# Patient Record
Sex: Male | Born: 1935 | Race: White | Hispanic: No | Marital: Married | Smoking: Former smoker
Health system: Southern US, Community
[De-identification: ages and names within clinical notes are randomized; demographics above are authoritative.]

## PROBLEM LIST (undated history)

## (undated) DIAGNOSIS — D649 Anemia, unspecified: Secondary | ICD-10-CM

## (undated) DIAGNOSIS — J209 Acute bronchitis, unspecified: Secondary | ICD-10-CM

## (undated) DIAGNOSIS — J449 Chronic obstructive pulmonary disease, unspecified: Secondary | ICD-10-CM

## (undated) DIAGNOSIS — I959 Hypotension, unspecified: Secondary | ICD-10-CM

## (undated) DIAGNOSIS — I2510 Atherosclerotic heart disease of native coronary artery without angina pectoris: Secondary | ICD-10-CM

## (undated) DIAGNOSIS — J8410 Pulmonary fibrosis, unspecified: Secondary | ICD-10-CM

## (undated) DIAGNOSIS — M069 Rheumatoid arthritis, unspecified: Secondary | ICD-10-CM

## (undated) DIAGNOSIS — J438 Other emphysema: Secondary | ICD-10-CM

## (undated) DIAGNOSIS — J4489 Other specified chronic obstructive pulmonary disease: Secondary | ICD-10-CM

## (undated) HISTORY — DX: Other emphysema: J43.8

## (undated) HISTORY — DX: Chronic obstructive pulmonary disease, unspecified: J44.9

## (undated) HISTORY — DX: Atherosclerotic heart disease of native coronary artery without angina pectoris: I25.10

## (undated) HISTORY — PX: OTHER SURGICAL HISTORY: SHX169

## (undated) HISTORY — DX: Acute bronchitis, unspecified: J20.9

## (undated) HISTORY — PX: FOOT SURGERY: SHX648

## (undated) HISTORY — DX: Rheumatoid arthritis, unspecified: M06.9

## (undated) HISTORY — DX: Other specified chronic obstructive pulmonary disease: J44.89

## (undated) HISTORY — DX: Pulmonary fibrosis, unspecified: J84.10

---

## 2000-10-14 ENCOUNTER — Ambulatory Visit: Admission: RE | Admit: 2000-10-14 | Discharge: 2000-10-14 | Payer: Self-pay | Admitting: Critical Care Medicine

## 2000-12-06 ENCOUNTER — Encounter: Admission: RE | Admit: 2000-12-06 | Discharge: 2000-12-06 | Payer: Self-pay

## 2001-01-22 DIAGNOSIS — I2510 Atherosclerotic heart disease of native coronary artery without angina pectoris: Secondary | ICD-10-CM

## 2001-01-22 HISTORY — PX: CORONARY ANGIOPLASTY WITH STENT PLACEMENT: SHX49

## 2001-01-22 HISTORY — DX: Atherosclerotic heart disease of native coronary artery without angina pectoris: I25.10

## 2001-07-24 ENCOUNTER — Inpatient Hospital Stay (HOSPITAL_COMMUNITY): Admission: EM | Admit: 2001-07-24 | Discharge: 2001-07-29 | Payer: Self-pay | Admitting: *Deleted

## 2002-01-22 HISTORY — PX: CARDIAC CATHETERIZATION: SHX172

## 2002-02-25 ENCOUNTER — Ambulatory Visit (HOSPITAL_COMMUNITY): Admission: RE | Admit: 2002-02-25 | Discharge: 2002-02-25 | Payer: Self-pay | Admitting: Cardiology

## 2002-07-07 ENCOUNTER — Ambulatory Visit (HOSPITAL_COMMUNITY): Admission: RE | Admit: 2002-07-07 | Discharge: 2002-07-07 | Payer: Self-pay | Admitting: Internal Medicine

## 2002-07-07 ENCOUNTER — Encounter (HOSPITAL_BASED_OUTPATIENT_CLINIC_OR_DEPARTMENT_OTHER): Payer: Self-pay | Admitting: Internal Medicine

## 2002-09-11 ENCOUNTER — Encounter: Payer: Self-pay | Admitting: Critical Care Medicine

## 2002-09-11 ENCOUNTER — Encounter: Admission: RE | Admit: 2002-09-11 | Discharge: 2002-09-11 | Payer: Self-pay | Admitting: Critical Care Medicine

## 2003-02-13 ENCOUNTER — Emergency Department (HOSPITAL_COMMUNITY): Admission: EM | Admit: 2003-02-13 | Discharge: 2003-02-13 | Payer: Self-pay | Admitting: Emergency Medicine

## 2003-04-09 ENCOUNTER — Encounter: Admission: RE | Admit: 2003-04-09 | Discharge: 2003-04-09 | Payer: Self-pay | Admitting: General Surgery

## 2004-02-09 ENCOUNTER — Ambulatory Visit (HOSPITAL_COMMUNITY): Admission: RE | Admit: 2004-02-09 | Discharge: 2004-02-09 | Payer: Self-pay | Admitting: Orthopedic Surgery

## 2004-04-11 ENCOUNTER — Ambulatory Visit: Payer: Self-pay | Admitting: Critical Care Medicine

## 2004-05-15 ENCOUNTER — Ambulatory Visit: Payer: Self-pay | Admitting: Critical Care Medicine

## 2004-08-17 ENCOUNTER — Ambulatory Visit: Payer: Self-pay | Admitting: Critical Care Medicine

## 2004-10-05 ENCOUNTER — Ambulatory Visit: Payer: Self-pay | Admitting: Critical Care Medicine

## 2004-11-30 ENCOUNTER — Ambulatory Visit: Payer: Self-pay | Admitting: Critical Care Medicine

## 2005-01-26 ENCOUNTER — Ambulatory Visit: Payer: Self-pay | Admitting: Critical Care Medicine

## 2005-03-27 ENCOUNTER — Ambulatory Visit: Payer: Self-pay | Admitting: Internal Medicine

## 2005-04-02 ENCOUNTER — Ambulatory Visit: Payer: Self-pay | Admitting: Internal Medicine

## 2005-04-11 ENCOUNTER — Ambulatory Visit: Payer: Self-pay | Admitting: Critical Care Medicine

## 2005-05-01 ENCOUNTER — Ambulatory Visit: Payer: Self-pay | Admitting: Critical Care Medicine

## 2005-05-15 ENCOUNTER — Ambulatory Visit: Payer: Self-pay | Admitting: Pulmonary Disease

## 2005-07-10 ENCOUNTER — Ambulatory Visit: Payer: Self-pay | Admitting: Critical Care Medicine

## 2005-07-30 ENCOUNTER — Ambulatory Visit: Payer: Self-pay | Admitting: Critical Care Medicine

## 2005-08-16 ENCOUNTER — Ambulatory Visit: Payer: Self-pay | Admitting: Critical Care Medicine

## 2005-09-18 ENCOUNTER — Ambulatory Visit: Payer: Self-pay | Admitting: Critical Care Medicine

## 2005-11-20 ENCOUNTER — Ambulatory Visit: Payer: Self-pay | Admitting: Critical Care Medicine

## 2006-02-22 ENCOUNTER — Ambulatory Visit: Payer: Self-pay | Admitting: Critical Care Medicine

## 2006-11-08 DIAGNOSIS — J449 Chronic obstructive pulmonary disease, unspecified: Secondary | ICD-10-CM

## 2006-11-08 DIAGNOSIS — M069 Rheumatoid arthritis, unspecified: Secondary | ICD-10-CM | POA: Insufficient documentation

## 2006-11-08 DIAGNOSIS — J849 Interstitial pulmonary disease, unspecified: Secondary | ICD-10-CM | POA: Insufficient documentation

## 2006-12-26 ENCOUNTER — Ambulatory Visit: Payer: Self-pay | Admitting: Critical Care Medicine

## 2007-04-24 ENCOUNTER — Encounter: Payer: Self-pay | Admitting: Critical Care Medicine

## 2007-05-07 ENCOUNTER — Encounter: Admission: RE | Admit: 2007-05-07 | Discharge: 2007-05-07 | Payer: Self-pay | Admitting: Cardiology

## 2007-05-08 ENCOUNTER — Ambulatory Visit (HOSPITAL_COMMUNITY): Admission: RE | Admit: 2007-05-08 | Discharge: 2007-05-08 | Payer: Self-pay | Admitting: Cardiology

## 2007-07-11 ENCOUNTER — Ambulatory Visit: Payer: Self-pay | Admitting: Critical Care Medicine

## 2007-07-11 DIAGNOSIS — I2510 Atherosclerotic heart disease of native coronary artery without angina pectoris: Secondary | ICD-10-CM | POA: Insufficient documentation

## 2007-07-14 ENCOUNTER — Ambulatory Visit: Payer: Self-pay | Admitting: Internal Medicine

## 2007-07-14 ENCOUNTER — Encounter: Payer: Self-pay | Admitting: Critical Care Medicine

## 2007-07-18 ENCOUNTER — Ambulatory Visit: Admission: RE | Admit: 2007-07-18 | Discharge: 2007-07-18 | Payer: Self-pay | Admitting: Critical Care Medicine

## 2007-07-18 ENCOUNTER — Encounter: Payer: Self-pay | Admitting: Internal Medicine

## 2007-07-21 ENCOUNTER — Encounter: Payer: Self-pay | Admitting: Critical Care Medicine

## 2007-07-23 ENCOUNTER — Encounter: Payer: Self-pay | Admitting: Critical Care Medicine

## 2007-08-22 ENCOUNTER — Ambulatory Visit: Payer: Self-pay | Admitting: Critical Care Medicine

## 2007-09-03 ENCOUNTER — Telehealth: Payer: Self-pay | Admitting: Internal Medicine

## 2007-09-05 DIAGNOSIS — E785 Hyperlipidemia, unspecified: Secondary | ICD-10-CM

## 2007-09-05 DIAGNOSIS — I252 Old myocardial infarction: Secondary | ICD-10-CM

## 2007-09-05 DIAGNOSIS — I10 Essential (primary) hypertension: Secondary | ICD-10-CM | POA: Insufficient documentation

## 2007-09-05 DIAGNOSIS — K648 Other hemorrhoids: Secondary | ICD-10-CM | POA: Insufficient documentation

## 2007-09-05 DIAGNOSIS — K5730 Diverticulosis of large intestine without perforation or abscess without bleeding: Secondary | ICD-10-CM | POA: Insufficient documentation

## 2007-09-10 ENCOUNTER — Ambulatory Visit: Payer: Self-pay | Admitting: Internal Medicine

## 2007-09-24 ENCOUNTER — Ambulatory Visit: Payer: Self-pay | Admitting: Critical Care Medicine

## 2007-10-08 ENCOUNTER — Encounter: Payer: Self-pay | Admitting: Critical Care Medicine

## 2007-10-13 ENCOUNTER — Ambulatory Visit: Payer: Self-pay | Admitting: Internal Medicine

## 2007-10-13 DIAGNOSIS — K5900 Constipation, unspecified: Secondary | ICD-10-CM | POA: Insufficient documentation

## 2007-11-18 ENCOUNTER — Ambulatory Visit: Payer: Self-pay | Admitting: Internal Medicine

## 2007-12-02 ENCOUNTER — Ambulatory Visit: Payer: Self-pay | Admitting: Critical Care Medicine

## 2007-12-17 ENCOUNTER — Inpatient Hospital Stay (HOSPITAL_COMMUNITY): Admission: EM | Admit: 2007-12-17 | Discharge: 2007-12-19 | Payer: Self-pay | Admitting: Emergency Medicine

## 2007-12-31 ENCOUNTER — Encounter: Payer: Self-pay | Admitting: Critical Care Medicine

## 2008-01-11 ENCOUNTER — Encounter: Payer: Self-pay | Admitting: Critical Care Medicine

## 2008-01-23 DIAGNOSIS — D649 Anemia, unspecified: Secondary | ICD-10-CM

## 2008-01-23 HISTORY — DX: Anemia, unspecified: D64.9

## 2008-01-28 ENCOUNTER — Ambulatory Visit: Payer: Self-pay | Admitting: Pulmonary Disease

## 2008-02-04 ENCOUNTER — Encounter: Admission: RE | Admit: 2008-02-04 | Discharge: 2008-02-04 | Payer: Self-pay | Admitting: Neurology

## 2008-02-16 ENCOUNTER — Encounter: Payer: Self-pay | Admitting: Critical Care Medicine

## 2008-02-18 ENCOUNTER — Telehealth: Payer: Self-pay | Admitting: Pulmonary Disease

## 2008-03-01 ENCOUNTER — Telehealth: Payer: Self-pay | Admitting: Pulmonary Disease

## 2008-03-01 ENCOUNTER — Encounter: Payer: Self-pay | Admitting: Critical Care Medicine

## 2008-03-03 ENCOUNTER — Encounter: Payer: Self-pay | Admitting: Pulmonary Disease

## 2008-03-05 ENCOUNTER — Ambulatory Visit: Payer: Self-pay | Admitting: Critical Care Medicine

## 2008-03-09 ENCOUNTER — Encounter: Payer: Self-pay | Admitting: Critical Care Medicine

## 2008-03-22 ENCOUNTER — Encounter: Payer: Self-pay | Admitting: Critical Care Medicine

## 2008-03-22 ENCOUNTER — Telehealth (INDEPENDENT_AMBULATORY_CARE_PROVIDER_SITE_OTHER): Payer: Self-pay | Admitting: *Deleted

## 2008-03-26 ENCOUNTER — Telehealth (INDEPENDENT_AMBULATORY_CARE_PROVIDER_SITE_OTHER): Payer: Self-pay | Admitting: *Deleted

## 2008-03-26 ENCOUNTER — Ambulatory Visit: Payer: Self-pay | Admitting: Critical Care Medicine

## 2008-03-26 DIAGNOSIS — R892 Abnormal level of other drugs, medicaments and biological substances in specimens from other organs, systems and tissues: Secondary | ICD-10-CM | POA: Insufficient documentation

## 2008-03-26 LAB — CONVERTED CEMR LAB
Albumin: 3.6 g/dL (ref 3.5–5.2)
Alkaline Phosphatase: 77 units/L (ref 39–117)
Basophils Absolute: 0 10*3/uL (ref 0.0–0.1)
Basophils Relative: 0.1 % (ref 0.0–3.0)
Eosinophils Absolute: 0.1 10*3/uL (ref 0.0–0.7)
MCHC: 34.1 g/dL (ref 30.0–36.0)
MCV: 95.3 fL (ref 78.0–100.0)
Neutrophils Relative %: 85.3 % — ABNORMAL HIGH (ref 43.0–77.0)
RBC: 4.6 M/uL (ref 4.22–5.81)
Rhuematoid fact SerPl-aCnc: 793.7 intl units/mL — ABNORMAL HIGH (ref 0.0–20.0)
Sed Rate: 16 mm/hr (ref 0–16)

## 2008-03-29 ENCOUNTER — Telehealth: Payer: Self-pay | Admitting: Critical Care Medicine

## 2008-03-29 ENCOUNTER — Telehealth (INDEPENDENT_AMBULATORY_CARE_PROVIDER_SITE_OTHER): Payer: Self-pay | Admitting: *Deleted

## 2008-03-30 ENCOUNTER — Encounter: Payer: Self-pay | Admitting: Critical Care Medicine

## 2008-04-01 ENCOUNTER — Telehealth: Payer: Self-pay | Admitting: Critical Care Medicine

## 2008-04-01 ENCOUNTER — Ambulatory Visit: Payer: Self-pay | Admitting: Critical Care Medicine

## 2008-04-01 ENCOUNTER — Ambulatory Visit: Payer: Self-pay | Admitting: Cardiovascular Disease

## 2008-04-05 ENCOUNTER — Encounter: Payer: Self-pay | Admitting: Critical Care Medicine

## 2008-04-05 LAB — CONVERTED CEMR LAB: Anti Nuclear Antibody(ANA): NEGATIVE

## 2008-04-09 ENCOUNTER — Ambulatory Visit: Payer: Self-pay | Admitting: Pulmonary Disease

## 2008-05-11 ENCOUNTER — Ambulatory Visit: Payer: Self-pay | Admitting: Critical Care Medicine

## 2008-05-13 LAB — CONVERTED CEMR LAB
ALT: 19 units/L (ref 0–53)
AST: 17 units/L (ref 0–37)
Bilirubin, Direct: 0.1 mg/dL (ref 0.0–0.3)
Eosinophils Relative: 0.5 % (ref 0.0–5.0)
Lymphocytes Relative: 5.2 % — ABNORMAL LOW (ref 12.0–46.0)
Monocytes Relative: 4 % (ref 3.0–12.0)
Neutrophils Relative %: 90 % — ABNORMAL HIGH (ref 43.0–77.0)
Platelets: 153 10*3/uL (ref 150.0–400.0)
Total Bilirubin: 0.7 mg/dL (ref 0.3–1.2)
WBC: 8.6 10*3/uL (ref 4.5–10.5)

## 2008-05-27 ENCOUNTER — Encounter: Payer: Self-pay | Admitting: Pulmonary Disease

## 2008-06-15 ENCOUNTER — Encounter: Payer: Self-pay | Admitting: Critical Care Medicine

## 2008-06-22 ENCOUNTER — Encounter (HOSPITAL_COMMUNITY): Admission: RE | Admit: 2008-06-22 | Discharge: 2008-08-22 | Payer: Self-pay | Admitting: Critical Care Medicine

## 2008-06-22 ENCOUNTER — Ambulatory Visit: Payer: Self-pay | Admitting: Critical Care Medicine

## 2008-06-25 LAB — CONVERTED CEMR LAB
Basophils Relative: 0.5 % (ref 0.0–3.0)
Bilirubin, Direct: 0.2 mg/dL (ref 0.0–0.3)
Eosinophils Absolute: 0.1 10*3/uL (ref 0.0–0.7)
HCT: 37.6 % — ABNORMAL LOW (ref 39.0–52.0)
Lymphs Abs: 0.8 10*3/uL (ref 0.7–4.0)
MCHC: 36 g/dL (ref 30.0–36.0)
MCV: 97.1 fL (ref 78.0–100.0)
Monocytes Absolute: 0.6 10*3/uL (ref 0.1–1.0)
Neutrophils Relative %: 78.9 % — ABNORMAL HIGH (ref 43.0–77.0)
Platelets: 181 10*3/uL (ref 150.0–400.0)
Total Bilirubin: 1 mg/dL (ref 0.3–1.2)
Total Protein: 6.5 g/dL (ref 6.0–8.3)

## 2008-08-23 ENCOUNTER — Encounter (HOSPITAL_COMMUNITY): Admission: RE | Admit: 2008-08-23 | Discharge: 2008-11-21 | Payer: Self-pay | Admitting: Critical Care Medicine

## 2008-09-07 ENCOUNTER — Ambulatory Visit: Payer: Self-pay | Admitting: Critical Care Medicine

## 2008-09-08 LAB — CONVERTED CEMR LAB
ALT: 15 units/L (ref 0–53)
AST: 21 units/L (ref 0–37)
Albumin: 3.6 g/dL (ref 3.5–5.2)
Alkaline Phosphatase: 64 units/L (ref 39–117)
Basophils Relative: 0.8 % (ref 0.0–3.0)
Bilirubin, Direct: 0.2 mg/dL (ref 0.0–0.3)
Eosinophils Absolute: 0.1 10*3/uL (ref 0.0–0.7)
Eosinophils Relative: 1 % (ref 0.0–5.0)
Hemoglobin: 13.3 g/dL (ref 13.0–17.0)
Lymphocytes Relative: 11.9 % — ABNORMAL LOW (ref 12.0–46.0)
MCHC: 35 g/dL (ref 30.0–36.0)
Neutro Abs: 4.8 10*3/uL (ref 1.4–7.7)
Neutrophils Relative %: 80.4 % — ABNORMAL HIGH (ref 43.0–77.0)
RBC: 3.72 M/uL — ABNORMAL LOW (ref 4.22–5.81)
Total Protein: 6.4 g/dL (ref 6.0–8.3)
WBC: 5.9 10*3/uL (ref 4.5–10.5)

## 2008-10-13 ENCOUNTER — Encounter: Payer: Self-pay | Admitting: Critical Care Medicine

## 2008-10-22 ENCOUNTER — Encounter (HOSPITAL_COMMUNITY): Admission: RE | Admit: 2008-10-22 | Discharge: 2009-01-18 | Payer: Self-pay | Admitting: Critical Care Medicine

## 2008-10-25 ENCOUNTER — Ambulatory Visit: Payer: Self-pay | Admitting: Oncology

## 2008-10-25 LAB — IRON AND TIBC
%SAT: 26 % (ref 20–55)
Iron: 63 ug/dL (ref 42–165)
TIBC: 241 ug/dL (ref 215–435)

## 2008-10-25 LAB — CBC WITH DIFFERENTIAL (CANCER CENTER ONLY)
BASO#: 0 10*3/uL (ref 0.0–0.2)
EOS%: 2 % (ref 0.0–7.0)
HCT: 36.1 % — ABNORMAL LOW (ref 38.7–49.9)
HGB: 12.3 g/dL — ABNORMAL LOW (ref 13.0–17.1)
LYMPH#: 0.6 10*3/uL — ABNORMAL LOW (ref 0.9–3.3)
MCHC: 34 g/dL (ref 32.0–35.9)
MONO#: 0.2 10*3/uL (ref 0.1–0.9)
NEUT%: 80 % (ref 40.0–80.0)

## 2008-10-25 LAB — CMP (CANCER CENTER ONLY)
CO2: 31 mEq/L (ref 18–33)
Creat: 1.3 mg/dl — ABNORMAL HIGH (ref 0.6–1.2)
Glucose, Bld: 139 mg/dL — ABNORMAL HIGH (ref 73–118)
Total Bilirubin: 0.5 mg/dl (ref 0.20–1.60)

## 2008-10-25 LAB — FERRITIN: Ferritin: 451 ng/mL — ABNORMAL HIGH (ref 22–322)

## 2008-10-27 LAB — PROTEIN ELECTROPHORESIS, SERUM
Albumin ELP: 60.2 % (ref 55.8–66.1)
Alpha-1-Globulin: 5.5 % — ABNORMAL HIGH (ref 2.9–4.9)
Beta 2: 5.2 % (ref 3.2–6.5)
Beta Globulin: 6.3 % (ref 4.7–7.2)

## 2008-10-27 LAB — ERYTHROPOIETIN: Erythropoietin: 21.8 m[IU]/mL (ref 2.6–34.0)

## 2008-11-04 ENCOUNTER — Ambulatory Visit: Payer: Self-pay | Admitting: Internal Medicine

## 2008-11-04 DIAGNOSIS — K219 Gastro-esophageal reflux disease without esophagitis: Secondary | ICD-10-CM

## 2008-11-10 ENCOUNTER — Ambulatory Visit: Payer: Self-pay | Admitting: Critical Care Medicine

## 2008-11-22 LAB — CBC WITH DIFFERENTIAL (CANCER CENTER ONLY)
BASO#: 0 10*3/uL (ref 0.0–0.2)
EOS%: 4.4 % (ref 0.0–7.0)
Eosinophils Absolute: 0.2 10*3/uL (ref 0.0–0.5)
HCT: 34.4 % — ABNORMAL LOW (ref 38.7–49.9)
HGB: 11.8 g/dL — ABNORMAL LOW (ref 13.0–17.1)
LYMPH#: 0.7 10*3/uL — ABNORMAL LOW (ref 0.9–3.3)
MCHC: 34.3 g/dL (ref 32.0–35.9)
NEUT%: 72.8 % (ref 40.0–80.0)
RBC: 3.27 10*6/uL — ABNORMAL LOW (ref 4.20–5.70)

## 2008-12-14 ENCOUNTER — Encounter: Payer: Self-pay | Admitting: Critical Care Medicine

## 2008-12-20 ENCOUNTER — Telehealth (INDEPENDENT_AMBULATORY_CARE_PROVIDER_SITE_OTHER): Payer: Self-pay | Admitting: *Deleted

## 2009-01-19 ENCOUNTER — Ambulatory Visit: Payer: Self-pay | Admitting: Critical Care Medicine

## 2009-02-14 ENCOUNTER — Encounter: Payer: Self-pay | Admitting: Critical Care Medicine

## 2009-04-12 ENCOUNTER — Encounter: Payer: Self-pay | Admitting: Critical Care Medicine

## 2009-04-13 ENCOUNTER — Ambulatory Visit: Payer: Self-pay | Admitting: Internal Medicine

## 2009-04-14 LAB — CONVERTED CEMR LAB
ALT: 13 units/L (ref 0–53)
Alkaline Phosphatase: 95 units/L (ref 39–117)
BUN: 16 mg/dL (ref 6–23)
Basophils Relative: 0.4 % (ref 0.0–3.0)
Bilirubin, Direct: 0.1 mg/dL (ref 0.0–0.3)
Calcium: 9.2 mg/dL (ref 8.4–10.5)
Creatinine, Ser: 1.3 mg/dL (ref 0.4–1.5)
Eosinophils Relative: 3.4 % (ref 0.0–5.0)
GFR calc non Af Amer: 57.12 mL/min (ref >60)
Lymphocytes Relative: 8.5 % — ABNORMAL LOW (ref 12.0–46.0)
MCV: 108.2 fL — ABNORMAL HIGH (ref 78.0–100.0)
Monocytes Absolute: 0.5 10*3/uL (ref 0.1–1.0)
Neutrophils Relative %: 76.1 % (ref 43.0–77.0)
Platelets: 199 10*3/uL (ref 150.0–400.0)
RBC: 3.23 M/uL — ABNORMAL LOW (ref 4.22–5.81)
Sed Rate: 85 mm/hr — ABNORMAL HIGH (ref 0–22)
Total Bilirubin: 0.4 mg/dL (ref 0.3–1.2)
Total Protein: 7.3 g/dL (ref 6.0–8.3)
WBC: 4.7 10*3/uL (ref 4.5–10.5)

## 2009-04-27 ENCOUNTER — Ambulatory Visit: Payer: Self-pay | Admitting: Critical Care Medicine

## 2009-04-28 ENCOUNTER — Ambulatory Visit: Payer: Self-pay | Admitting: Cardiology

## 2009-04-28 ENCOUNTER — Inpatient Hospital Stay (HOSPITAL_COMMUNITY): Admission: EM | Admit: 2009-04-28 | Discharge: 2009-04-29 | Payer: Self-pay | Admitting: Emergency Medicine

## 2009-05-02 ENCOUNTER — Telehealth (INDEPENDENT_AMBULATORY_CARE_PROVIDER_SITE_OTHER): Payer: Self-pay | Admitting: *Deleted

## 2009-05-20 ENCOUNTER — Telehealth (INDEPENDENT_AMBULATORY_CARE_PROVIDER_SITE_OTHER): Payer: Self-pay | Admitting: *Deleted

## 2009-05-23 ENCOUNTER — Telehealth (INDEPENDENT_AMBULATORY_CARE_PROVIDER_SITE_OTHER): Payer: Self-pay | Admitting: *Deleted

## 2009-06-06 ENCOUNTER — Encounter: Payer: Self-pay | Admitting: Critical Care Medicine

## 2009-06-14 ENCOUNTER — Encounter: Payer: Self-pay | Admitting: Critical Care Medicine

## 2009-06-21 ENCOUNTER — Ambulatory Visit: Payer: Self-pay | Admitting: Critical Care Medicine

## 2009-06-21 ENCOUNTER — Telehealth: Payer: Self-pay | Admitting: Critical Care Medicine

## 2009-06-21 LAB — CONVERTED CEMR LAB
ALT: 13 units/L (ref 0–53)
AST: 20 units/L (ref 0–37)
Albumin: 3.7 g/dL (ref 3.5–5.2)
Eosinophils Relative: 4.7 % (ref 0.0–5.0)
HCT: 29.3 % — ABNORMAL LOW (ref 39.0–52.0)
Hemoglobin: 10.2 g/dL — ABNORMAL LOW (ref 13.0–17.0)
Lymphs Abs: 0.4 10*3/uL — ABNORMAL LOW (ref 0.7–4.0)
MCV: 106.5 fL — ABNORMAL HIGH (ref 78.0–100.0)
Monocytes Absolute: 0.5 10*3/uL (ref 0.1–1.0)
Monocytes Relative: 10.9 % (ref 3.0–12.0)
Neutro Abs: 3.4 10*3/uL (ref 1.4–7.7)
Platelets: 243 10*3/uL (ref 150.0–400.0)
RDW: 17.3 % — ABNORMAL HIGH (ref 11.5–14.6)
WBC: 4.5 10*3/uL (ref 4.5–10.5)

## 2009-06-23 ENCOUNTER — Telehealth: Payer: Self-pay | Admitting: Critical Care Medicine

## 2009-06-30 ENCOUNTER — Encounter: Payer: Self-pay | Admitting: Critical Care Medicine

## 2009-07-04 ENCOUNTER — Telehealth (INDEPENDENT_AMBULATORY_CARE_PROVIDER_SITE_OTHER): Payer: Self-pay | Admitting: *Deleted

## 2009-07-05 ENCOUNTER — Encounter: Payer: Self-pay | Admitting: Critical Care Medicine

## 2009-07-06 ENCOUNTER — Telehealth: Payer: Self-pay | Admitting: Critical Care Medicine

## 2009-07-07 ENCOUNTER — Ambulatory Visit: Payer: Self-pay | Admitting: Critical Care Medicine

## 2009-08-17 ENCOUNTER — Ambulatory Visit: Payer: Self-pay | Admitting: Critical Care Medicine

## 2009-10-11 ENCOUNTER — Encounter: Payer: Self-pay | Admitting: Critical Care Medicine

## 2009-10-24 ENCOUNTER — Ambulatory Visit: Payer: Self-pay | Admitting: Critical Care Medicine

## 2009-10-24 DIAGNOSIS — G4700 Insomnia, unspecified: Secondary | ICD-10-CM

## 2009-11-15 ENCOUNTER — Telehealth: Payer: Self-pay | Admitting: Critical Care Medicine

## 2009-11-15 ENCOUNTER — Ambulatory Visit: Payer: Self-pay | Admitting: Critical Care Medicine

## 2009-11-15 LAB — CONVERTED CEMR LAB
Basophils Relative: 0.3 % (ref 0.0–3.0)
Bilirubin, Direct: 0.1 mg/dL (ref 0.0–0.3)
Eosinophils Relative: 3.6 % (ref 0.0–5.0)
HCT: 29.9 % — ABNORMAL LOW (ref 39.0–52.0)
Hemoglobin: 10.4 g/dL — ABNORMAL LOW (ref 13.0–17.0)
Lymphs Abs: 0.4 10*3/uL — ABNORMAL LOW (ref 0.7–4.0)
MCHC: 34.8 g/dL (ref 30.0–36.0)
MCV: 110.5 fL — ABNORMAL HIGH (ref 78.0–100.0)
Monocytes Absolute: 0.6 10*3/uL (ref 0.1–1.0)
Neutro Abs: 3.1 10*3/uL (ref 1.4–7.7)
RBC: 2.69 M/uL — ABNORMAL LOW (ref 4.22–5.81)
Total Bilirubin: 0.5 mg/dL (ref 0.3–1.2)
WBC: 4.2 10*3/uL — ABNORMAL LOW (ref 4.5–10.5)

## 2009-12-07 ENCOUNTER — Encounter: Payer: Self-pay | Admitting: Critical Care Medicine

## 2010-02-07 ENCOUNTER — Telehealth: Payer: Self-pay | Admitting: Critical Care Medicine

## 2010-02-09 ENCOUNTER — Encounter: Payer: Self-pay | Admitting: Critical Care Medicine

## 2010-02-12 ENCOUNTER — Encounter: Payer: Self-pay | Admitting: Cardiology

## 2010-02-21 ENCOUNTER — Ambulatory Visit
Admission: RE | Admit: 2010-02-21 | Discharge: 2010-02-21 | Payer: Self-pay | Source: Home / Self Care | Attending: Critical Care Medicine | Admitting: Critical Care Medicine

## 2010-02-21 ENCOUNTER — Other Ambulatory Visit: Payer: Self-pay | Admitting: Critical Care Medicine

## 2010-02-21 LAB — CBC WITH DIFFERENTIAL/PLATELET
Basophils Absolute: 0 10*3/uL (ref 0.0–0.1)
Basophils Relative: 0.1 % (ref 0.0–3.0)
Eosinophils Absolute: 0.2 10*3/uL (ref 0.0–0.7)
Eosinophils Relative: 4.1 % (ref 0.0–5.0)
HCT: 30.4 % — ABNORMAL LOW (ref 39.0–52.0)
Hemoglobin: 10.7 g/dL — ABNORMAL LOW (ref 13.0–17.0)
Lymphocytes Relative: 13.1 % (ref 12.0–46.0)
Lymphs Abs: 0.6 10*3/uL — ABNORMAL LOW (ref 0.7–4.0)
MCHC: 35.2 g/dL (ref 30.0–36.0)
MCV: 109.3 fl — ABNORMAL HIGH (ref 78.0–100.0)
Monocytes Absolute: 0.5 10*3/uL (ref 0.1–1.0)
Monocytes Relative: 12 % (ref 3.0–12.0)
Neutro Abs: 3.1 10*3/uL (ref 1.4–7.7)
Neutrophils Relative %: 70.7 % (ref 43.0–77.0)
Platelets: 200 10*3/uL (ref 150.0–400.0)
RBC: 2.78 Mil/uL — ABNORMAL LOW (ref 4.22–5.81)
RDW: 15.7 % — ABNORMAL HIGH (ref 11.5–14.6)
WBC: 4.3 10*3/uL — ABNORMAL LOW (ref 4.5–10.5)

## 2010-02-21 NOTE — Assessment & Plan Note (Signed)
Summary: Acute NP office visit - changes in breathing   Copy to:  Rheum Zenovia Jordan Primary Provider/Referring Provider:  Dr. Renford Dills  CC:   constant heartburn and constipation and difficulty sleeping x32months.  History of Present Illness: 63 yowm quit smoker around 1980  history of chronic obstructive airways disease exacerbated by chronic rheumatoid arthritis, associated pulmonary fibrosis,  on 02 since 2008   April 27, 2009 3:18 PM Pt noting c/o electric shock in shoulder.  The pt  is not sleeping  only 4hrs/night,   The R chest wall feels like sand paper.  Actonel was stopped due to concern of GERD at 3/23 OV.  ESR last OV was elevated  There is no change in cough,  not as active, more fatigued.  The  heartburn is worse and is using prevacid.  Pt still with heaviness in chest.   Fatigue is the largest complaint.      Note that later in the evening after this OV the pt went to [facility] ED and admitted 4/6 -4/8 for r/o MI.  All enzymes were negative.  Cxr stable KUB showed severe obstipation and ileus.  Pt d/c on miralax for bowels.  Pt did not relate to me the no bms for 7-10days with the earlier OV   Jun 21, 2009  Pt now noting wheezing and dyspnea worsening.   Notes more dyspnea with any activity.  More mucus and is yellow and pasty.  Mucus is thick and hard to raise.  No real chest pain.  Sl edema in L foot.  Bowels ok now.  Pt was in hosp 4/6 -4/8 for obstipation and AMI was ruled out.   During 4/11 hosp stay : WBC 5.0 and LFTs normal 4/11.  CXR was stable with pulm fibrosis 4/11  . Pt remains on Spiriva and Qvar.  Pt is not on any prednisone.  Pt is on 200mg  /d azathioprine.  07/07/09--Presents for an acute office visit. Complains of swelling in left foot/ankle and calf, occ shooting pain in arch of foot x2weeks. Pt complains that his left foot gets puffy in afternoons , bottom of foot hurts from time to time. Gets mild swelling in right but minimall, not as bad on left. No new  meds, injury , recent travel of change in breathing.     August 17, 2009 9:39 AM The pt now notes some yellow mucus  sl more than before.  No chest pain.  Dyspnea is the same, not able to exert self.   No new issues. Edema is better with support hose.  No chest pain.      October 24, 2009 --Pt presents for work in viisit. He has several issues today. Says his breathing is at baseline. Complains of constant heartburn with belching and reflux. Uses tums with some help but keeps coming back. Has daily constipation with hard stools. Uses miralax and senokot as needed. He does have chronic insominia and uses lunesta and melatonin at bedtime , Has also been taking his wife;s ambien -takes 20mg  in addition to lunesta and melatonin. I advised him in the dangers of combining sleep aides. and we discussed several options. Have advised him to disucss this with his PCP for further recommendations. Denies chest pain, dyspnea, orthopnea, hemoptysis, fever, n/v/d, edema, headache,bloody stools, weight loss, syncope, daytime hypersolomennece.  He has tapered off elavil 2-3 weeks ago, does not want to restart , advised to discuss with Dr. Nehemiah Settle.   Medications Prior to Update: 1)  Toprol Xl 50 Mg  Tb24 (Metoprolol Succinate) .... Take 1 Tablet By Mouth Once A Day 2)  Adult Aspirin Low Strength 81 Mg  Tbdp (Aspirin) .... Take 1 Tablet By Mouth Once A Day 3)  Amitriptyline Hcl 50 Mg Tabs (Amitriptyline Hcl) .... Take 1 Tablet By Mouth Once A Day 4)  Avapro 150 Mg Tabs (Irbesartan) .[name] Kitchen.. 1 By Mouth Daily 5)  Lunesta 3 Mg Tabs (Eszopiclone) .... Take 1 Tab By Mouth At Bedtime 6)  Zocor 40 Mg Tabs (Simvastatin) .... Once Daily 7)  Anti-Oxidant   Tabs (Multiple Vitamin) .... Once Daily 8)  Calcium 600 1500 Mg  Tabs (Calcium Carbonate) .... Take 1 Am and At Bedtime 9)  Zetia 10 Mg Tabs (Ezetimibe) .[name] Kitchen.. 1 By Mouth Daily 10)  Spiriva Handihaler 18 Mcg Caps (Tiotropium Bromide Monohydrate) .[name] Kitchen.. 1 Capsule Once Daily 11)  Qvar  80 Mcg/act Aers (Beclomethasone Dipropionate) .... 2  Puffs Two Times A Day 12)  Oxygen .[name] Kitchen.. 4l Rest/exertion 13)  Azathioprine 50 Mg Tabs (Azathioprine) .... Take 4 By Mouth Daily 14)  Melatonin 3 Mg Tabs (Melatonin) .... 3 At Bedtime As Needed 15)  Actonel 35 Mg Tabs (Risedronate Sodium) .... Once Weekly  Allergies (verified): No Known Drug Allergies  Past History:  Past Medical History: Last updated: 04/13/2009 CORONARY HEART DISEASE (ICD-414.00) AMI 2004 with stents ARTHRITIS, RHEUMATOID (ICD-714.0) EMPHYSEMA (ICD-492.8) PULMONARY FIBROSIS (ICD-515)    -TLC 69%  DLCO 68% FeV1 97%  2009    -TLC 74%  DLCO 66% FeV1 83% 2010    -CT Chest :UIP and paraseptal centrilobular emphysema 2009    -CT Chest :  03/2008 UIP and paraseptal centrilobular emphysema sl worse compared to 2009    - walk: 1700 ft,  no desat HX OFASTHMATIC BRONCHITIS, ACUTE (ICD-466.0) COPD (ICD-496)  Past Surgical History: Last updated: 09/10/2007 stents with AMI 2004 Left foot surgery  Vericose vein surgery  Family History: Last updated: 11/18/2007 sister breast ca colon ca father in his 14's AMI mother Pulmonary Fibrosis-brother passed away 10/28/22  Social History: Last updated: 11/18/2007 Patient states former smoker. Quit approx 30yrs ago. Smoked x 30 yrs upto 3ppd. Pt is married. Pt is retired Medical illustrator. Alcohol Use - yes Daily Caffeine Use Patient is a former smoker.   Risk Factors: Alcohol Use: 1 (08/17/2009) Caffeine Use: 4 (09/10/2007)  Risk Factors: Smoking Status: quit > 6 months (08/17/2009)  Past Pulmonary History:  Pulmonary History: EMPHYSEMA (ICD-492.8) PULMONARY FIBROSIS (ICD-515)    -TLC 69%  DLCO 68% FeV1 97%  2009    -TLC 74%  DLCO 66% FeV1 83% 2010    -CT Chest :UIP and paraseptal centrilobular emphysema 2009    -CT Chest :  03/2008 UIP and paraseptal centrilobular emphysema sl worse compared to 2009    - walk: 1700 ft,  no desat HX OFASTHMATIC BRONCHITIS, ACUTE  (ICD-466.0) COPD (ICD-496)  Review of Systems      See HPI  Vital Signs:  Patient profile:   77 year old male Height:      73 inches Weight:      178.25 pounds BMI:     23.60 O2 Sat:      92 % on 4 L/min pulsing Temp:     97.0 degrees F oral Pulse rate:   68 / minute BP sitting:   132 / 70  (right arm) Cuff size:   regular  Vitals Entered By: Boone Master CNA/MA (October 24, 2009 9:32 AM)  O2 Flow:  4 L/min  pulsing CC:  constant heartburn, constipation and difficulty sleeping x45months Is Patient Diabetic? No Comments Medications reviewed with patient Daytime contact number verified with patient. Boone Master CNA/MA  October 24, 2009 9:33 AM    Physical Exam  Additional Exam:  Gen: Pleasant, in no distress , normal affect ENT: no lesions, no post nasal drip Neck: No JVD, no TMG, no carotid bruits Lungs: No use of accessory muscles, no dullness to percussion, dry bibasilar rales no rhonchi now ausculted Cardiovascular: RRR, heart sounds normal, no murmurs or gallops, tr to none  edema on left, none on right, scattered varicosities esp along left lower leg/ankle. venous insufficiency changes. neg homans sign, neg calf pain.  Abdomen: soft and non-tender, no HSM, BS normal, no guarding or rebound.  Musculoskeletal: No deformities, no cyanosis or clubbing Neuro: alert, non-focal     Impression & Recommendations:  Problem # 1:  GERD (ICD-530.81)  GERD diet  Plan  GERD Diet.  Nexium 40mg  once daily  Add Pepcid 20mg  at bedtime  Hold Actonel for now.  High fiber diet Stool softner 2 at bedtime  Miralax once daily for constipation.  Drink plenty of water/fluids.  follow up Dr. Delford Field in 2 weeks and as needed  follow up Dr. Nehemiah Settle to discuss your insomnia.  His updated medication list for this problem includes:    Nexium 40 Mg Cpdr (Esomeprazole magnesium) .[name] Kitchen... 1 by mouth once daily  Orders: Est. Patient Level IV (95621)  Problem # 2:  CONSTIPATION  (ICD-564.00) plan GERD Diet.  Nexium 40mg  once daily  Add Pepcid 20mg  at bedtime  Hold Actonel for now.  High fiber diet Stool softner 2 at bedtime  Miralax once daily for constipation.  Drink plenty of water/fluids.  follow up Dr. Delford Field in 2 weeks and as needed  follow up Dr. Nehemiah Settle to discuss your insomnia.   Problem # 3:  INSOMNIA (ICD-780.52)  advised on healthy sleep.  advised againtst using others meds and not combining meds to discuss w/ Dr. Nehemiah Settle.  His updated medication list for this problem includes:    Lunesta 3 Mg Tabs (Eszopiclone) .[name] Kitchen... Take 1 tab by mouth at bedtime  Orders: Est. Patient Level IV (30865)  Problem # 4:  PULMONARY FIBROSIS (ICD-515) stable   Medications Added to Medication List This Visit: 1)  Nexium 40 Mg Cpdr (Esomeprazole magnesium) .[name] Kitchen.. 1 by mouth once daily  Complete Medication List: 1)  Toprol Xl 50 Mg Tb24 (Metoprolol succinate) .... Take 1 tablet by mouth once a day 2)  Adult Aspirin Low Strength 81 Mg Tbdp (Aspirin) .... Take 1 tablet by mouth once a day 3)  Avapro 150 Mg Tabs (Irbesartan) .[name] Kitchen.. 1 by mouth daily 4)  Lunesta 3 Mg Tabs (Eszopiclone) .... Take 1 tab by mouth at bedtime 5)  Zocor 40 Mg Tabs (Simvastatin) .... Once daily 6)  Anti-oxidant Tabs (Multiple vitamin) .... Once daily 7)  Calcium 600 1500 Mg Tabs (Calcium carbonate) .... Take 1 am and at bedtime 8)  Zetia 10 Mg Tabs (Ezetimibe) .[name] Kitchen.. 1 by mouth daily 9)  Spiriva Handihaler 18 Mcg Caps (Tiotropium bromide monohydrate) .[name] Kitchen.. 1 capsule once daily 10)  Qvar 80 Mcg/act Aers (Beclomethasone dipropionate) .... 2  puffs two times a day 11)  Oxygen  .[name] Kitchen.. 4l rest/exertion 12)  Azathioprine 50 Mg Tabs (Azathioprine) .... Take 4 by mouth daily 13)  Melatonin 3 Mg Tabs (Melatonin) .... 3 at bedtime as needed 14)  Actonel 35 Mg Tabs (Risedronate sodium) .... Once  weekly 15)  Nexium 40 Mg Cpdr (Esomeprazole magnesium) .[name] Kitchen.. 1 by mouth once daily  Patient Instructions: 1)  GERD  Diet.  2)  Nexium 40mg  once daily  3)  Add Pepcid 20mg  at bedtime  4)  Hold Actonel for now.  5)  High fiber diet 6)  Stool softner 2 at bedtime  7)  Miralax once daily for constipation.  8)  Drink plenty of water/fluids.  9)  follow up Dr. Delford Field in 2 weeks and as needed  10)  follow up Dr. Nehemiah Settle to discuss your insomnia.  Prescriptions: NEXIUM 40 MG CPDR (ESOMEPRAZOLE MAGNESIUM) 1 by mouth once daily  #30 x 5   Entered and Authorized by:   Rubye Oaks NP   Signed by:   Rubye Oaks NP on 10/24/2009   Method used:   Electronically to        [facility] [address] (retail)       [address]       [place], [place]  [address]       Ph: [phone] or [phone]       Fax: [phone]   RxID:   [id number]    Immunization History:  Influenza Immunization History:    Influenza:  historical (11/22/2008)   Appended Document: Orders Update     Clinical Lists Changes  Orders: Added new Service order of Flu Vaccine 69yrs + MEDICARE PATIENTS (U7253) - Signed Added new Service order of Administration Flu vaccine - MCR (G6440) - Signed Observations: Added new observation of FLU VAX VIS: 08/16/2009 version (10/24/2009 10:53) Added new observation of FLU VAXLOT: [id number] (10/24/2009 10:53) Added new observation of FLU VAXMFR: [employer] (10/24/2009 10:53) Added new observation of FLU VAX EXP: 07/22/2010 (10/24/2009 10:53) Added new observation of FLU VAX DSE: 0.78ml (10/24/2009 10:53) Added new observation of FLU VAX: Fluvax 3+ (10/24/2009 10:53)          Flu Vaccine Consent Questions     Do you have a history of severe allergic reactions to this vaccine? no    Any prior history of allergic reactions to egg and/or gelatin? no    Do you have a sensitivity to the preservative Thimersol? no    Do you have a past history of Guillan-Barre Syndrome? no    Do you currently have an acute febrile illness? no    Have you  ever had a severe reaction to latex? no    Vaccine information given and explained to patient? yes    Are you currently pregnant? no    Lot Number:[id number]   Exp Date:07/22/2010   Site Given  Left Deltoid IMmedflu Zackery Barefoot CMA  October 24, 2009 10:54 AM   Appended Document: Acute NP office visit - changes in breathing agree  Appended Document: Acute NP office visit - changes in breathing office note printed off and faxed to Dr. Idelle Crouch office @ [phone].

## 2010-02-21 NOTE — Progress Notes (Signed)
Summary: Alfonso Patten 3mg  Prior Auth  Phone Note Outgoing Call   Call placed by: Gweneth Dimitri RN,  July 04, 2009 4:07 PM Summary of Call: Received request from Karin Golden to initiate prior auth for Lunesta 3mg .  [employer], spoke with [place].  Per Marchelle Folks, no proir Berkley Harvey is needed for Lunesta 39m once daily.  States pt can have this rx filled on July 4th with no prior auth needed.  Called [employer], spoke with [place].  Informed her of this.  She verbalized understanding. Initial call taken by: Gweneth Dimitri RN,  July 04, 2009 4:12 PM

## 2010-02-21 NOTE — Progress Notes (Signed)
Summary: Lab results  Phone Note Outgoing Call   Reason for Call: Discuss lab or test results Summary of Call: call pt and tell him labs are all ok  Initial call taken by: Storm Frisk MD,  November 15, 2009 2:21 PM  Follow-up for Phone Call        Called, spoke with pt.  He is aware labs ok per PW and verbalized understanding. Gweneth Dimitri RN  November 16, 2009 8:55 AM

## 2010-02-21 NOTE — Assessment & Plan Note (Signed)
Summary: Pulmonary OV   Copy to:  Rheum Zenovia Jordan Primary Stellarose Cerny/Referring Jamal Haskin:  Dr. Renford Dills  CC:  Follow up.  Breathing "about the same."  Cough - rarely prod with yellow mucus..  History of Present Illness: 38 yowm quit smoker around 1980  history of chronic obstructive airways disease exacerbated by chronic rheumatoid arthritis, associated pulmonary fibrosis,  on 02 since 2008   August 17, 2009 9:39 AM The pt now notes some yellow mucus  sl more than before.  No chest pain.  Dyspnea is the same, not able to exert self.   No new issues. Edema is better with support hose.  No chest pain.      October 24, 2009 --Pt presents for work in viisit. He has several issues today. Says his breathing is at baseline. Complains of constant heartburn with belching and reflux. Uses tums with some help but keeps coming back. Has daily constipation with hard stools. Uses miralax and senokot as needed. He does have chronic insominia and uses lunesta and melatonin at bedtime , Has also been taking his wife;s ambien -takes 20mg  in addition to lunesta and melatonin. I advised him in the dangers of combining sleep aides. and we discussed several options. Have advised him to disucss this with his PCP for further recommendations. Denies chest pain, dyspnea, orthopnea, hemoptysis, fever, n/v/d, edema, headache,bloody stools, weight loss, syncope, daytime hypersolomennece.  He has tapered off elavil 2-3 weeks ago, does not want to restart , advised to discuss with Dr. Nehemiah Settle.   November 15, 2009 10:42 AM The pt over planted in the yard    and is weak but at baseline.  Not much cough now.  No real wheeze.  Not much heartburn,  and constipation is better and this helps.  No new issues. Pt denies any significant sore throat, nasal congestion or excess secretions, fever, chills, sweats, unintended weight loss, pleurtic or exertional chest pain, orthopnea PND, or leg swelling   Preventive  Screening-Counseling & Management  Alcohol-Tobacco     Alcohol drinks/day: 1     Alcohol type: beer     Smoking Status: quit > 6 months     Year Quit: 1994  Current Medications (verified): 1)  Toprol Xl 50 Mg  Tb24 (Metoprolol Succinate) .... Take 1 Tablet By Mouth Once A Day 2)  Adult Aspirin Low Strength 81 Mg  Tbdp (Aspirin) .... Take 1 Tablet By Mouth Once A Day 3)  Avapro 150 Mg Tabs (Irbesartan) .[name] Kitchen.. 1 By Mouth Daily 4)  Lunesta 3 Mg Tabs (Eszopiclone) .... Take 1 Tab By Mouth At Bedtime 5)  Zocor 40 Mg Tabs (Simvastatin) .... Once Daily 6)  Anti-Oxidant   Tabs (Multiple Vitamin) .... Once Daily 7)  Calcium 600 1500 Mg  Tabs (Calcium Carbonate) .... Take 1 Am and At Bedtime 8)  Zetia 10 Mg Tabs (Ezetimibe) .[name] Kitchen.. 1 By Mouth Daily 9)  Spiriva Handihaler 18 Mcg Caps (Tiotropium Bromide Monohydrate) .[name] Kitchen.. 1 Capsule Once Daily 10)  Qvar 80 Mcg/act Aers (Beclomethasone Dipropionate) .... 2  Puffs Two Times A Day 11)  Oxygen .[name] Kitchen.. 4l Rest/exertion 12)  Azathioprine 50 Mg Tabs (Azathioprine) .... Take 4 By Mouth Daily 13)  Nexium 40 Mg Cpdr (Esomeprazole Magnesium) .[name] Kitchen.. 1 By Mouth Once Daily 14)  Mirtazapine 15 Mg Tabs (Mirtazapine) .... Take 1 Tab By Mouth At Bedtime  Allergies (verified): No Known Drug Allergies  Past History:  Past medical, surgical, family and social histories (including risk factors) reviewed, and no  changes noted (except as noted below).  Past Medical History: Reviewed history from 04/13/2009 and no changes required. CORONARY HEART DISEASE (ICD-414.00) AMI 2004 with stents ARTHRITIS, RHEUMATOID (ICD-714.0) EMPHYSEMA (ICD-492.8) PULMONARY FIBROSIS (ICD-515)    -TLC 69%  DLCO 68% FeV1 97%  2009    -TLC 74%  DLCO 66% FeV1 83% 2010    -CT Chest :UIP and paraseptal centrilobular emphysema 2009    -CT Chest :  03/2008 UIP and paraseptal centrilobular emphysema sl worse compared to 2009    - walk: 1700 ft,  no desat HX OFASTHMATIC BRONCHITIS, ACUTE  (ICD-466.0) COPD (ICD-496)  Past Surgical History: Reviewed history from 09/10/2007 and no changes required. stents with AMI 2004 Left foot surgery  Vericose vein surgery  Past Pulmonary History:  Pulmonary History: EMPHYSEMA (ICD-492.8) PULMONARY FIBROSIS (ICD-515)    -TLC 69%  DLCO 68% FeV1 97%  2009    -TLC 74%  DLCO 66% FeV1 83% 2010    -CT Chest :UIP and paraseptal centrilobular emphysema 2009    -CT Chest :  03/2008 UIP and paraseptal centrilobular emphysema sl worse compared to 2009    - walk: 1700 ft,  no desat HX OFASTHMATIC BRONCHITIS, ACUTE (ICD-466.0) COPD (ICD-496)  Family History: Reviewed history from 11/18/2007 and no changes required. sister breast ca colon ca father in his 61's AMI mother Pulmonary Fibrosis-brother passed away 10-29-2022  Social History: Reviewed history from 11/18/2007 and no changes required. Patient states former smoker. Quit approx 62yrs ago. Smoked x 30 yrs upto 3ppd. Pt is married. Pt is retired Medical illustrator. Alcohol Use - yes Daily Caffeine Use Patient is a former smoker.   Review of Systems       The patient complains of shortness of breath with activity and non-productive cough.  The patient denies shortness of breath at rest, productive cough, coughing up blood, chest pain, irregular heartbeats, acid heartburn, indigestion, loss of appetite, weight change, abdominal pain, difficulty swallowing, sore throat, tooth/dental problems, headaches, nasal congestion/difficulty breathing through nose, sneezing, itching, ear ache, anxiety, depression, hand/feet swelling, joint stiffness or pain, rash, change in color of mucus, and fever.    Vital Signs:  Patient profile:   77 year old male Height:      73 inches Weight:      181.50 pounds BMI:     24.03 O2 Sat:      90 % on 4 L/minpulsed Temp:     97.7 degrees F oral Pulse rate:   77 / minute BP sitting:   112 / 70  (left arm) Cuff size:   regular  Vitals Entered By: Gweneth Dimitri RN  (November 15, 2009 10:33 AM)  O2 Flow:  4 L/minpulsed CC: Follow up.  Breathing "about the same."  Cough - rarely prod with yellow mucus.  Does patient need assistance? Functional Status Shopping Comments Medications reviewed with patient Daytime contact number verified with patient. Gweneth Dimitri RN  November 15, 2009 10:33 AM    Physical Exam  Additional Exam:  Gen: Pleasant, in no distress , normal affect ENT: no lesions, no post nasal drip Neck: No JVD, no TMG, no carotid bruits Lungs: No use of accessory muscles, no dullness to percussion, dry bibasilar rales no rhonchi now ausculted Cardiovascular: RRR, heart sounds normal, no murmurs or gallops, tr to none  edema on left, none on right, scattered varicosities esp along left lower leg/ankle. venous insufficiency changes. neg homans sign, neg calf pain.  Abdomen: soft and non-tender, no HSM, BS normal, no guarding or  rebound.  Musculoskeletal: No deformities, no cyanosis or clubbing Neuro: alert, non-focal     Impression & Recommendations:  Problem # 1:  PULMONARY FIBROSIS (ICD-515) Assessment Unchanged stable pulmonary fibrosis autoimmune plan No change in inhaled medications.   Maintain treatment program as currently prescribed. ok to resume actonel  Problem # 2:  COPD (ICD-496) Assessment: Unchanged  Stable COPD plan No change in inhaled medications.   Maintain treatment program as currently prescribed.  Medications Added to Medication List This Visit: 1)  Mirtazapine 15 Mg Tabs (Mirtazapine) .... Take 1 tab by mouth at bedtime 2)  Pepcid Ac Maximum Strength 20 Mg Chew (Famotidine) .... One by mouth daily 3)  Actonel 35 Mg Tabs (Risedronate sodium) .... One tablet by mouth weekly  Complete Medication List: 1)  Toprol Xl 50 Mg Tb24 (Metoprolol succinate) .... Take 1 tablet by mouth once a day 2)  Adult Aspirin Low Strength 81 Mg Tbdp (Aspirin) .... Take 1 tablet by mouth once a day 3)  Avapro 150 Mg Tabs  (Irbesartan) .[name] Kitchen.. 1 by mouth daily 4)  Lunesta 3 Mg Tabs (Eszopiclone) .... Take 1 tab by mouth at bedtime 5)  Zocor 40 Mg Tabs (Simvastatin) .... Once daily 6)  Anti-oxidant Tabs (Multiple vitamin) .... Once daily 7)  Calcium 600 1500 Mg Tabs (Calcium carbonate) .... Take 1 am and at bedtime 8)  Zetia 10 Mg Tabs (Ezetimibe) .[name] Kitchen.. 1 by mouth daily 9)  Spiriva Handihaler 18 Mcg Caps (Tiotropium bromide monohydrate) .[name] Kitchen.. 1 capsule once daily 10)  Qvar 80 Mcg/act Aers (Beclomethasone dipropionate) .... 2  puffs two times a day 11)  Oxygen  .[name] Kitchen.. 4l rest/exertion 12)  Azathioprine 50 Mg Tabs (Azathioprine) .... Take 4 by mouth daily 13)  Nexium 40 Mg Cpdr (Esomeprazole magnesium) .[name] Kitchen.. 1 by mouth once daily 14)  Mirtazapine 15 Mg Tabs (Mirtazapine) .... Take 1 tab by mouth at bedtime 15)  Pepcid Ac Maximum Strength 20 Mg Chew (Famotidine) .... One by mouth daily 16)  Actonel 35 Mg Tabs (Risedronate sodium) .... One tablet by mouth weekly  Other Orders: Est. Patient Level III ([address]) TLB-Hepatic/Liver Function Pnl (80076-HEPATIC) TLB-CBC Platelet - w/Differential (85025-CBCD)  Patient Instructions: 1)  You may resume actonel 2)  No other medication changes 3)  Labs today 4)  Return 3 months Prescriptions: ACTONEL 35 MG  TABS (RISEDRONATE SODIUM) One tablet by mouth weekly  #1 month x 6   Entered and Authorized by:   Storm Frisk MD   Signed by:   Storm Frisk MD on 11/15/2009   Method used:   Print then Give to Patient   RxID:   [id number]     Prevention & Chronic Care Immunizations   Influenza vaccine: Fluvax 3+  (10/24/2009)    Tetanus booster: Not documented    Pneumococcal vaccine: Historical  (01/22/2006)    H. zoster vaccine: Not documented  Colorectal Screening   Hemoccult: Not documented    Colonoscopy: Location:  [facility].    (04/02/2005)  Other Screening   PSA: Not documented   Smoking status: quit > 6 months   (11/15/2009)  Lipids   Total Cholesterol: Not documented   LDL: Not documented   LDL Direct: Not documented   HDL: Not documented   Triglycerides: Not documented    SGOT (AST): 20  (06/21/2009)   SGPT (ALT): 13  (06/21/2009)   Alkaline phosphatase: 77  (06/21/2009)   Total bilirubin: 0.4  (06/21/2009)  Hypertension   Last Blood Pressure: 112 /  70  (11/15/2009)   Serum creatinine: 1.3  (04/13/2009)   Serum potassium 4.4  (04/13/2009)  Self-Management Support :    Hypertension self-management support: Not documented    Lipid self-management support: Not documented     Appended Document: Pulmonary OV fax ron polite

## 2010-02-21 NOTE — Progress Notes (Signed)
  Phone Note Outgoing Call   Request: Send information Summary of Call: Request for records received from [facility]. Request forwarded to [facility].

## 2010-02-21 NOTE — Letter (Signed)
Summary: [facility] [place]   Imported By: Lester Riverside 12/14/2009 10:07:26  _____________________________________________________________________  External Attachment:    Type:   Image     Comment:   External Document

## 2010-02-21 NOTE — Miscellaneous (Signed)
Summary: prior auth for Lunesta - form in PW's lookat  received prior auth request from [employer] in [place], [place] for Lunesta 2mg  tabs.  Pt takes 1 tab by mouth at bedtime as needed.  Called and spoke with [facility] Rep and was informed this is requiring prior auth because they have documentation that pt is taking a similar med for same problems (zolpidem)  I reviewed pt's chart.  Looks like PW prescribed Lunesta to pt on 04-27-2009 and pt's current med list only indicates Lunesta, not Zolpidem.  However, I also reviewed a recent d/c summary on pt for 04-29-2009 when pt was in hosp and it looks like d/c meds indicate Zolpidem.  Therefore pt has refilled Zolpidem and Lunesta.  Will forward message to PW to get his opinion on this.  Arman Filter LPN  Jun 14, 2009 5:05 PM   This pt is ok to refill lunesta.  He tried zolpidem and this did not work.    called spoke with pharmacist to find out when pt filled the ambien and the quantity: filled it on 06-02-09 #30.  i then called [facility] to find out if the PA still needs to be done since PEW wants pt to continue the lunesta and discontinue the [place].  per Francine Graven, a PA can be done now so that pt may have the medication before 06-22-09 (22 days after he filled the [place] - the pharmacy allows refills 12 days early) or patient may continue with the ambien or no sleep aid for the next week and send a new refill request for the lunesta on june 1.  per [facility], if pt prefers to use the [place] "urgent" may be written at the top of the form to expedite the request.  called spoke with patient, he states he prefers for [place] to go ahead and do the PA for lunesta so that he may begin using it before june 1.  form placed in PEW's lookat. Boone Master CNA/MA  Jun 15, 2009 3:33 PM   PA filled out and signed by PEW.  faxed to [facility] to await approval/denial. Boone Master CNA/MA  Jun 16, 2009 11:56 AM   PA approved pharmacy and pt aware. Carron Curie CMA   Jun 17, 2009 11:05 AM  Clinical Lists Changes

## 2010-02-21 NOTE — Medication Information (Signed)
Summary: Lunesta/[employer]  Lunesta/[employer]   Imported By: Sherian Rein 10/13/2009 11:17:49  _____________________________________________________________________  External Attachment:    Type:   Image     Comment:   External Document

## 2010-02-21 NOTE — Progress Notes (Signed)
Summary: Spiriva refill  Phone Note Call from Patient Call back at Home Phone [phone]   Caller: Patient Call For: wright Reason for Call: Talk to Nurse Summary of Call: his refills are unable to be filled, beause of something going on here.  Please call pt or pharmacy to try to get this worked out.  Spiriva rx [facility] [address] [place]  Pt requested that [place] call him back. Initial call taken by: Eugene Gavia,  May 02, 2009 9:42 AM  Follow-up for Phone Call        Pt aware we will call his pharmacy and straighten out the RX for Spiriva. Spiriva RX was sent in March 2011 but pharmacy states they did not receive it. RX called in verbally to [facility] on [address]  Spoke with Aundra Millet at the pharmacy. Follow-up by: Michel Bickers CMA,  May 02, 2009 10:12 AM    Prescriptions: SPIRIVA HANDIHALER 18 MCG CAPS (TIOTROPIUM BROMIDE MONOHYDRATE) 1 capsule once daily Brand medically necessary #30 Capsule x 6   Entered by:   Michel Bickers CMA   Authorized by:   Storm Frisk MD   Signed by:   Michel Bickers CMA on 05/02/2009   Method used:   Telephoned to ...       [facility] [address] (retail)       [address]       [place], [place]  [address]       Ph: [phone] or [phone]       Fax: [phone]   RxID:   [id number]

## 2010-02-21 NOTE — Letter (Signed)
Summary: [facility]   Imported By: Sherian Rein 02/18/2009 15:17:39  _____________________________________________________________________  External Attachment:    Type:   Image     Comment:   External Document

## 2010-02-21 NOTE — Letter (Signed)
Summary: [facility]   Imported By: Sherian Rein 06/16/2009 14:07:09  _____________________________________________________________________  External Attachment:    Type:   Image     Comment:   External Document

## 2010-02-21 NOTE — Medication Information (Signed)
Summary: [facility]   Imported By: Valinda Hoar 07/05/2009 14:19:09  _____________________________________________________________________  External Attachment:    Type:   Image     Comment:   External Document

## 2010-02-21 NOTE — Assessment & Plan Note (Signed)
Summary: Pulmonary/ ext f/u - esr now 85,  cxr worse, d/c biphosphonates   Copy to:  Evan [name] - Evan [name] Primary Provider/Referring Provider:  Dr. Hyacinth Meeker @ [phone]  CC:  Acute visit.  Pt c/o extreme fatigue x several days and also feels that "blood is rushing to ears".  He also c/o heaviness in chest but denies any pain in chest.  He states that because he is so tired he is having trouble catching his breathing.Evan [name]  History of Present Illness: 77 yowm quit smoker around 1980  history of chronic obstructive airways disease exacerbated by chronic rheumatoid arthritis, associated pulmonary fibrosis,  on 02 since 2008  November 10, 2008 1 Pt had acute onset bronchitis seen one weekago Pt also had heartburn Pt seen by NP and rx was : rx: Hold Actonel for 2 weeks GERD diet  Prilosec 20mg  once daily  Mucinex DM two times a day as needed cough/congestion Augmentin 875mg  two times a day for 7 days  Now heartburn is better ,  cough is still productive of mucous and yellow There is no wheeze. The dyspnea is the same   January 19, 2009  at last ov we rec: Hold Actonel for one more  week then resume Stay on prilosec for two more weeks Follow Reflux Diet see sheet Reduce prednisone to 1/2 three times weekly for two weeks then stop Take augmentin for three more days beyond current prescription, I  There now is no change in cough The dyspnea about the same  There is no chest pain, still with indigestion.  April 13, 2009 Acute visit.  Pt c/o extreme fatigue x several days and also feels that "blood is rushing to ears".  He also c/o heaviness in chest but denies any pain in chest.  He states that because he is so tired he is having trouble catching his breathing. more tired than sob. no cough.  does have hearburn alot and uses tums maybe 4 x weekly.  arthritis ok. Pt denies any significant sore throat, dysphagia, itching, sneezing,  nasal congestion or excess secretions,  fever, chills,  sweats, unintended wt loss, pleuritic or exertional cp, hempoptysis, change in activity tolerance  orthopnea pnd or leg swelling Pt denies any significant sore throat, dysphagia, itching, sneezing,  nasal congestion or excess secretions,  fever, chills, sweats, unintended wt loss, pleuritic or exertional cp, hempoptysis, change in activity tolerance  orthopnea pnd or leg swelling   Current Medications (verified): 1)  Toprol Xl 50 Mg  Tb24 (Metoprolol Succinate) .... Take 1 Tablet By Mouth Once A Day 2)  Adult Aspirin Low Strength 81 Mg  Tbdp (Aspirin) .... Take 1 Tablet By Mouth Once A Day 3)  Amitriptyline Hcl 50 Mg Tabs (Amitriptyline Hcl) .... Take 1 Tablet By Mouth Once A Day 4)  Avapro 150 Mg Tabs (Irbesartan) .Evan [name].. 1 By Mouth Daily 5)  Actonel 35 Mg  Tabs (Risedronate Sodium) .Evan [name].. 1 Q Week 6)  Zolpidem Tartrate 10 Mg Tabs (Zolpidem Tartrate) .... Take 1 Tab By Mouth At Bedtime As Needed 7)  Zocor 40 Mg Tabs (Simvastatin) .... Once Daily 8)  Anti-Oxidant   Tabs (Multiple Vitamin) .... Once Daily 9)  Calcium 600 1500 Mg  Tabs (Calcium Carbonate) .... Take 1 Am and At Bedtime 10)  Zetia 10 Mg Tabs (Ezetimibe) .Evan [name].. 1 By Mouth Daily 11)  Spiriva Handihaler 18 Mcg Caps (Tiotropium Bromide Monohydrate) .Evan [name].. 1 Capsule Once Daily 12)  Qvar 80 Mcg/act Aers (Beclomethasone Dipropionate) .... 3 Puffs  Two Times A Day 13)  Oxygen .Evan [name].. 4l Rest/exertion 14)  Imuran 150 Mg .Evan [name].. 1 Once Daily 15)  Melatonin 3 Mg Tabs (Melatonin) .... 3 At Bedtime As Needed  Allergies (verified): No Known Drug Allergies  Past History:  Past Medical History: CORONARY HEART DISEASE (ICD-414.00) AMI 2004 with stents ARTHRITIS, RHEUMATOID (ICD-714.0) EMPHYSEMA (ICD-492.8) PULMONARY FIBROSIS (ICD-515)    -TLC 69%  DLCO 68% FeV1 97%  2009    -TLC 74%  DLCO 66% FeV1 83% 2010    -CT Chest :UIP and paraseptal centrilobular emphysema 2009    -CT Chest :  03/2008 UIP and paraseptal centrilobular emphysema sl worse compared to  2009    - walk: 1700 ft,  no desat HX OFASTHMATIC BRONCHITIS, ACUTE (ICD-466.0) COPD (ICD-496)  Past Pulmonary History:  Pulmonary History: EMPHYSEMA (ICD-492.8) PULMONARY FIBROSIS (ICD-515)    -TLC 69%  DLCO 68% FeV1 97%  2009    -TLC 74%  DLCO 66% FeV1 83% 2010    -CT Chest :UIP and paraseptal centrilobular emphysema 2009    -CT Chest :  03/2008 UIP and paraseptal centrilobular emphysema sl worse compared to 2009    - walk: 1700 ft,  no desat HX OFASTHMATIC BRONCHITIS, ACUTE (ICD-466.0) COPD (ICD-496)  Vital Signs:  Patient profile:   77 year old male Weight:      183 pounds O2 Sat:      93 % on 4 L/min Temp:     98.1 degrees F oral Pulse rate:   80 / minute BP sitting:   130 / 80  (left arm)  Vitals Entered By: Vernie Murders (April 13, 2009 1:35 PM)  O2 Flow:  4 L/min  Physical Exam  Additional Exam:  Gen: Pleasant, well-nourished, in no distress , normal affect wt 190> 183 April 13, 2009  ENT: no lesions, no post nasal drip Neck: No JVD, no TMG, no carotid bruits Lungs: No use of accessory muscles, no dullness to percussion, dry bibasilar rales R >> L Cardiovascular: RRR, heart sounds normal, no murmurs or gallops, no peripheral edema Abdomen: soft and non-tender, no HSM, BS normal Musculoskeletal: No deformities, no cyanosis or clubbing Neuro: alert, non-focal   Sodium                    138 mEq/L                   135-145   Potassium                 4.4 mEq/L                   3.5-5.1   Chloride                  101 mEq/L                   96-112   Carbon Dioxide            29 mEq/L                    19-32   Glucose              [H]  119 mg/dL                   45-40   BUN                       16 mg/dL  6-23   Creatinine                1.3 mg/dL                   0.9-8.1   Calcium                   9.2 mg/dL                   1.9-14.7   GFR                       57.12 mL/min                >60  Tests: (2) CBC Platelet w/Diff  (CBCD)   White Cell Count          4.7 K/uL                    4.5-10.5   Red Cell Count       [L]  3.23 Mil/uL                 4.22-5.81   Hemoglobin           [L]  11.8 g/dL                   82.9-56.2   Hematocrit           [L]  35.0 %                      39.0-52.0   MCV                  [H]  108.2 fl                    78.0-100.0   MCHC                      33.7 g/dL                   13.0-86.5   RDW                       13.9 %                      11.5-14.6   Platelet Count            199.0 K/uL                  150.0-400.0   Neutrophil %              76.1 %                      43.0-77.0   Lymphocyte %         [L]  8.5 %                       12.0-46.0   Monocyte %                11.6 %                      3.0-12.0   Eosinophils%              3.4 %  0.0-5.0   Basophils %               0.4 %                       0.0-3.0   Neutrophill Absolute      3.6 K/uL                    1.4-7.7   Lymphocyte Absolute  [L]  0.4 K/uL                    0.7-4.0   Monocyte Absolute         0.5 K/uL                    0.1-1.0  Eosinophils, Absolute                             0.2 K/uL                    0.0-0.7   Basophils Absolute        0.0 K/uL                    0.0-0.1  Tests: (3) Hepatic/Liver Function Panel (HEPATIC)   Total Bilirubin           0.4 mg/dL                   9.8-1.1   Direct Bilirubin          0.1 mg/dL                   9.1-4.7   Alkaline Phosphatase      95 U/L                      39-117   AST                       22 U/L                      0-37   ALT                       13 U/L                      0-53   Total Protein             7.3 g/dL                    8.2-9.5   Albumin                   3.7 g/dL                    6.2-1.3  Tests: (4) TSH (TSH)   FastTSH                   3.04 uIU/mL                 0.35-5.50  Tests: (5) Sed Rate (ESR)   Sed Rate             [H]  85 mm/hr  0-22  CXR  Procedure date:   04/13/2009  Findings:      Findings: Pulmonary interstitial fibrosis with progression when compared to 05/07/2007.  Accentuated coarsened interstitial markings primarily in the lower lung zones.  Probable emphysematous changes in the upper lung zones particularly on the left.  Normal cardiac silhouette.  Bony thorax intact.   IMPRESSION: Pulmonary interstitial fibrosis with progression since 05/07/2007 CXR.  Impression & Recommendations:  Problem # 1:  PULMONARY FIBROSIS (ICD-515) DDx for pulmonary fibrosis with honeycombing includes idiopathic pulmonary fibrosis, pulmonary fibrosis associated with rheumatologic disease, adverse effect from  drugs such as chemotherapy or amiodarone exposure, nonspecific interstitial pneumonia which is typically steroid responsive, and chronic hypersensitivity pneumonitis.   This seems most likely rheumatoid and note marked elevation of esr with ? progression of dz on immuran with ddx ? reaction to immuran (doubt)  or RA activity (doubt since no articular c/o's) or related to occult gerd worse biphosphonates, so try off for now (helped in the past to leave these off temporarily).   Each maintenance medication was reviewed in detail including most importantly the difference between maintenance and as needed and under what circumstances the prns are to be used. See instructions for specific recommendations  - try off the biphophonates again until returns to regroup with Dr Delford Field.  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  Orders: Est. Patient Level IV (29562)  Medications Added to Medication List This Visit: 1)  Imuran 150 Mg  .Evan [name].. 1 once daily 2)  Melatonin 3 Mg Tabs (Melatonin) .... 3 at bedtime as needed  Other Orders: TLB-BMP (Basic Metabolic Panel-BMET) (80048-METABOL) TLB-CBC Platelet - w/Differential (85025-CBCD) TLB-Hepatic/Liver Function Pnl (80076-HEPATIC) TLB-TSH (Thyroid Stimulating Hormone)  (84443-TSH) TLB-Sedimentation Rate (ESR) (85652-ESR) T-2 View CXR (71020TC)  Patient Instructions: 1)  We will call you with results 2)  Stop actonel for now 3)  Please schedule a follow-up appointment in 2 weeks, sooner if needed with Dr Delford Field 4)  GERD (REFLUX)  is a common cause of respiratory symptoms. It commonly presents without heartburn and can be treated with medication, but also with lifestyle changes including avoidance of late meals, excessive alcohol, smoking cessation, and avoid fatty foods, chocolate, peppermint, colas, red wine, and acidic juices such as orange juice. NO MINT OR MENTHOL PRODUCTS SO NO COUGH DROPS  5)  USE SUGARLESS CANDY INSTEAD (jolley ranchers)  6)  NO OIL BASED VITAMINS

## 2010-02-21 NOTE — Progress Notes (Signed)
Summary: Lab results  Phone Note Outgoing Call   Reason for Call: Discuss lab or test results Summary of Call: Call the patient and tell him the labs were all ok   no change in medications Initial call taken by: Storm Frisk MD,  Jun 21, 2009 5:03 PM  Follow-up for Phone Call        Called, spoke with pt.  Pt informed all labs ok and no change in medications per PW.  He verbalized understanding.  Gweneth Dimitri RN  Jun 21, 2009 5:19 PM

## 2010-02-21 NOTE — Assessment & Plan Note (Signed)
Summary: Pulmonary OV   Copy to:  Rheum Zenovia Jordan Primary Provider/Referring Provider:  Dr. Renford Dills  CC:  1 month follow up.  Pt states breathing is worse-having increased SOB with less activity.  States he does have a prod cough with yellow mucus.  Denies wheezing and chest tightness.  [name] Kitchen  History of Present Illness: 77 yowm quit smoker around 1980  history of chronic obstructive airways disease exacerbated by chronic rheumatoid arthritis, associated pulmonary fibrosis,  on 02 since 2008  April 13, 2009 Acute visit.  Pt c/o extreme fatigue x several days and also feels that "blood is rushing to ears".  He also c/o heaviness in chest but denies any pain in chest.  He states that because he is so tired he is having trouble catching his breathing. more tired than sob. no cough.  does have hearburn alot and uses tums maybe 4 x weekly.  arthritis ok. Pt denies any significant sore throat, dysphagia, itching, sneezing,  nasal congestion or excess secretions,  fever, chills, sweats, unintended wt loss, pleuritic or exertional cp, hempoptysis, change in activity tolerance  orthopnea pnd or leg swelling Pt denies any significant sore throat, dysphagia, itching, sneezing,  nasal congestion or excess secretions,  fever, chills, sweats, unintended wt loss, pleuritic or exertional cp, hempoptysis, change in activity tolerance  orthopnea pnd or leg swelling   April 27, 2009 3:18 PM Pt noting c/o electric shock in shoulder.  The pt  is not sleeping  only 4hrs/night,   The R chest wall feels like sand paper.  Actonel was stopped due to concern of GERD at 3/23 OV.  ESR last OV was elevated  There is no change in cough,  not as active, more fatigued.  The  heartburn is worse and is using prevacid.  Pt still with heaviness in chest.   Fatigue is the largest complaint.   Pt denies any significant sore throat, nasal congestion or excess secretions, fever, chills, sweats, unintended weight loss, pleurtic or  exertional chest pain, orthopnea PND, or leg swelling Pt denies any increase in rescue therapy over baseline, denies waking up needing it or having any early am or nocturnal exacerbations of coughing/wheezing/or dyspnea.  Note that later in the evening after this OV the pt went to [facility] ED and admitted 4/6 -4/8 for r/o MI.  All enzymes were negative.  Cxr stable KUB showed severe obstipation and ileus.  Pt d/c on miralax for bowels.  Pt did not relate to me the no bms for 7-10days with the earlier OV   Jun 21, 2009 9:48 AM Pt now noting wheezing and dyspnea worsening.   Notes more dyspnea with any activity.  More mucus and is yellow and pasty.  Mucus is thick and hard to raise.  No real chest pain.  Sl edema in L foot.  Bowels ok now.  Pt was in hosp 4/6 -4/8 for obstipation and AMI was ruled out.   During 4/11 hosp stay : WBC 5.0 and LFTs normal 4/11.  CXR was stable with pulm fibrosis 4/11 Pt denies any significant sore throat, nasal congestion , fever, chills, sweats, unintended weight loss, pleurtic or exertional chest pain, orthopnea PND, or leg swelling. Pt remains on Spiriva and Qvar.  Pt is not on any prednisone.  Pt is on 200mg  /d azathioprine.    Preventive Screening-Counseling & Management  Alcohol-Tobacco     Smoking Status: quit > 6 months  Current Medications (verified): 1)  Toprol Xl 50 Mg  Tb24 (Metoprolol Succinate) .... Take 1 Tablet By Mouth Once A Day 2)  Adult Aspirin Low Strength 81 Mg  Tbdp (Aspirin) .... Take 1 Tablet By Mouth Once A Day 3)  Amitriptyline Hcl 50 Mg Tabs (Amitriptyline Hcl) .... Take 1 Tablet By Mouth Once A Day 4)  Avapro 150 Mg Tabs (Irbesartan) .[name] Kitchen.. 1 By Mouth Daily 5)  Lunesta 2 Mg Tabs (Eszopiclone) .... Take One By Mouth At Bedtime As Needed Sleep 6)  Zocor 40 Mg Tabs (Simvastatin) .... Once Daily 7)  Anti-Oxidant   Tabs (Multiple Vitamin) .... Once Daily 8)  Calcium 600 1500 Mg  Tabs (Calcium Carbonate) .... Take 1 Am and At Bedtime 9)  Zetia  10 Mg Tabs (Ezetimibe) .[name] Kitchen.. 1 By Mouth Daily 10)  Spiriva Handihaler 18 Mcg Caps (Tiotropium Bromide Monohydrate) .[name] Kitchen.. 1 Capsule Once Daily 11)  Qvar 80 Mcg/act Aers (Beclomethasone Dipropionate) .... 2  Puffs Two Times A Day 12)  Oxygen .[name] Kitchen.. 4l Rest/exertion 13)  Azathioprine 50 Mg Tabs (Azathioprine) .... Take 4 By Mouth Daily 14)  Melatonin 3 Mg Tabs (Melatonin) .... 3 At Bedtime As Needed 15)  Actonel 35 Mg Tabs (Risedronate Sodium) .... Once Weekly  Allergies (verified): No Known Drug Allergies  Past History:  Past medical, surgical, family and social histories (including risk factors) reviewed, and no changes noted (except as noted below).  Past Medical History: Reviewed history from 04/13/2009 and no changes required. CORONARY HEART DISEASE (ICD-414.00) AMI 2004 with stents ARTHRITIS, RHEUMATOID (ICD-714.0) EMPHYSEMA (ICD-492.8) PULMONARY FIBROSIS (ICD-515)    -TLC 69%  DLCO 68% FeV1 97%  2009    -TLC 74%  DLCO 66% FeV1 83% 2010    -CT Chest :UIP and paraseptal centrilobular emphysema 2009    -CT Chest :  03/2008 UIP and paraseptal centrilobular emphysema sl worse compared to 2009    - walk: 1700 ft,  no desat HX OFASTHMATIC BRONCHITIS, ACUTE (ICD-466.0) COPD (ICD-496)  Past Surgical History: Reviewed history from 09/10/2007 and no changes required. stents with AMI 2004 Left foot surgery  Vericose vein surgery  Past Pulmonary History:  Pulmonary History: EMPHYSEMA (ICD-492.8) PULMONARY FIBROSIS (ICD-515)    -TLC 69%  DLCO 68% FeV1 97%  2009    -TLC 74%  DLCO 66% FeV1 83% 2010    -CT Chest :UIP and paraseptal centrilobular emphysema 2009    -CT Chest :  03/2008 UIP and paraseptal centrilobular emphysema sl worse compared to 2009    - walk: 1700 ft,  no desat HX OFASTHMATIC BRONCHITIS, ACUTE (ICD-466.0) COPD (ICD-496)  Family History: Reviewed history from 11/18/2007 and no changes required. sister breast ca colon ca father in his 101's AMI  mother Pulmonary Fibrosis-brother passed away 10/15/22  Social History: Reviewed history from 11/18/2007 and no changes required. Patient states former smoker. Quit approx 76yrs ago. Smoked x 30 yrs upto 3ppd. Pt is married. Pt is retired Medical illustrator. Alcohol Use - yes Daily Caffeine Use Patient is a former smoker.   Review of Systems       The patient complains of shortness of breath with activity, productive cough, and non-productive cough.  The patient denies shortness of breath at rest, coughing up blood, chest pain, irregular heartbeats, acid heartburn, indigestion, loss of appetite, weight change, abdominal pain, difficulty swallowing, sore throat, tooth/dental problems, headaches, nasal congestion/difficulty breathing through nose, sneezing, itching, ear ache, anxiety, depression, hand/feet swelling, joint stiffness or pain, rash, change in color of mucus, and fever.    Vital Signs:  Patient profile:  77 year old male Height:      73 inches Weight:      174 pounds BMI:     23.04 O2 Sat:      98 % on 4 L/minpulsed Temp:     97.8 degrees F oral Pulse rate:   75 / minute BP sitting:   116 / 78  (right arm) Cuff size:   regular  Vitals Entered By: Gweneth Dimitri RN (Jun 21, 2009 9:39 AM)  O2 Flow:  4 L/minpulsed CC: 1 month follow up.  Pt states breathing is worse-having increased SOB with less activity.  States he does have a prod cough with yellow mucus.  Denies wheezing and chest tightness.   Comments Medications reviewed with patient Daytime contact number verified with patient. Gweneth Dimitri RN  Jun 21, 2009 9:40 AM    Physical Exam  Additional Exam:  Gen: Pleasant, well-nourished, in no distress , normal affect ENT: no lesions, no post nasal drip Neck: No JVD, no TMG, no carotid bruits Lungs: No use of accessory muscles, no dullness to percussion, dry bibasilar rales R >> L seems worse vs 4/11 OV.  Scattered rhonchi noted. Cardiovascular: RRR, heart sounds normal, no  murmurs or gallops, no peripheral edema Abdomen: soft and non-tender, no HSM, BS normal Musculoskeletal: No deformities, no cyanosis or clubbing Neuro: alert, non-focal     Impression & Recommendations:  Problem # 1:  ACUTE BRONCHITIS (ICD-466.0) Assessment Deteriorated Acute tracheobronchitis with flare assoc pulm fibrosis and COPD primary emphysematous component plan Avelox 400mg /d x 5 days Pulse prednisone 10mg  Take 4 daily for two days, then 3 daily for two days, then two daily for two days then one daily for two days then stop No change in inhaled medications  His updated medication list for this problem includes:    Spiriva Handihaler 18 Mcg Caps (Tiotropium bromide monohydrate) .[name] Kitchen... 1 capsule once daily    Qvar 80 Mcg/act Aers (Beclomethasone dipropionate) .[name] Kitchen... 2  puffs two times a day    Avelox 400 Mg Tabs (Moxifloxacin hcl) ..... By mouth daily  Orders: Est. Patient Level IV (18841)  Problem # 2:  PULMONARY FIBROSIS (ICD-515) Assessment: Unchanged  Severe autoimmune assoicated pulmonary fibrosis,  with progression of disease plan Cont  imuran to 200mg /d cont oxygen  f/u labs LFTs/CBC  Medications Added to Medication List This Visit: 1)  Actonel 35 Mg Tabs (Risedronate sodium) .... Once weekly 2)  Avelox 400 Mg Tabs (Moxifloxacin hcl) .... By mouth daily 3)  Prednisone 10 Mg Tabs (Prednisone) .... Take as directed take 4 daily for two days, then 3 daily for two days, then two daily for two days then one daily for two days then stop  Complete Medication List: 1)  Toprol Xl 50 Mg Tb24 (Metoprolol succinate) .... Take 1 tablet by mouth once a day 2)  Adult Aspirin Low Strength 81 Mg Tbdp (Aspirin) .... Take 1 tablet by mouth once a day 3)  Amitriptyline Hcl 50 Mg Tabs (Amitriptyline hcl) .... Take 1 tablet by mouth once a day 4)  Avapro 150 Mg Tabs (Irbesartan) .[name] Kitchen.. 1 by mouth daily 5)  Lunesta 2 Mg Tabs (Eszopiclone) .... Take one by mouth at bedtime as needed  sleep 6)  Zocor 40 Mg Tabs (Simvastatin) .... Once daily 7)  Anti-oxidant Tabs (Multiple vitamin) .... Once daily 8)  Calcium 600 1500 Mg Tabs (Calcium carbonate) .... Take 1 am and at bedtime 9)  Zetia 10 Mg Tabs (Ezetimibe) .[name] Kitchen.. 1 by mouth daily 10)  Spiriva Handihaler 18 Mcg Caps (Tiotropium bromide monohydrate) .[name] Kitchen.. 1 capsule once daily 11)  Qvar 80 Mcg/act Aers (Beclomethasone dipropionate) .... 2  puffs two times a day 12)  Oxygen  .[name] Kitchen.. 4l rest/exertion 13)  Azathioprine 50 Mg Tabs (Azathioprine) .... Take 4 by mouth daily 14)  Melatonin 3 Mg Tabs (Melatonin) .... 3 at bedtime as needed 15)  Actonel 35 Mg Tabs (Risedronate sodium) .... Once weekly 16)  Avelox 400 Mg Tabs (Moxifloxacin hcl) .... By mouth daily 17)  Prednisone 10 Mg Tabs (Prednisone) .... Take as directed take 4 daily for two days, then 3 daily for two days, then two daily for two days then one daily for two days then stop  Other Orders: TLB-CBC Platelet - w/Differential (85025-[id number]) TLB-Hepatic/Liver Function Pnl ([id number]-[id number])  Patient Instructions: 1)  Avelox one daily for 5 days 2)  Prednisone pulse for 8days 3)  No change in azathioprine dose 4)  Labs today 5)  Return two months  Prescriptions: PREDNISONE 10 MG  TABS (PREDNISONE) Take as directed Take 4 daily for two days, then 3 daily for two days, then two daily for two days then one daily for two days then stop  #20 x 0   Entered and Authorized by:   Storm Frisk MD   Signed by:   Storm Frisk MD on 06/21/2009   Method used:   Electronically to        [facility] [address] (retail)       [address]       [place], [place]  [address]       Ph: [phone] or [phone]       Fax: [phone]   RxID:   [id number] AVELOX 400 MG  TABS (MOXIFLOXACIN HCL) By mouth daily  #4 x 0   Entered and Authorized by:   Storm Frisk MD   Signed by:   Storm Frisk MD on 06/21/2009   Method used:    Electronically to        [facility] [address] (retail)       [address]       [place], [place]  [address]       Ph: [phone] or [phone]       Fax: [phone]   RxID:   [id number]   Appended Document: Pulmonary OV fax Zenovia Jordan, Ron Polite

## 2010-02-21 NOTE — Progress Notes (Signed)
  Phone Note Other Incoming   Request: Send information Action Taken: Software engineer of Call: Forwarded to [employer] for processing.

## 2010-02-21 NOTE — Assessment & Plan Note (Signed)
Summary: Pulmonary OV   Copy to:  Rheum Zenovia Jordan Primary Provider/Referring Provider:  Dr. Renford Dills  CC:  2 month follow up.  States breathing is the same since last OV.  States he does have a prod cough with yellow mucus.  Denies wheezing and chest tightness.  [name] Kitchen  History of Present Illness: 77 yowm quit smoker around 1980  history of chronic obstructive airways disease exacerbated by chronic rheumatoid arthritis, associated pulmonary fibrosis,  on 02 since 2008   April 27, 2009 3:18 PM Pt noting c/o electric shock in shoulder.  The pt  is not sleeping  only 4hrs/night,   The R chest wall feels like sand paper.  Actonel was stopped due to concern of GERD at 3/23 OV.  ESR last OV was elevated  There is no change in cough,  not as active, more fatigued.  The  heartburn is worse and is using prevacid.  Pt still with heaviness in chest.   Fatigue is the largest complaint.   Pt denies any significant sore throat, nasal congestion or excess secretions, fever, chills, sweats, unintended weight loss, pleurtic or exertional chest pain, orthopnea PND, or leg swelling Pt denies any increase in rescue therapy over baseline, denies waking up needing it or having any early am or nocturnal exacerbations of coughing/wheezing/or dyspnea.  Note that later in the evening after this OV the pt went to [facility] ED and admitted 4/6 -4/8 for r/o MI.  All enzymes were negative.  Cxr stable KUB showed severe obstipation and ileus.  Pt d/c on miralax for bowels.  Pt did not relate to me the no bms for 7-10days with the earlier OV   Jun 21, 2009 9:48 AM Pt now noting wheezing and dyspnea worsening.   Notes more dyspnea with any activity.  More mucus and is yellow and pasty.  Mucus is thick and hard to raise.  No real chest pain.  Sl edema in L foot.  Bowels ok now.  Pt was in hosp 4/6 -4/8 for obstipation and AMI was ruled out.   During 4/11 hosp stay : WBC 5.0 and LFTs normal 4/11.  CXR was stable with pulm  fibrosis 4/11 Pt denies any significant sore throat, nasal congestion , fever, chills, sweats, unintended weight loss, pleurtic or exertional chest pain, orthopnea PND, or leg swelling. Pt remains on Spiriva and Qvar.  Pt is not on any prednisone.  Pt is on 200mg  /d azathioprine.  07/07/09--Presents for an acute office visit. Complains of swelling in left foot/ankle and calf, occ shooting pain in arch of foot x2weeks. Pt complains that his left foot gets puffy in afternoons , bottom of foot hurts from time to time. Gets mild swelling in right but minimall, not as bad on left. No new meds, injury , recent travel of change in breathing. Denies chest pain,  orthopnea, hemoptysis, fever, n/v/d, headache.  August 17, 2009 9:39 AM The pt now notes some yellow mucus  sl more than before.  No chest pain.  Dyspnea is the same, not able to exert self.   No new issues. Edema is better with support hose.  No chest pain.   Pt denies any significant sore throat, nasal congestion or excess secretions, fever, chills, sweats, unintended weight loss, pleurtic or exertional chest pain, orthopnea PND, or leg swelling Pt denies any increase in rescue therapy over baseline, denies waking up needing it or having any early am or nocturnal exacerbations of coughing/wheezing/or dyspnea.   Preventive  Screening-Counseling & Management  Alcohol-Tobacco     Alcohol drinks/day: 1     Alcohol type: beer     Smoking Status: quit > 6 months     Year Quit: 1994  Current Medications (verified): 1)  Toprol Xl 50 Mg  Tb24 (Metoprolol Succinate) .... Take 1 Tablet By Mouth Once A Day 2)  Adult Aspirin Low Strength 81 Mg  Tbdp (Aspirin) .... Take 1 Tablet By Mouth Once A Day 3)  Amitriptyline Hcl 50 Mg Tabs (Amitriptyline Hcl) .... Take 1 Tablet By Mouth Once A Day 4)  Avapro 150 Mg Tabs (Irbesartan) .[name] Kitchen.. 1 By Mouth Daily 5)  Lunesta 3 Mg Tabs (Eszopiclone) .... Take 1 Tab By Mouth At Bedtime 6)  Zocor 40 Mg Tabs (Simvastatin) ....  Once Daily 7)  Anti-Oxidant   Tabs (Multiple Vitamin) .... Once Daily 8)  Calcium 600 1500 Mg  Tabs (Calcium Carbonate) .... Take 1 Am and At Bedtime 9)  Zetia 10 Mg Tabs (Ezetimibe) .[name] Kitchen.. 1 By Mouth Daily 10)  Spiriva Handihaler 18 Mcg Caps (Tiotropium Bromide Monohydrate) .[name] Kitchen.. 1 Capsule Once Daily 11)  Qvar 80 Mcg/act Aers (Beclomethasone Dipropionate) .... 2  Puffs Two Times A Day 12)  Oxygen .[name] Kitchen.. 4l Rest/exertion 13)  Azathioprine 50 Mg Tabs (Azathioprine) .... Take 4 By Mouth Daily 14)  Melatonin 3 Mg Tabs (Melatonin) .... 3 At Bedtime As Needed 15)  Actonel 35 Mg Tabs (Risedronate Sodium) .... Once Weekly  Allergies (verified): No Known Drug Allergies  Past History:  Past medical, surgical, family and social histories (including risk factors) reviewed, and no changes noted (except as noted below).  Past Medical History: Reviewed history from 04/13/2009 and no changes required. CORONARY HEART DISEASE (ICD-414.00) AMI 2004 with stents ARTHRITIS, RHEUMATOID (ICD-714.0) EMPHYSEMA (ICD-492.8) PULMONARY FIBROSIS (ICD-515)    -TLC 69%  DLCO 68% FeV1 97%  2009    -TLC 74%  DLCO 66% FeV1 83% 2010    -CT Chest :UIP and paraseptal centrilobular emphysema 2009    -CT Chest :  03/2008 UIP and paraseptal centrilobular emphysema sl worse compared to 2009    - walk: 1700 ft,  no desat HX OFASTHMATIC BRONCHITIS, ACUTE (ICD-466.0) COPD (ICD-496)  Past Surgical History: Reviewed history from 09/10/2007 and no changes required. stents with AMI 2004 Left foot surgery  Vericose vein surgery  Past Pulmonary History:  Pulmonary History: EMPHYSEMA (ICD-492.8) PULMONARY FIBROSIS (ICD-515)    -TLC 69%  DLCO 68% FeV1 97%  2009    -TLC 74%  DLCO 66% FeV1 83% 2010    -CT Chest :UIP and paraseptal centrilobular emphysema 2009    -CT Chest :  03/2008 UIP and paraseptal centrilobular emphysema sl worse compared to 2009    - walk: 1700 ft,  no desat HX OFASTHMATIC BRONCHITIS, ACUTE  (ICD-466.0) COPD (ICD-496)  Family History: Reviewed history from 11/18/2007 and no changes required. sister breast ca colon ca father in his 36's AMI mother Pulmonary Fibrosis-brother passed away 10/26/22  Social History: Reviewed history from 11/18/2007 and no changes required. Patient states former smoker. Quit approx 59yrs ago. Smoked x 30 yrs upto 3ppd. Pt is married. Pt is retired Medical illustrator. Alcohol Use - yes Daily Caffeine Use Patient is a former smoker.   Review of Systems       The patient complains of shortness of breath with activity, productive cough, non-productive cough, and hand/feet swelling.  The patient denies shortness of breath at rest, coughing up blood, chest pain, irregular heartbeats, acid heartburn, indigestion, loss of  appetite, weight change, abdominal pain, difficulty swallowing, sore throat, tooth/dental problems, headaches, nasal congestion/difficulty breathing through nose, sneezing, itching, ear ache, anxiety, depression, joint stiffness or pain, rash, change in color of mucus, and fever.    Vital Signs:  Patient profile:   77 year old male Height:      73 inches Weight:      180.50 pounds BMI:     23.90 O2 Sat:      100 % on 4 L/minpulsed Temp:     97.5 degrees F oral Pulse rate:   88 / minute BP sitting:   120 / 70  (left arm) Cuff size:   regular  Vitals Entered By: Gweneth Dimitri RN (August 17, 2009 9:34 AM)  O2 Flow:  4 L/minpulsed CC: 2 month follow up.  States breathing is the same since last OV.  States he does have a prod cough with yellow mucus.  Denies wheezing and chest tightness.   Comments Medications reviewed with patient Daytime contact number verified with patient. Gweneth Dimitri RN  August 17, 2009 9:33 AM    Physical Exam  Additional Exam:  Gen: Pleasant, well-nourished, in no distress , normal affect ENT: no lesions, no post nasal drip Neck: No JVD, no TMG, no carotid bruits Lungs: No use of accessory muscles, no dullness to  percussion, dry bibasilar rales no rhonchi now ausculted Cardiovascular: RRR, heart sounds normal, no murmurs or gallops, tr to none  edema on left, none on right, scattered varicosities esp along left lower leg/ankle. venous insufficiency changes. neg homans sign, neg calf pain.  Abdomen: soft and non-tender, no HSM, BS normal Musculoskeletal: No deformities, no cyanosis or clubbing Neuro: alert, non-focal     Impression & Recommendations:  Problem # 1:  PULMONARY FIBROSIS (ICD-515) Assessment Unchanged  Severe autoimmune assoicated pulmonary fibrosis,  with progression of disease, labs were stable 5/11. plan Cont  imuran to 200mg /d cont oxygen   Complete Medication List: 1)  Toprol Xl 50 Mg Tb24 (Metoprolol succinate) .... Take 1 tablet by mouth once a day 2)  Adult Aspirin Low Strength 81 Mg Tbdp (Aspirin) .... Take 1 tablet by mouth once a day 3)  Amitriptyline Hcl 50 Mg Tabs (Amitriptyline hcl) .... Take 1 tablet by mouth once a day 4)  Avapro 150 Mg Tabs (Irbesartan) .[name] Kitchen.. 1 by mouth daily 5)  Lunesta 3 Mg Tabs (Eszopiclone) .... Take 1 tab by mouth at bedtime 6)  Zocor 40 Mg Tabs (Simvastatin) .... Once daily 7)  Anti-oxidant Tabs (Multiple vitamin) .... Once daily 8)  Calcium 600 1500 Mg Tabs (Calcium carbonate) .... Take 1 am and at bedtime 9)  Zetia 10 Mg Tabs (Ezetimibe) .[name] Kitchen.. 1 by mouth daily 10)  Spiriva Handihaler 18 Mcg Caps (Tiotropium bromide monohydrate) .[name] Kitchen.. 1 capsule once daily 11)  Qvar 80 Mcg/act Aers (Beclomethasone dipropionate) .... 2  puffs two times a day 12)  Oxygen  .[name] Kitchen.. 4l rest/exertion 13)  Azathioprine 50 Mg Tabs (Azathioprine) .... Take 4 by mouth daily 14)  Melatonin 3 Mg Tabs (Melatonin) .... 3 at bedtime as needed 15)  Actonel 35 Mg Tabs (Risedronate sodium) .... Once weekly  Other Orders: Est. Patient Level III (16109)  Patient Instructions: 1)  No change in medications 2)  Return in    3      months   Immunization History:  Pneumovax  Immunization History:    Pneumovax:  historical (01/22/2006)   Appended Document: Pulmonary OV fax Ron Nehemiah Settle, Zenovia Jordan

## 2010-02-21 NOTE — Progress Notes (Signed)
Summary: swollen feet  Phone Note Call from Patient   Caller: Patient Call For: Amour Cutrone Summary of Call: pt's foot is swelling . think it may be coming from pulmonary fibrosis Initial call taken by: Rickard Patience,  July 06, 2009 10:15 AM  Follow-up for Phone Call        Pt c/o swelling in his left foot and pain in the arch x 2 days. he states he cannot get a shoe on that foot. He staets this has happened in the past and feels it is due to his pulmonary fibrosis. Pt denies any increaed SOB at this time. Please advise. Carron Curie CMA  July 06, 2009 11:44 AM [facility] [place] [facility]  Additional Follow-up for Phone Call Additional follow up Details #1::        Usually PF causes both feet to swell, not just one.  IF the swelling is limited to just the foot and not going up the leg, would keep elevated for a few days and see if swelling goes down.  If not, then needs ov. Additional Follow-up by: Barbaraann Share MD,  July 06, 2009 5:35 PM    Additional Follow-up for Phone Call Additional follow up Details #2::    Spoke with pt.  Pt informed of above statement per KC.  Pt states swelling is going half way up leg.  Advised bc of this, he needs ov per KC.  Pt ok with this but did not want to drive to [place] to see PW tomorrow.  OV scheduled with KC at 130 for tomorrow.  Pt aware.  Will forward message to PW as FYI.  Gweneth Dimitri RN  July 06, 2009 5:41 PM   Additional Follow-up for Phone Call Additional follow up Details #3:: Details for Additional Follow-up Action Taken: noted and I am ok with this  Additional Follow-up by: Storm Frisk MD,  July 07, 2009 9:05 AM

## 2010-02-21 NOTE — Assessment & Plan Note (Signed)
Summary: Acute NP office visit - left leg edema   Copy to:  Rheum Zenovia Jordan Primary Provider/Referring Provider:  Dr. Renford Dills  CC:  swelling in left foot/ankle and calf and occ shooting pain in arch of foot x2weeks.  History of Present Illness: 77 yowm quit smoker around 1980  history of chronic obstructive airways disease exacerbated by chronic rheumatoid arthritis, associated pulmonary fibrosis,  on 02 since 2008  April 13, 2009 Acute visit.  Pt c/o extreme fatigue x several days and also feels that "blood is rushing to ears".  He also c/o heaviness in chest but denies any pain in chest.  He states that because he is so tired he is having trouble catching his breathing. more tired than sob. no cough.  does have hearburn alot and uses tums maybe 4 x weekly.  arthritis ok. Pt denies any significant sore throat, dysphagia, itching, sneezing,  nasal congestion or excess secretions,  fever, chills, sweats, unintended wt loss, pleuritic or exertional cp, hempoptysis, change in activity tolerance  orthopnea pnd or leg swelling Pt denies any significant sore throat, dysphagia, itching, sneezing,  nasal congestion or excess secretions,  fever, chills, sweats, unintended wt loss, pleuritic or exertional cp, hempoptysis, change in activity tolerance  orthopnea pnd or leg swelling   April 27, 2009 3:18 PM Pt noting c/o electric shock in shoulder.  The pt  is not sleeping  only 4hrs/night,   The R chest wall feels like sand paper.  Actonel was stopped due to concern of GERD at 3/23 OV.  ESR last OV was elevated  There is no change in cough,  not as active, more fatigued.  The  heartburn is worse and is using prevacid.  Pt still with heaviness in chest.   Fatigue is the largest complaint.   Pt denies any significant sore throat, nasal congestion or excess secretions, fever, chills, sweats, unintended weight loss, pleurtic or exertional chest pain, orthopnea PND, or leg swelling Pt denies any increase  in rescue therapy over baseline, denies waking up needing it or having any early am or nocturnal exacerbations of coughing/wheezing/or dyspnea.  Note that later in the evening after this OV the pt went to [facility] ED and admitted 4/6 -4/8 for r/o MI.  All enzymes were negative.  Cxr stable KUB showed severe obstipation and ileus.  Pt d/c on miralax for bowels.  Pt did not relate to me the no bms for 7-10days with the earlier OV   Jun 21, 2009 9:48 AM Pt now noting wheezing and dyspnea worsening.   Notes more dyspnea with any activity.  More mucus and is yellow and pasty.  Mucus is thick and hard to raise.  No real chest pain.  Sl edema in L foot.  Bowels ok now.  Pt was in hosp 4/6 -4/8 for obstipation and AMI was ruled out.   During 4/11 hosp stay : WBC 5.0 and LFTs normal 4/11.  CXR was stable with pulm fibrosis 4/11 Pt denies any significant sore throat, nasal congestion , fever, chills, sweats, unintended weight loss, pleurtic or exertional chest pain, orthopnea PND, or leg swelling. Pt remains on Spiriva and Qvar.  Pt is not on any prednisone.  Pt is on 200mg  /d azathioprine.  07/07/09--Presents for an acute office visit. Complains of swelling in left foot/ankle and calf, occ shooting pain in arch of foot x2weeks. Pt complains that his left foot gets puffy in afternoons , bottom of foot hurts from time to time.  Gets mild swelling in right but minimall, not as bad on left. No new meds, injury , recent travel of change in breathing. Denies chest pain,  orthopnea, hemoptysis, fever, n/v/d, headache.    Medications Prior to Update: 1)  Toprol Xl 50 Mg  Tb24 (Metoprolol Succinate) .... Take 1 Tablet By Mouth Once A Day 2)  Adult Aspirin Low Strength 81 Mg  Tbdp (Aspirin) .... Take 1 Tablet By Mouth Once A Day 3)  Amitriptyline Hcl 50 Mg Tabs (Amitriptyline Hcl) .... Take 1 Tablet By Mouth Once A Day 4)  Avapro 150 Mg Tabs (Irbesartan) .[name] Kitchen.. 1 By Mouth Daily 5)  Lunesta 3 Mg Tabs (Eszopiclone) .... Take  1 Tab By Mouth At Bedtime 6)  Zocor 40 Mg Tabs (Simvastatin) .... Once Daily 7)  Anti-Oxidant   Tabs (Multiple Vitamin) .... Once Daily 8)  Calcium 600 1500 Mg  Tabs (Calcium Carbonate) .... Take 1 Am and At Bedtime 9)  Zetia 10 Mg Tabs (Ezetimibe) .[name] Kitchen.. 1 By Mouth Daily 10)  Spiriva Handihaler 18 Mcg Caps (Tiotropium Bromide Monohydrate) .[name] Kitchen.. 1 Capsule Once Daily 11)  Qvar 80 Mcg/act Aers (Beclomethasone Dipropionate) .... 2  Puffs Two Times A Day 12)  Oxygen .[name] Kitchen.. 4l Rest/exertion 13)  Azathioprine 50 Mg Tabs (Azathioprine) .... Take 4 By Mouth Daily 14)  Melatonin 3 Mg Tabs (Melatonin) .... 3 At Bedtime As Needed 15)  Actonel 35 Mg Tabs (Risedronate Sodium) .... Once Weekly  Current Medications (verified): 1)  Toprol Xl 50 Mg  Tb24 (Metoprolol Succinate) .... Take 1 Tablet By Mouth Once A Day 2)  Adult Aspirin Low Strength 81 Mg  Tbdp (Aspirin) .... Take 1 Tablet By Mouth Once A Day 3)  Amitriptyline Hcl 50 Mg Tabs (Amitriptyline Hcl) .... Take 1 Tablet By Mouth Once A Day 4)  Avapro 150 Mg Tabs (Irbesartan) .[name] Kitchen.. 1 By Mouth Daily 5)  Lunesta 3 Mg Tabs (Eszopiclone) .... Take 1 Tab By Mouth At Bedtime 6)  Zocor 40 Mg Tabs (Simvastatin) .... Once Daily 7)  Anti-Oxidant   Tabs (Multiple Vitamin) .... Once Daily 8)  Calcium 600 1500 Mg  Tabs (Calcium Carbonate) .... Take 1 Am and At Bedtime 9)  Zetia 10 Mg Tabs (Ezetimibe) .[name] Kitchen.. 1 By Mouth Daily 10)  Spiriva Handihaler 18 Mcg Caps (Tiotropium Bromide Monohydrate) .[name] Kitchen.. 1 Capsule Once Daily 11)  Qvar 80 Mcg/act Aers (Beclomethasone Dipropionate) .... 2  Puffs Two Times A Day 12)  Oxygen .[name] Kitchen.. 4l Rest/exertion 13)  Azathioprine 50 Mg Tabs (Azathioprine) .... Take 4 By Mouth Daily 14)  Melatonin 3 Mg Tabs (Melatonin) .... 3 At Bedtime As Needed 15)  Actonel 35 Mg Tabs (Risedronate Sodium) .... Once Weekly  Allergies (verified): No Known Drug Allergies  Past History:  Past Medical History: Last updated: 04/13/2009 CORONARY HEART DISEASE  (ICD-414.00) AMI 2004 with stents ARTHRITIS, RHEUMATOID (ICD-714.0) EMPHYSEMA (ICD-492.8) PULMONARY FIBROSIS (ICD-515)    -TLC 69%  DLCO 68% FeV1 97%  2009    -TLC 74%  DLCO 66% FeV1 83% 2010    -CT Chest :UIP and paraseptal centrilobular emphysema 2009    -CT Chest :  03/2008 UIP and paraseptal centrilobular emphysema sl worse compared to 2009    - walk: 1700 ft,  no desat HX OFASTHMATIC BRONCHITIS, ACUTE (ICD-466.0) COPD (ICD-496)  Past Surgical History: Last updated: 09/10/2007 stents with AMI 2004 Left foot surgery  Vericose vein surgery  Family History: Last updated: 11/18/2007 sister breast ca colon ca father in his 49's AMI mother Pulmonary Fibrosis-brother  passed away 9/09  Social History: Last updated: 11/18/2007 Patient states former smoker. Quit approx 4yrs ago. Smoked x 30 yrs upto 3ppd. Pt is married. Pt is retired Medical illustrator. Alcohol Use - yes Daily Caffeine Use Patient is a former smoker.   Risk Factors: Alcohol Use: 1 (09/10/2007) Caffeine Use: 4 (09/10/2007)  Risk Factors: Smoking Status: quit > 6 months (06/21/2009)  Past Pulmonary History:  Pulmonary History: EMPHYSEMA (ICD-492.8) PULMONARY FIBROSIS (ICD-515)    -TLC 69%  DLCO 68% FeV1 97%  2009    -TLC 74%  DLCO 66% FeV1 83% 2010    -CT Chest :UIP and paraseptal centrilobular emphysema 2009    -CT Chest :  03/2008 UIP and paraseptal centrilobular emphysema sl worse compared to 2009    - walk: 1700 ft,  no desat HX OFASTHMATIC BRONCHITIS, ACUTE (ICD-466.0) COPD (ICD-496)  Review of Systems      See HPI  Vital Signs:  Patient profile:   77 year old male Height:      73 inches Weight:      177 pounds BMI:     23.44 O2 Sat:      98 % on 4 L/min pulsing Temp:     97.6 degrees F oral Pulse rate:   83 / minute BP sitting:   124 / 86  (left arm) Cuff size:   regular  Vitals Entered By: Boone Master CNA/MA (July 07, 2009 11:15 AM)  O2 Flow:  4 L/min pulsing CC: swelling in left  foot/ankle and calf, occ shooting pain in arch of foot x2weeks Is Patient Diabetic? No Comments Medications reviewed with patient Daytime contact number verified with patient. Boone Master CNA/MA  July 07, 2009 11:15 AM    Physical Exam  Additional Exam:  Gen: Pleasant, well-nourished, in no distress , normal affect ENT: no lesions, no post nasal drip Neck: No JVD, no TMG, no carotid bruits Lungs: No use of accessory muscles, no dullness to percussion, dry bibasilar rales R >> L seems worse vs 4/11 OV.  Scattered rhonchi noted. Cardiovascular: RRR, heart sounds normal, no murmurs or gallops, tr to none  edema on left, none on right, scattered varicosities esp along left lower leg/ankle. venous insufficiency changes. neg homans sign, neg calf pain.  Abdomen: soft and non-tender, no HSM, BS normal Musculoskeletal: No deformities, no cyanosis or clubbing Neuro: alert, non-focal     Impression & Recommendations:  Problem # 1:  EDEMA (ICD-782.3)  Minimal lower extremity edema mainly on left- he has trace edema on left w/ scattered varicosities, otherwise exam is unrevealing suspect this is related to venous insufficiency. He does not have other contributing symptioms. he is on multiple medications, do not feel diuretics are indicated at this time will hold for now. explained if this worsens will need futher evaluation /tx.  REC:  Low salt diet.  Keep legs elevated, support socks wear daily and remove at bedtime.  Please contact office for sooner follow up if symptoms do not improve or worsen  follow up Dr. Delford Field as scheduled and as needed   Orders: Est. Patient Level II (18841)  Complete Medication List: 1)  Toprol Xl 50 Mg Tb24 (Metoprolol succinate) .... Take 1 tablet by mouth once a day 2)  Adult Aspirin Low Strength 81 Mg Tbdp (Aspirin) .... Take 1 tablet by mouth once a day 3)  Amitriptyline Hcl 50 Mg Tabs (Amitriptyline hcl) .... Take 1 tablet by mouth once a day 4)  Avapro  150 Mg Tabs (Irbesartan) .[name] KitchenMarland KitchenMarland Kitchen  1 by mouth daily 5)  Lunesta 3 Mg Tabs (Eszopiclone) .... Take 1 tab by mouth at bedtime 6)  Zocor 40 Mg Tabs (Simvastatin) .... Once daily 7)  Anti-oxidant Tabs (Multiple vitamin) .... Once daily 8)  Calcium 600 1500 Mg Tabs (Calcium carbonate) .... Take 1 am and at bedtime 9)  Zetia 10 Mg Tabs (Ezetimibe) .[name] Kitchen.. 1 by mouth daily 10)  Spiriva Handihaler 18 Mcg Caps (Tiotropium bromide monohydrate) .[name] Kitchen.. 1 capsule once daily 11)  Qvar 80 Mcg/act Aers (Beclomethasone dipropionate) .... 2  puffs two times a day 12)  Oxygen  .[name] Kitchen.. 4l rest/exertion 13)  Azathioprine 50 Mg Tabs (Azathioprine) .... Take 4 by mouth daily 14)  Melatonin 3 Mg Tabs (Melatonin) .... 3 at bedtime as needed 15)  Actonel 35 Mg Tabs (Risedronate sodium) .... Once weekly  Patient Instructions: 1)  Low salt diet.  2)  Keep legs elevated, support socks wear daily and remove at bedtime.  3)  Please contact office for sooner follow up if symptoms do not improve or worsen  4)  follow up Dr. Delford Field as scheduled and as needed   Appended Document: Acute NP office visit - left leg edema I agree with this evaluation pw

## 2010-02-21 NOTE — Progress Notes (Signed)
Summary: increase med  Phone Note Call from Patient   Caller: Patient Call For: wright Summary of Call: pt would like to know if lunest dose can be increased. Initial call taken by: Rickard Patience,  June 23, 2009 10:16 AM  Follow-up for Phone Call        Pt states Lunesta 2 mg is not working for him so he has began to take ambien 2.5 mg along with his 2mg  of lunesta at night in order to get some sleep. the Remus Loffler is his wifes. Pt states he will finish up this months rx, but wants a new rx written for Lunesta 3mg  tabs to start on next month to see if the increased dose works for him. Pt aware PW out of office until 6-13. Ok to wait until then.  please advise. Carron Curie CMA  June 23, 2009 11:59 AM  [facility] [facility] [address]  Additional Follow-up for Phone Call Additional follow up Details #1::        this is ok   remember inam on vacation  Additional Follow-up by: Storm Frisk MD,  June 30, 2009 1:19 PM    Additional Follow-up for Phone Call Additional follow up Details #2::    rx sent. pt aware.Carron Curie CMA  June 30, 2009 1:25 PM   New/Updated Medications: LUNESTA 3 MG TABS (ESZOPICLONE) Take 1 tab by mouth at bedtime Prescriptions: LUNESTA 3 MG TABS (ESZOPICLONE) Take 1 tab by mouth at bedtime  #30 x 1   Entered by:   Carron Curie CMA   Authorized by:   Storm Frisk MD   Signed by:   Carron Curie CMA on 06/30/2009   Method used:   Telephoned to ...       [facility] (retail)       [address]       [place], [place]  [address]       Ph: [phone] or [phone]       Fax: [phone]   RxID:   [id number]

## 2010-02-21 NOTE — Medication Information (Signed)
Summary: Tax adviser   Imported By: Lehman Prom 06/30/2009 14:28:06  _____________________________________________________________________  External Attachment:    Type:   Image     Comment:   External Document

## 2010-02-21 NOTE — Letter (Signed)
Summary: Certificate of Medical Necessity for Oxygen/[facility]  Certificate of Medical Necessity for Oxygen/[facility]   Imported By: Maryln Gottron 04/18/2009 13:40:17  _____________________________________________________________________  External Attachment:    Type:   Image     Comment:   External Document

## 2010-02-21 NOTE — Assessment & Plan Note (Signed)
Summary: Pulmonary OV   Copy to:  Rheum Zenovia Jordan Primary Provider/Referring Provider:  Dr. Hyacinth Meeker @ [phone]  CC:  3 month COPD follow up.  Pt states breathing is doing "poorly."  States he does have a prod cough with yellow mucus.  Denies wheezing and chest tightness.  Would like to discuss Qvar instructions..  History of Present Illness: 77 yowm quit smoker around 1980  history of chronic obstructive airways disease exacerbated by chronic rheumatoid arthritis, associated pulmonary fibrosis,  on 02 since 2008  April 13, 2009 Acute visit.  Pt c/o extreme fatigue x several days and also feels that "blood is rushing to ears".  He also c/o heaviness in chest but denies any pain in chest.  He states that because he is so tired he is having trouble catching his breathing. more tired than sob. no cough.  does have hearburn alot and uses tums maybe 4 x weekly.  arthritis ok. Pt denies any significant sore throat, dysphagia, itching, sneezing,  nasal congestion or excess secretions,  fever, chills, sweats, unintended wt loss, pleuritic or exertional cp, hempoptysis, change in activity tolerance  orthopnea pnd or leg swelling Pt denies any significant sore throat, dysphagia, itching, sneezing,  nasal congestion or excess secretions,  fever, chills, sweats, unintended wt loss, pleuritic or exertional cp, hempoptysis, change in activity tolerance  orthopnea pnd or leg swelling   April 27, 2009 3:18 PM Pt noting c/o electric shock in shoulder.  The pt  is not sleeping  only 4hrs/night,   The R chest wall feels like sand paper.  Actonel was stopped due to concern of GERD at 3/23 OV.  ESR last OV was elevated  There is no change in cough,  not as active, more fatigued.  The  heartburn is worse and is using prevacid.  Pt still with heaviness in chest.   Fatigue is the largest complaint.   Pt denies any significant sore throat, nasal congestion or excess secretions, fever, chills, sweats, unintended weight  loss, pleurtic or exertional chest pain, orthopnea PND, or leg swelling Pt denies any increase in rescue therapy over baseline, denies waking up needing it or having any early am or nocturnal exacerbations of coughing/wheezing/or dyspnea.  Note that later in the evening after this OV the pt went to [facility] ED and admitted 4/6 -4/8 for r/o MI.  All enzymes were negative.  Cxr stable KUB showed severe obstipation and ileus.  Pt d/c on miralax for bowels.  Pt did not relate to me the no bms for 7-10days with the earlier OV   Current Medications (verified): 1)  Toprol Xl 50 Mg  Tb24 (Metoprolol Succinate) .... Take 1 Tablet By Mouth Once A Day 2)  Adult Aspirin Low Strength 81 Mg  Tbdp (Aspirin) .... Take 1 Tablet By Mouth Once A Day 3)  Amitriptyline Hcl 50 Mg Tabs (Amitriptyline Hcl) .... Take 1 Tablet By Mouth Once A Day 4)  Avapro 150 Mg Tabs (Irbesartan) .[name] Kitchen.. 1 By Mouth Daily 5)  Zolpidem Tartrate 10 Mg Tabs (Zolpidem Tartrate) .... Take 1 Tab By Mouth At Bedtime As Needed 6)  Zocor 40 Mg Tabs (Simvastatin) .... Once Daily 7)  Anti-Oxidant   Tabs (Multiple Vitamin) .... Once Daily 8)  Calcium 600 1500 Mg  Tabs (Calcium Carbonate) .... Take 1 Am and At Bedtime 9)  Zetia 10 Mg Tabs (Ezetimibe) .[name] Kitchen.. 1 By Mouth Daily 10)  Spiriva Handihaler 18 Mcg Caps (Tiotropium Bromide Monohydrate) .[name] Kitchen.. 1 Capsule Once Daily 11)  Qvar 80 Mcg/act Aers (Beclomethasone Dipropionate) .... 3 Puffs Two Times A Day 12)  Oxygen .[name] Kitchen.. 4l Rest/exertion 13)  Imuran 150 Mg .[name] Kitchen.. 1 Once Daily 14)  Melatonin 3 Mg Tabs (Melatonin) .... 3 At Bedtime As Needed  Allergies (verified): No Known Drug Allergies  Past History:  Past medical, surgical, family and social histories (including risk factors) reviewed, and no changes noted (except as noted below).  Past Medical History: Reviewed history from 04/13/2009 and no changes required. CORONARY HEART DISEASE (ICD-414.00) AMI 2004 with stents ARTHRITIS, RHEUMATOID  (ICD-714.0) EMPHYSEMA (ICD-492.8) PULMONARY FIBROSIS (ICD-515)    -TLC 69%  DLCO 68% FeV1 97%  2009    -TLC 74%  DLCO 66% FeV1 83% 2010    -CT Chest :UIP and paraseptal centrilobular emphysema 2009    -CT Chest :  03/2008 UIP and paraseptal centrilobular emphysema sl worse compared to 2009    - walk: 1700 ft,  no desat HX OFASTHMATIC BRONCHITIS, ACUTE (ICD-466.0) COPD (ICD-496)  Past Surgical History: Reviewed history from 09/10/2007 and no changes required. stents with AMI 2004 Left foot surgery  Vericose vein surgery  Past Pulmonary History:  Pulmonary History: EMPHYSEMA (ICD-492.8) PULMONARY FIBROSIS (ICD-515)    -TLC 69%  DLCO 68% FeV1 97%  2009    -TLC 74%  DLCO 66% FeV1 83% 2010    -CT Chest :UIP and paraseptal centrilobular emphysema 2009    -CT Chest :  03/2008 UIP and paraseptal centrilobular emphysema sl worse compared to 2009    - walk: 1700 ft,  no desat HX OFASTHMATIC BRONCHITIS, ACUTE (ICD-466.0) COPD (ICD-496)  Family History: Reviewed history from 11/18/2007 and no changes required. sister breast ca colon ca father in his 57's AMI mother Pulmonary Fibrosis-brother passed away 26-Oct-2022  Social History: Reviewed history from 11/18/2007 and no changes required. Patient states former smoker. Quit approx 70yrs ago. Smoked x 30 yrs upto 3ppd. Pt is married. Pt is retired Medical illustrator. Alcohol Use - yes Daily Caffeine Use Patient is a former smoker.   Review of Systems       The patient complains of shortness of breath with activity, non-productive cough, and chest pain.  The patient denies shortness of breath at rest, productive cough, coughing up blood, irregular heartbeats, acid heartburn, indigestion, loss of appetite, weight change, abdominal pain, difficulty swallowing, sore throat, tooth/dental problems, headaches, nasal congestion/difficulty breathing through nose, sneezing, itching, ear ache, anxiety, depression, hand/feet swelling, joint stiffness or  pain, rash, change in color of mucus, and fever.         severe fatigue  Vital Signs:  Patient profile:   77 year old male Height:      73 inches Weight:      182.38 pounds BMI:     24.15 O2 Sat:      91 % on 4 L/minpulsed Temp:     97.4 degrees F oral Pulse rate:   88 / minute BP sitting:   142 / 80  (left arm) Cuff size:   regular  Vitals Entered By: Gweneth Dimitri RN (April 27, 2009 2:57 PM)  O2 Flow:  4 L/minpulsed CC: 3 month COPD follow up.  Pt states breathing is doing "poorly."  States he does have a prod cough with yellow mucus.  Denies wheezing and chest tightness.  Would like to discuss Qvar instructions. Comments Medications reviewed with patient Daytime contact number verified with patient. Gweneth Dimitri RN  April 27, 2009 2:57 PM    Physical Exam  Additional Exam:  Gen: Pleasant, well-nourished, in no distress , normal affect ENT: no lesions, no post nasal drip Neck: No JVD, no TMG, no carotid bruits Lungs: No use of accessory muscles, no dullness to percussion, dry bibasilar rales R >> L Cardiovascular: RRR, heart sounds normal, no murmurs or gallops, no peripheral edema Abdomen: soft and non-tender, no HSM, BS normal Musculoskeletal: No deformities, no cyanosis or clubbing Neuro: alert, non-focal     Impression & Recommendations:  Problem # 1:  PULMONARY FIBROSIS (ICD-515) Assessment Unchanged Severe autoimmune assoicated pulmonary fibrosis,  with progression of disease plan increase imuran to 200mg /d f/u labs repulse  prednisone  ok to resume actonel cont oxygen  trial lunesta for insomnia  Medications Added to Medication List This Visit: 1)  Lunesta 2 Mg Tabs (Eszopiclone) .... Take one by mouth at bedtime as needed sleep 2)  Qvar 80 Mcg/act Aers (Beclomethasone dipropionate) .... 2  puffs two times a day 3)  Azathioprine 50 Mg Tabs (Azathioprine) .... Take 4 by mouth daily 4)  Prevacid 24hr 15 Mg Cpdr (Lansoprazole) .... One by mouth day 5)   Prednisone 10 Mg Tabs (Prednisone) .... Take as directed 4 each am x3days, 3 x 3days, 2 x 3days, 1 x 3days then stop  Complete Medication List: 1)  Toprol Xl 50 Mg Tb24 (Metoprolol succinate) .... Take 1 tablet by mouth once a day 2)  Adult Aspirin Low Strength 81 Mg Tbdp (Aspirin) .... Take 1 tablet by mouth once a day 3)  Amitriptyline Hcl 50 Mg Tabs (Amitriptyline hcl) .... Take 1 tablet by mouth once a day 4)  Avapro 150 Mg Tabs (Irbesartan) .[name] Kitchen.. 1 by mouth daily 5)  Lunesta 2 Mg Tabs (Eszopiclone) .... Take one by mouth at bedtime as needed sleep 6)  Zocor 40 Mg Tabs (Simvastatin) .... Once daily 7)  Anti-oxidant Tabs (Multiple vitamin) .... Once daily 8)  Calcium 600 1500 Mg Tabs (Calcium carbonate) .... Take 1 am and at bedtime 9)  Zetia 10 Mg Tabs (Ezetimibe) .[name] Kitchen.. 1 by mouth daily 10)  Spiriva Handihaler 18 Mcg Caps (Tiotropium bromide monohydrate) .[name] Kitchen.. 1 capsule once daily 11)  Qvar 80 Mcg/act Aers (Beclomethasone dipropionate) .... 2  puffs two times a day 12)  Oxygen  .[name] Kitchen.. 4l rest/exertion 13)  Azathioprine 50 Mg Tabs (Azathioprine) .... Take 4 by mouth daily 14)  Melatonin 3 Mg Tabs (Melatonin) .... 3 at bedtime as needed 15)  Prevacid 24hr 15 Mg Cpdr (Lansoprazole) .... One by mouth day 16)  Prednisone 10 Mg Tabs (Prednisone) .... Take as directed 4 each am x3days, 3 x 3days, 2 x 3days, 1 x 3days then stop  Other Orders: Est. Patient Level IV (11914)  Patient Instructions: 1)  Prednisone 4 each am x3days, 3 x 3days, 2 x 3days, 1 x 3days then stop 2)  Increase imuran 200mg  daily ( 4- 50mg  tab) 3)  You may resume actonel 4)  No change in oxygen 5)  Qvar is two puff twice daily 6)  Stay on prevacid one daily  (over the counter) 7)  Return one month Prescriptions: LUNESTA 2 MG TABS (ESZOPICLONE) Take one by mouth at bedtime as needed sleep  #30 x 1   Entered and Authorized by:   Storm Frisk MD   Signed by:   Storm Frisk MD on 04/27/2009   Method used:   Print  then Give to Patient   RxID:   [id number] PREDNISONE 10 MG  TABS (PREDNISONE) Take as directed 4 each am x3days,  3 x 3days, 2 x 3days, 1 x 3days then stop  #30 x 0   Entered and Authorized by:   Storm Frisk MD   Signed by:   Storm Frisk MD on 04/27/2009   Method used:   Electronically to        [facility] [address] (retail)       [address]       [place], [place]  [address]       Ph: [phone] or [phone]       Fax: [phone]   RxID:   [id number] AZATHIOPRINE 50 MG TABS (AZATHIOPRINE) Take 4 by mouth daily  #1 month x 6   Entered and Authorized by:   Storm Frisk MD   Signed by:   Storm Frisk MD on 04/27/2009   Method used:   Electronically to        [facility] [address] (retail)       [address]       [place], [place]  [address]       Ph: [phone] or [phone]       Fax: [phone]   RxID:   [id number]   Appended Document: Pulmonary OV fax Zenovia Jordan

## 2010-02-22 ENCOUNTER — Telehealth: Payer: Self-pay | Admitting: Critical Care Medicine

## 2010-02-22 LAB — HEPATIC FUNCTION PANEL
Albumin: 3.8 g/dL (ref 3.5–5.2)
Bilirubin, Direct: 0.1 mg/dL (ref 0.0–0.3)
Total Protein: 6.6 g/dL (ref 6.0–8.3)

## 2010-02-23 NOTE — Progress Notes (Signed)
Summary: Azathioprine prior auth approved thru 02/08/2012  Phone Note Outgoing Call   Call placed by: Gweneth Dimitri RN,  February 07, 2010 10:51 AM Call placed to: [facility] Summary of Call: Received prior auth request for azathioprine 50mg  take 4 tablets by mouth once daily from [address]  [employer], spoke with Larita Fife.  She will fax form to traige fax number.   Initial call taken by: Gweneth Dimitri RN,  February 07, 2010 10:53 AM  Follow-up for Phone Call        Form received an placed in PW's to do folder. Gweneth Dimitri RN  February 07, 2010 11:13 AM  Crystal do you know if PW has done this? Thanks. Zackery Barefoot CMA  February 08, 2010 4:08 PM   Forms completed by PW and faxed back to [facility] at [phone].  Forms placed in triage with the awaiting approval/denial forms. Follow-up by: Gweneth Dimitri RN,  February 08, 2010 4:18 PM  Additional Follow-up for Phone Call Additional follow up Details #1::        Azathioprine 50mg  tablets approved until 02/08/2012. I called and informed [facility] and pt. Form given to [employer]. to scan into chart. Zackery Barefoot CMA  February 09, 2010 9:50 AM

## 2010-02-23 NOTE — Medication Information (Signed)
Summary: Tax adviser   Imported By: Valinda Hoar 02/09/2010 10:57:32  _____________________________________________________________________  External Attachment:    Type:   Image     Comment:   External Document

## 2010-03-01 NOTE — Progress Notes (Signed)
Summary: Labs normal  Phone Note Outgoing Call   Reason for Call: Discuss lab or test results Summary of Call: call pt and tell him all labs are normal no med changes  Initial call taken by: Storm Frisk MD,  February 22, 2010 12:29 PM  Follow-up for Phone Call        Called, spoke with pt. He was informed of above lab results and recs per PW and verbalized understanding. Follow-up by: Gweneth Dimitri RN,  February 22, 2010 2:45 PM

## 2010-03-01 NOTE — Assessment & Plan Note (Addendum)
Summary: Pulmonary OV   Copy to:  Rhe[facility]ov[facility] Jordan Primary Provider/Referring Provider:  Dr. Renford Dills  CC:  3 month follow up.  Pt states breathing is unchanged from last OV.  SOB with exetion and prod cough with clear to yellow mucus.  Denies wheezing and chest tightness. [name] Kitchen  History of Present Illness: 77 yowm quit smoker around 1980  history of chronic obstructive airways disease exacerbated by chronic rheumatoid arthritis, associated pulmonary fibrosis,  on 02 since 2008   August 17, 2009 9:39 AM The pt now notes some yellow mucus  sl more than before.  No chest pain.  Dyspnea is the same, not able to exert self.   No new issues. Edema is better with support hose.  No chest pain.      October 24, 2009 --Pt presents for work in viisit. He has several issues today. Says his breathing is at baseline. Complains of constant heartburn with belching and reflux. Uses tums with some help but keeps coming back. Has daily constipation with hard stools. Uses miralax and senokot as needed. He does have chronic insominia and uses lunesta and melatonin at bedtime , Has also been taking his wife;s ambien -takes 20mg  in addition to lunesta and melatonin. I advised him in the dangers of combining sleep aides. and we discussed several options. Have advised him to disucss this with his PCP for further recommendations. Denies chest pain, dyspnea, orthopnea, hemoptysis, fever, n/v/d, edema, headache,bloody stools, weight loss, syncope, daytime hypersolomennece.  He has tapered off elavil 2-3 weeks ago, does not want to restart , advised to discuss with Dr. Nehemiah Settle.   November 15, 2009 10:42 AM The pt over planted in the yard    and is weak but at baseline.  Not much cough now.  No real wheeze.  Not much heartburn,  and constipation is better and this helps.  No new issues. Pt denies any significant sore throat, nasal congestion or excess secretions, fever, chills, sweats, unintended weight loss, pleurtic or  exertional chest pain, orthopnea PND, or leg swelling February 21, 2010 4:07 PM Not much cough since 10/11.  Min cough.  Not much wheeze.  No real chest pain.  No edema in feet.  No joint issues.  No real change in dyspnea.   No new issues.  Pt denies any significant sore throat, nasal congestion or excess secretions, fever, chills, sweats, unintended weight loss, pleurtic or exertional chest pain, orthopnea PND, or leg swelling Pt denies any increase in rescue therapy over baseline, denies waking up needing it or having any early am or nocturnal exacerbations of coughing/wheezing/or dyspnea.   Preventive Screening-Counseling & Management  Alcohol-Tobacco     Alcohol drinks/day: 1     Alcohol type: beer     Smoking Status: quit > 6 months     Year Quit: 1994  Current Medications (verified): 1)  Toprol Xl 50 Mg  Tb24 (Metoprolol Succinate) .... Take 1 Tablet By Mouth Once A Day 2)  Adult Aspirin Low Strength 81 Mg  Tbdp (Aspirin) .... Take 1 Tablet By Mouth Once A Day 3)  Avapro 150 Mg Tabs (Irbesartan) .[name] Kitchen.. 1 By Mouth Daily 4)  Zocor 40 Mg Tabs (Simvastatin) .... Once Daily 5)  Anti-Oxidant   Tabs (Multiple Vitamin) .... Once Daily 6)  Calcium 600 1500 Mg  Tabs (Calcium Carbonate) .... Take 1 Am and At Bedtime 7)  Zetia 10 Mg Tabs (Ezetimibe) .[name] Kitchen.. 1 By Mouth Daily 8)  Spiriva Handihaler 18 Mcg  Caps (Tiotropium Bromide Monohydrate) .[name] Kitchen.. 1 Capsule Once Daily 9)  Qvar 80 Mcg/act Aers (Beclomethasone Dipropionate) .... 2  Puffs Two Times A Day 10)  Oxygen .[name] Kitchen.. 4l Rest/exertion 11)  Azathioprine 50 Mg Tabs (Azathioprine) .... Take 4 By Mouth Daily 12)  Nexium 40 Mg Cpdr (Esomeprazole Magnesium) .[name] Kitchen.. 1 By Mouth Once Daily 13)  Restoril 15 Mg Caps (Temazepam) .... Take 1 Tab By Mouth At Bedtime 14)  Pepcid Ac Maximum Strength 20 Mg Chew (Famotidine) .... One By Mouth Daily 15)  Actonel 35 Mg  Tabs (Risedronate Sodium) .... One Tablet By Mouth Weekly  Allergies (verified): No Known Drug  Allergies  Past History:  Past medical, surgical, family and social histories (including risk factors) reviewed, and no changes noted (except as noted below).  Past Medical History: Reviewed history from 04/13/2009 and no changes required. CORONARY HEART DISEASE (ICD-414.00) AMI 2004 with stents ARTHRITIS, RHEUMATOID (ICD-714.0) EMPHYSEMA (ICD-492.8) PULMONARY FIBROSIS (ICD-515)    -TLC 69%  DLCO 68% FeV1 97%  2009    -TLC 74%  DLCO 66% FeV1 83% 2010    -CT Chest :UIP and paraseptal centrilobular emphysema 2009    -CT Chest :  03/2008 UIP and paraseptal centrilobular emphysema sl worse compared to 2009    - walk: 1700 ft,  no desat HX OFASTHMATIC BRONCHITIS, ACUTE (ICD-466.0) COPD (ICD-496)  Past Surgical History: Reviewed history from 09/10/2007 and no changes required. stents with AMI 2004 Left foot surgery  Vericose vein surgery  Past Pulmonary History:  Pulmonary History: EMPHYSEMA (ICD-492.8) PULMONARY FIBROSIS (ICD-515)    -TLC 69%  DLCO 68% FeV1 97%  2009    -TLC 74%  DLCO 66% FeV1 83% 2010    -CT Chest :UIP and paraseptal centrilobular emphysema 2009    -CT Chest :  03/2008 UIP and paraseptal centrilobular emphysema sl worse compared to 2009    - walk: 1700 ft,  no desat HX OFASTHMATIC BRONCHITIS, ACUTE (ICD-466.0) COPD (ICD-496)  Family History: Reviewed history from 11/18/2007 and no changes required. sister breast ca colon ca father in his 51's AMI mother Pulmonary Fibrosis-brother passed away 10/03/2022  Social History: Reviewed history from 11/18/2007 and no changes required. Patient states former smoker. Quit approx 39yrs ago. Smoked x 30 yrs upto 3ppd. Pt is married. Pt is retired Medical illustrator. Alcohol Use - yes Daily Caffeine Use Patient is a former smoker.   Review of Systems       The patient complains of shortness of breath with activity, productive cough, and non-productive cough.  The patient denies shortness of breath at rest, coughing up  blood, chest pain, irregular heartbeats, acid heartburn, indigestion, loss of appetite, weight change, abdominal pain, difficulty swallowing, sore throat, tooth/dental problems, headaches, nasal congestion/difficulty breathing through nose, sneezing, itching, ear ache, anxiety, depression, hand/feet swelling, joint stiffness or pain, rash, change in color of mucus, and fever.    Vital Signs:  Patient profile:   77 year old male Height:      73 inches Weight:      185.38 pounds BMI:     24.55 O2 Sat:      93 % on 4 L/minpulsed Temp:     97.4 degrees F oral Pulse rate:   73 / minute BP sitting:   124 / 86  (left arm) Cuff size:   regular  Vitals Entered By: Gweneth Dimitri RN (February 21, 2010 4:00 PM)  O2 Flow:  4 L/minpulsed  Clinical Reports Reviewed:  CXR:  04/13/2009: CXR Results:  Findings: Pulmonary  interstitial fibrosis with progression when compared to 05/07/2007.  Accentuated coarsened interstitial markings primarily in the lower lung zones.  Probable emphysematous changes in the upper lung zones particularly on the left.  Normal cardiac silhouette.  Bony thorax intact.   IMPRESSION: Pulmonary interstitial fibrosis with progression since 05/07/2007 CXR.  PFT's:  04/05/2008: DLCO %Predicted:  66 FEF 25/75 %Predicted:  69 FEV1 %Predicted:  87 FVC %Predicted:  79 Post Spirometry FEF 25/75 %Predicted:  71 Post Spirometry FEV1 %Predicted:  83 Post Spirometry FVC %Predicted:  74 RV %Predicted:  55 TLC %Predicted:  74  07/21/2007: DLCO %Predicted:  68 FEF 25/75 %Predicted:  121 FEV1 %Predicted:  97 FVC %Predicted:  90 Post Spirometry FEF 25/75 %Predicted:  125 Post Spirometry FEV1 %Predicted:  105 Post Spirometry FVC %Predicted:  97 RV %Predicted:  34 TLC %Predicted:  69  CC: 3 month follow up.  Pt states breathing is unchanged from last OV.  SOB with exetion, prod cough with clear to yellow mucus.  Denies wheezing and chest tightness.  Comments Medications  reviewed with patient Daytime contact number verified with patient. Gweneth Dimitri RN  February 21, 2010 4:01 PM    Physical Exam  Additional Exam:  Gen: Pleasant, in no distress , normal affect ENT: no lesions, no post nasal drip Neck: No JVD, no TMG, no carotid bruits Lungs: No use of accessory muscles, no dullness to percussion, dry bibasilar rales no rhonchi now ausculted Cardiovascular: RRR, heart sounds normal, no murmurs or gallops, tr to none  edema on left, none on right, scattered varicosities esp along left lower leg/ankle. venous insufficiency changes. neg homans sign, neg calf pain.  Abdomen: soft and non-tender, no HSM, BS normal, no guarding or rebound.  Musculoskeletal: No deformities, no cyanosis or clubbing Neuro: alert, non-focal   White Cell Count     [L]  4.3 K/uL                    4.5-10.5   Red Cell Count       [L]  2.78 Mil/uL                 4.22-5.81   Hemoglobin           [L]  10.7 g/dL                   44.0-10.2   Hematocrit           [L]  30.4 %                      39.0-52.0   MCV                  [H]  109.3 fl                    78.0-100.0   MCHC                      35.2 g/dL                   72.5-36.6   RDW                  [H]  15.7 %                      11.5-14.6   Platelet Count            200.0 K/uL  150.0-400.0   Neutrophil %              70.7 %                      43.0-77.0   Lymphocyte %              13.1 %                      12.0-46.0   Monocyte %                12.0 %                      3.0-12.0   Eosinophils%              4.1 %                       0.0-5.0   Basophils %               0.1 %                       0.0-3.0   Neutrophill Absolute      3.1 K/uL                    1.4-7.7   Lymphocyte Absolute  [L]  0.6 K/uL                    0.7-4.0   Monocyte Absolute         0.5 K/uL                    0.1-1.0  Eosinophils, Absolute                             0.2 K/uL                    0.0-0.7   Basophils Absolute         0.0 K/uL                    0.0-0.1  Tests: (2) Hepatic/Liver Function Panel (HEPATIC)   Total Bilirubin           0.4 mg/dL                   5.7-8.4   Direct Bilirubin          0.1 mg/dL                   6.9-6.2   Alkaline Phosphatase      101 U/L                     39-117   AST                       19 U/L                      0-37   ALT                       13 U/L                      0-53   Total  Protein             6.6 g/dL                    1.6-1.0   Albumin                   3.8 g/dL                    9.6-0.4   Impression & Recommendations:  Problem # 1:  COPD (ICD-496) Assessment Unchanged  Stable COPD plan No change in inhaled medications.   Maintain treatment program as currently prescribed.  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 Take 1 tab by mouth at bedtime  Complete Medication List: 1)  Toprol Xl 50 Mg Tb24 (Metoprolol succinate) .... Take 1 tablet by mouth once a day 2)  Adult Aspirin Low Strength 81 Mg Tbdp (Aspirin) .... Take 1 tablet by mouth once a day 3)  Avapro 150 Mg Tabs (Irbesartan) .[name] Kitchen.. 1 by mouth daily 4)  Zocor 40 Mg Tabs (Simvastatin) .... Once daily 5)  Anti-oxidant Tabs (Multiple vitamin) .... Once daily 6)  Calcium 600 1500 Mg Tabs (Calcium carbonate) .... Take 1 am and at bedtime 7)  Zetia 10 Mg Tabs (Ezetimibe) .[name] Kitchen.. 1 by mouth daily 8)  Spiriva Handihaler 18 Mcg Caps (Tiotropium bromide monohydrate) .[name] Kitchen.. 1 capsule once daily 9)  Qvar 80 Mcg/act Aers (Beclomethasone dipropionate) .... 2  puffs two times a day 10)  Oxygen  .[name] Kitchen.. 4l rest/exertion 11)  Azathioprine 50 Mg Tabs (Azathioprine) .... Take 4 by mouth daily 12)  Nexium 40 Mg Cpdr (Esomeprazole magnesium)  .[name] Kitchen.. 1 by mouth once daily 13)  Restoril 15 Mg Caps (Temazepam) .... Take 1 tab by mouth at bedtime 14)  Pepcid Ac Maximum Strength 20 Mg Chew (Famotidine) .... One by mouth daily 15)  Actonel 35 Mg Tabs (Risedronate sodium) .... One tablet by mouth weekly  Other Orders: Est. Patient Level III (54098) TLB-CBC Platelet - w/Differential (85025-CBCD) TLB-Hepatic/Liver Function Pnl (80076-HEPATIC)  Patient Instructions: 1)  We will schedule full pulmonary function studies and Chest xray on return in early April 2012 2)  Labs today 3)  No change in medications 4)  Return in       April 2012  Appended Document: Pulmonary OV fax angela Nickola Major, ron polite

## 2010-04-12 LAB — CARDIAC PANEL(CRET KIN+CKTOT+MB+TROPI)
CK, MB: 1.4 ng/mL (ref 0.3–4.0)
CK, MB: 1.6 ng/mL (ref 0.3–4.0)
Relative Index: INVALID (ref 0.0–2.5)
Relative Index: INVALID (ref 0.0–2.5)
Total CK: 53 U/L (ref 7–232)
Troponin I: 0.01 ng/mL (ref 0.00–0.06)
Troponin I: <0.01 ng/mL (ref 0.00–0.06)

## 2010-04-12 LAB — CBC
Hemoglobin: 11 g/dL — ABNORMAL LOW (ref 13.0–17.0)
MCHC: 34 g/dL (ref 30.0–36.0)
Platelets: 223 10*3/uL (ref 150–400)
RBC: 2.63 MIL/uL — ABNORMAL LOW (ref 4.22–5.81)
RBC: 3.04 MIL/uL — ABNORMAL LOW (ref 4.22–5.81)
WBC: 5.1 10*3/uL (ref 4.0–10.5)

## 2010-04-12 LAB — DIFFERENTIAL
Basophils Relative: 0 % (ref 0–1)
Eosinophils Absolute: 0.2 10*3/uL (ref 0.0–0.7)
Lymphocytes Relative: 10 % — ABNORMAL LOW (ref 12–46)
Lymphs Abs: 0.5 10*3/uL — ABNORMAL LOW (ref 0.7–4.0)
Neutrophils Relative %: 75 % (ref 43–77)
Neutrophils Relative %: 82 % — ABNORMAL HIGH (ref 43–77)

## 2010-04-12 LAB — CK TOTAL AND CKMB (NOT AT ARMC)
CK, MB: 1.4 ng/mL (ref 0.3–4.0)
Total CK: 52 U/L (ref 7–232)

## 2010-04-12 LAB — BRAIN NATRIURETIC PEPTIDE: Pro B Natriuretic peptide (BNP): <30 pg/mL (ref 0.0–100.0)

## 2010-04-12 LAB — MAGNESIUM: Magnesium: 2.1 mg/dL (ref 1.5–2.5)

## 2010-04-12 LAB — COMPREHENSIVE METABOLIC PANEL
ALT: 20 U/L (ref 0–53)
Alkaline Phosphatase: 112 U/L (ref 39–117)
CO2: 26 mEq/L (ref 19–32)
GFR calc non Af Amer: 49 mL/min — ABNORMAL LOW (ref >60)
Glucose, Bld: 170 mg/dL — ABNORMAL HIGH (ref 70–99)
Potassium: 4.5 mEq/L (ref 3.5–5.1)
Sodium: 140 mEq/L (ref 135–145)

## 2010-04-12 LAB — BASIC METABOLIC PANEL
BUN: 21 mg/dL (ref 6–23)
CO2: 28 mEq/L (ref 19–32)
Chloride: 110 mEq/L (ref 96–112)
GFR calc Af Amer: >60 mL/min (ref >60)
Potassium: 4.5 mEq/L (ref 3.5–5.1)

## 2010-04-12 LAB — POCT CARDIAC MARKERS
CKMB, poc: 1.2 ng/mL (ref 1.0–8.0)
Troponin i, poc: <0.05 ng/mL (ref 0.00–0.09)

## 2010-04-12 LAB — LIPASE, BLOOD: Lipase: 58 U/L (ref 11–59)

## 2010-04-18 ENCOUNTER — Other Ambulatory Visit: Payer: Self-pay | Admitting: Internal Medicine

## 2010-04-21 ENCOUNTER — Other Ambulatory Visit: Payer: Self-pay | Admitting: Internal Medicine

## 2010-04-21 ENCOUNTER — Ambulatory Visit
Admission: RE | Admit: 2010-04-21 | Discharge: 2010-04-21 | Disposition: A | Discharge status: Home / Self Care | Payer: Medicare Other | Source: Ambulatory Visit | Attending: Internal Medicine | Admitting: Internal Medicine

## 2010-04-21 DIAGNOSIS — I724 Aneurysm of artery of lower extremity: Secondary | ICD-10-CM

## 2010-04-21 MED ORDER — IOHEXOL 300 MG/ML  SOLN
100.0000 mL | Freq: Once | INTRAMUSCULAR | Status: AC | PRN
  Administered 2010-04-21: 100 mL via INTRAVENOUS

## 2010-04-25 ENCOUNTER — Other Ambulatory Visit: Payer: Self-pay | Admitting: Surgery

## 2010-04-25 DIAGNOSIS — I724 Aneurysm of artery of lower extremity: Secondary | ICD-10-CM

## 2010-04-28 ENCOUNTER — Other Ambulatory Visit: Payer: Medicare Other

## 2010-05-08 ENCOUNTER — Ambulatory Visit
Admission: RE | Admit: 2010-05-08 | Discharge: 2010-05-08 | Disposition: A | Discharge status: Home / Self Care | Payer: Medicare Other | Source: Ambulatory Visit | Attending: Surgery | Admitting: Surgery

## 2010-05-08 ENCOUNTER — Encounter (INDEPENDENT_AMBULATORY_CARE_PROVIDER_SITE_OTHER): Payer: Medicare Other | Admitting: Surgery

## 2010-05-08 DIAGNOSIS — I723 Aneurysm of iliac artery: Secondary | ICD-10-CM

## 2010-05-08 DIAGNOSIS — I724 Aneurysm of artery of lower extremity: Secondary | ICD-10-CM

## 2010-05-08 MED ORDER — IOHEXOL 350 MG/ML SOLN
150.0000 mL | Freq: Once | INTRAVENOUS | Status: AC | PRN
  Administered 2010-05-08: 150 mL via INTRAVENOUS

## 2010-05-09 NOTE — Assessment & Plan Note (Signed)
OFFICE VISIT  HAGOP, MCCOLLAM DOB:  1931-05-11                                       05/08/2010 CHART#:[id number]  REASON FOR VISIT:  Iliac aneurysm.  REFERRING PHYSICIAN:  Deirdre Peer. Polite, M.D.  PULMONARY PHYSICIAN:  Charlcie Cradle. Delford Field, MD, FCCP  HISTORY:  This is a very pleasant 77 year old gentleman with advanced pulmonary fibrosis, who obtained a CT scan for chronic constipation. This detected a large iliac aneurysm.  He has been asymptomatic.  I sent him for a CT angiogram for a dedicated study to further evaluate this. He comes in today to discuss these findings.  The patient suffers from hypertension which is medically managed.  He had a heart attack in 2009.  A stent was attempted but was unsuccessful. He also suffers from pulmonary fibrosis.  He has been followed by Dr. Delford Field.  He is on 4 L of home oxygen.  The patient denies having any abdominal or low-back pain.  REVIEW OF SYSTEMS:  CARDIAC:  Positive for short of breath on exertion. GI:  Positive for constipation. PULMONARY:  Positive for 4 L home oxygen. GU:  Positive for difficult urination. ENT:  Positive for recent change in eyesight.  All other review of systems are negative.  PAST MEDICAL HISTORY:  Hypertension, history of heart attack, coronary artery disease, pulmonary fibrosis, rheumatoid arthritis, depression.  PAST SURGICAL HISTORY:  Varicose vein surgery, hammertoe repair, lung drainage from a pneumonia.  SOCIAL HISTORY:  He is married with 2 children.  He is retired.  Does not smoke; quit 20 years ago.  Does not drink.  FAMILY HISTORY:  Noncontributory.  PHYSICAL EXAMINATION:  Heart rate 66, blood pressure 149/80, respiratory rate is 20.  General:  He is resting comfortably, in no acute distress. HEENT:  Within normal limits.  Lungs are coarse bilaterally. Cardiovascular:  Regular rate and rhythm.  His abdomen is soft, nontender.  His iliac aneurysm is palpable.   He has palpable pedal pulses and palpable femoral pulses.  Extremities are warm and well perfused.  They are somewhat cyanotic.  Neurologically, he is intact. Skin is without rash.  DIAGNOSTICS:  I have reviewed his CT scan and this reveals a maximal aortic diameter of 3.8.  There is aneurysmal dilatation of the left common iliac artery to 4.8 cm.  The internal iliac is dilated to 2.1 cm. The left common femoral artery is dilated to 2.2 cm.  The right aneurysm is 2.1 cm.  The internal iliac is 1.9.  ASSESSMENT/PLAN:  Abdominal and bilateral internal iliac aneurysms.  PLAN:  I have extensively reviewed the patient's CT scan and discussed the findings with him.  He is obviously very complicated from an endovascular repair standpoint.  However, he is not an operative candidate due to his pulmonary fibrosis.  I have discussed coiling of his internal iliac artery branches in order to stent across his internal takeoff to exclude this aneurysm.  I am unsure at this time whether or not this can be done through a single leg piece or whether or not he is going to need a bifurcated graft.  He is most likely going to need a bifurcated graft which would need to land above the proximal to the right hypogastric artery.  Because of his pulmonary fibrosis, I am optimistic that this can be done under local and MAC anesthesia.  I  have set him up for an angiogram on Tuesday, April 24th, to proceed with embolization of the left hypogastric artery branches.  At that time, I will also need to determine whether or not he can be a candidate for only a left-sided stent to treat his common iliac aneurysm.  I will need to check the landing zone just distal to the bifurcation.  Once that has been done, we will determine how to proceed and then fixing his aneurysms.  He is in agreement with this plan.    Jorge Ny, MD Electronically Signed  VWB/MEDQ  D:  05/08/2010  T:  05/09/2010  Job:  [id number]  cc:    Charlcie Cradle. Delford Field, MD, FCCP Deirdre Peer. Polite, M.D. [facility]

## 2010-05-15 ENCOUNTER — Encounter: Payer: Medicare Other | Admitting: Surgery

## 2010-05-16 ENCOUNTER — Ambulatory Visit (HOSPITAL_COMMUNITY)
Admission: RE | Admit: 2010-05-16 | Discharge: 2010-05-16 | Disposition: A | Discharge status: Home / Self Care | Payer: Medicare Other | Source: Ambulatory Visit | Attending: Surgery | Admitting: Surgery

## 2010-05-16 DIAGNOSIS — I714 Abdominal aortic aneurysm, without rupture, unspecified: Secondary | ICD-10-CM | POA: Insufficient documentation

## 2010-05-16 DIAGNOSIS — I723 Aneurysm of iliac artery: Secondary | ICD-10-CM | POA: Insufficient documentation

## 2010-05-16 DIAGNOSIS — Z01810 Encounter for preprocedural cardiovascular examination: Secondary | ICD-10-CM | POA: Insufficient documentation

## 2010-05-16 LAB — POCT I-STAT, CHEM 8
HCT: 34 % — ABNORMAL LOW (ref 39.0–52.0)
Hemoglobin: 11.6 g/dL — ABNORMAL LOW (ref 13.0–17.0)
Sodium: 145 mEq/L (ref 135–145)
TCO2: 26 mmol/L (ref 0–100)

## 2010-05-24 ENCOUNTER — Ambulatory Visit (HOSPITAL_COMMUNITY)
Admission: RE | Admit: 2010-05-24 | Discharge: 2010-05-24 | Disposition: A | Discharge status: Home / Self Care | Payer: Medicare Other | Source: Ambulatory Visit | Attending: Surgery | Admitting: Surgery

## 2010-05-24 ENCOUNTER — Other Ambulatory Visit: Payer: Self-pay | Admitting: Surgery

## 2010-05-24 ENCOUNTER — Encounter (HOSPITAL_COMMUNITY)
Admission: RE | Admit: 2010-05-24 | Discharge: 2010-05-24 | Disposition: A | Discharge status: Home / Self Care | Payer: Medicare Other | Source: Ambulatory Visit | Attending: Surgery | Admitting: Surgery

## 2010-05-24 DIAGNOSIS — I714 Abdominal aortic aneurysm, without rupture, unspecified: Secondary | ICD-10-CM | POA: Insufficient documentation

## 2010-05-24 DIAGNOSIS — Z01818 Encounter for other preprocedural examination: Secondary | ICD-10-CM | POA: Insufficient documentation

## 2010-05-24 DIAGNOSIS — Z01812 Encounter for preprocedural laboratory examination: Secondary | ICD-10-CM | POA: Insufficient documentation

## 2010-05-24 DIAGNOSIS — J8410 Pulmonary fibrosis, unspecified: Secondary | ICD-10-CM | POA: Insufficient documentation

## 2010-05-24 DIAGNOSIS — R059 Cough, unspecified: Secondary | ICD-10-CM | POA: Insufficient documentation

## 2010-05-24 DIAGNOSIS — Z01810 Encounter for preprocedural cardiovascular examination: Secondary | ICD-10-CM | POA: Insufficient documentation

## 2010-05-24 DIAGNOSIS — R05 Cough: Secondary | ICD-10-CM | POA: Insufficient documentation

## 2010-05-24 DIAGNOSIS — I10 Essential (primary) hypertension: Secondary | ICD-10-CM | POA: Insufficient documentation

## 2010-05-24 LAB — PROTIME-INR: Prothrombin Time: 14.6 seconds (ref 11.6–15.2)

## 2010-05-24 LAB — BLOOD GAS, ARTERIAL
Acid-Base Excess: 0.7 mmol/L (ref 0.0–2.0)
Drawn by: 206361
O2 Content: 4 L/min
pCO2 arterial: 37.6 mmHg (ref 35.0–45.0)

## 2010-05-24 LAB — SURGICAL PCR SCREEN: Staphylococcus aureus: NEGATIVE

## 2010-05-24 LAB — CBC
MCH: 37 pg — ABNORMAL HIGH (ref 26.0–34.0)
Platelets: 210 10*3/uL (ref 150–400)
RBC: 2.7 MIL/uL — ABNORMAL LOW (ref 4.22–5.81)
WBC: 4.4 10*3/uL (ref 4.0–10.5)

## 2010-05-24 LAB — COMPREHENSIVE METABOLIC PANEL
ALT: 10 U/L (ref 0–53)
Alkaline Phosphatase: 129 U/L — ABNORMAL HIGH (ref 39–117)
CO2: 25 mEq/L (ref 19–32)
Calcium: 9.4 mg/dL (ref 8.4–10.5)
GFR calc non Af Amer: 54 mL/min — ABNORMAL LOW (ref >60)
Glucose, Bld: 121 mg/dL — ABNORMAL HIGH (ref 70–99)
Potassium: 4.9 mEq/L (ref 3.5–5.1)
Sodium: 138 mEq/L (ref 135–145)

## 2010-05-29 ENCOUNTER — Other Ambulatory Visit: Payer: Self-pay | Admitting: Critical Care Medicine

## 2010-06-02 ENCOUNTER — Inpatient Hospital Stay (HOSPITAL_COMMUNITY): Payer: Medicare Other

## 2010-06-02 ENCOUNTER — Inpatient Hospital Stay (HOSPITAL_COMMUNITY)
Admission: RE | Admit: 2010-06-02 | Discharge: 2010-06-03 | DRG: 254 | Disposition: A | Discharge status: Home / Self Care | Payer: Medicare Other | Source: Ambulatory Visit | Attending: Surgery | Admitting: Surgery

## 2010-06-02 DIAGNOSIS — Z7982 Long term (current) use of aspirin: Secondary | ICD-10-CM

## 2010-06-02 DIAGNOSIS — Z79899 Other long term (current) drug therapy: Secondary | ICD-10-CM

## 2010-06-02 DIAGNOSIS — I723 Aneurysm of iliac artery: Secondary | ICD-10-CM | POA: Diagnosis present

## 2010-06-02 DIAGNOSIS — I252 Old myocardial infarction: Secondary | ICD-10-CM

## 2010-06-02 DIAGNOSIS — Z9861 Coronary angioplasty status: Secondary | ICD-10-CM

## 2010-06-02 DIAGNOSIS — I10 Essential (primary) hypertension: Secondary | ICD-10-CM | POA: Diagnosis present

## 2010-06-02 DIAGNOSIS — I714 Abdominal aortic aneurysm, without rupture, unspecified: Principal | ICD-10-CM | POA: Diagnosis present

## 2010-06-02 DIAGNOSIS — Z01812 Encounter for preprocedural laboratory examination: Secondary | ICD-10-CM

## 2010-06-02 DIAGNOSIS — I2510 Atherosclerotic heart disease of native coronary artery without angina pectoris: Secondary | ICD-10-CM | POA: Diagnosis present

## 2010-06-02 DIAGNOSIS — Z01810 Encounter for preprocedural cardiovascular examination: Secondary | ICD-10-CM

## 2010-06-02 DIAGNOSIS — Z87891 Personal history of nicotine dependence: Secondary | ICD-10-CM

## 2010-06-02 LAB — TYPE AND SCREEN: ABO/RH(D): A NEG

## 2010-06-02 LAB — GLUCOSE, CAPILLARY
Glucose-Capillary: 96 mg/dL (ref 70–99)
Glucose-Capillary: 124 mg/dL — ABNORMAL HIGH (ref 70–99)

## 2010-06-02 LAB — PROTIME-INR
INR: 1.23 (ref 0.00–1.49)
Prothrombin Time: 15.7 s — ABNORMAL HIGH (ref 11.6–15.2)

## 2010-06-02 LAB — APTT: aPTT: 27 s (ref 24–37)

## 2010-06-02 LAB — CBC
Platelets: 115 10*3/uL — ABNORMAL LOW (ref 150–400)
RBC: 2.11 MIL/uL — ABNORMAL LOW (ref 4.22–5.81)
WBC: 3.7 10*3/uL — ABNORMAL LOW (ref 4.0–10.5)

## 2010-06-02 LAB — BASIC METABOLIC PANEL
Calcium: 8.6 mg/dL (ref 8.4–10.5)
Creatinine, Ser: 1.01 mg/dL (ref 0.4–1.5)
GFR calc Af Amer: >60 mL/min (ref >60)

## 2010-06-02 LAB — MAGNESIUM: Magnesium: 2 mg/dL (ref 1.5–2.5)

## 2010-06-03 LAB — BASIC METABOLIC PANEL
BUN: 21 mg/dL (ref 6–23)
Creatinine, Ser: 1.14 mg/dL (ref 0.4–1.5)
GFR calc non Af Amer: >60 mL/min (ref >60)
Glucose, Bld: 92 mg/dL (ref 70–99)
Potassium: 4.1 mEq/L (ref 3.5–5.1)

## 2010-06-03 LAB — CBC
Hemoglobin: 7.5 g/dL — ABNORMAL LOW (ref 13.0–17.0)
MCH: 37.1 pg — ABNORMAL HIGH (ref 26.0–34.0)
MCV: 106.9 fL — ABNORMAL HIGH (ref 78.0–100.0)
Platelets: 104 10*3/uL — ABNORMAL LOW (ref 150–400)
RBC: 2.02 MIL/uL — ABNORMAL LOW (ref 4.22–5.81)
RDW: 14.2 % (ref 11.5–15.5)
WBC: 3.8 10*3/uL — ABNORMAL LOW (ref 4.0–10.5)

## 2010-06-03 LAB — GLUCOSE, CAPILLARY

## 2010-06-05 NOTE — Op Note (Signed)
NAMECOLE, EASTRIDGE              ACCOUNT NO.:  [id number]  MEDICAL RECORD NO.:  [id number]           PATIENT TYPE:  I  LOCATION:  2301                         FACILITY:  [facility]  PHYSICIAN:  Juleen China IV, MDDATE OF BIRTH:  [birth year]  DATE OF PROCEDURE:  06/02/2010 DATE OF DISCHARGE:                              OPERATIVE REPORT   PREOPERATIVE DIAGNOSIS:  Abdominal and iliac aneurysms.  POSTOPERATIVE DIAGNOSIS:  Abdominal and iliac aneurysms.  PROCEDURES PERFORMED: 1. Endovascular repair of abdominal iliac aneurysm. 2. Bilateral ultrasound-guided percutaneous access. 3. Distal extension x2. 4. Catheter in aorta x2 5. Abdominal aortogram.  ANESTHESIA:  MAC.  SURGEON: 1. Charlena Cross, MD  ASSISTANT:  Gaynell Face.  COMPLICATIONS:  None.  FINDINGS:  Complete exclusion.  INDICATIONS:  Mr. Rossa is a 77 year old gentleman with abdominal and iliac aneurysms.  He has undergone left hypogastric branch ligation previously in anticipation of his repair.  He comes in today for his procedure.  We discussed not addressing his femoral aneurysms at this time in order to be able to perform this procedure percutaneously with his underlying pulmonary fibrosis, we would like to avoid general anesthesia.  PROCEDURE:  The patient was identified in the holding area and taken to room #8, placed supine on the table.  MAC anesthesia was administered. The patient was prepped and draped in a usual fashion.  A time-out was called.  Antibiotics were given.  Ultrasound was used to evaluate both femoral arteries.  There were mild ectasia of both with posterior calcification.  Ultrasound images were obtained.  An #11 blade was used to make a skin nick.  Both common femoral arteries were then accessed under ultrasound guidance with an 18 gauge needle.  A 0.035 wire was advanced into the aorta under fluoroscopic visualization.  ProGlides were placed for preclosure in the 11 o'clock and 1  o'clock position. Next, 8-French sheaths were placed bilaterally.  On the right side, a pigtail catheter was advanced to the level of L1.  The right-sided wire exchange was performed.  I used a Lunderquist wire and then placed an 18- French sheath into the aorta.  The main body was prepared on the back table and advanced to the 18-French sheath on the left.  This was a Biomedical scientist 23 x 12 x 18.  An aortogram was performed to locate the renal arteries.  The main body was then deployed down the contralateral gate. Contralateral gate was then cannulated using an Omni flush catheter and a Glidewire.  I did have to re-constrain the gate in order to remove the gate and in order to cannulate it.  Once the gate was cannulated, I was able to freely rotate the Omni flush catheter with the device confirming successful cannulation.  I then performed a repeat aortogram to evaluate the location of the main body and ended up re-constraining and moving it slightly more inferiorly so that it then landed at the lower edge of the left renal artery which was the lowest renal artery.  I then deployed the remaining portion of the ipsilateral device so that it could be removed.  The device was  removed under fluoroscopic visualization. Attention was then turned towards the right groin.  The image intensifier was rotated to the left anterior oblique position.  A retrograde injection was performed through the sheath locating the right hypogastric artery.  A Lunderquist wire was then advanced through the Omni flush catheter which was removed.  A 16-French sheath was then advanced into the contralateral gate.  Due to the length of coverage, I placed an extension first.  This was a 14 x 7 piece.  This was followed with the distal extension which was a 20 x 13.5.  This landed right at the level of the right hypogastric artery.  Next, the attention was turned towards the left groin.  The image intensifier was rotated  to a right anterior oblique.  A retrograde sheath injection was performed to evaluate the landing zone of the left limb.  I selected a 12 x 7 extension and deployed this into the left external iliac giving approximately 4 cm coverage over the origin of the left hypogastric artery.  Next, a Q50 balloon was used to mold the entire graft.  A completion study was performed which showed continued patency of both renal arteries as well as the right hypogastric artery.  There was no evidence of endoleak.  At this point, catheters were removed.  Wires were exchanged out for Bentson wires.  The sheaths were then removed cinching down the previously placed ProGlide devices for arterial closure.  Both groins were hemostatic.  I did reverse the patient's heparin which had been given once the sheath access had been acquired. The patient was systemically heparinized for the procedure and it was reversed with 50 mg of protamine.  At the end of the procedure, both groins were hemostatic.  The skin was closed with 4-0 Vicryl.  Dermabond was placed in the wound.  The patient had palpable pulses at the end of the case.     Jorge Ny, MD     VWB/MEDQ  D:  06/02/2010  T:  06/03/2010  Job:  [id number]  Electronically Signed by Arelia Longest IV MD on 06/05/2010 10:58:32 PM

## 2010-06-05 NOTE — Op Note (Signed)
Evan [name], Evan [name]              ACCOUNT NO.:  [id number]  MEDICAL RECORD NO.:  [id number]           PATIENT TYPE:  O  LOCATION:  SDSC                         FACILITY:  [facility]  PHYSICIAN:  Juleen China IV, MDDATE OF BIRTH:  [birth year]  DATE OF PROCEDURE:  05/16/2010 DATE OF DISCHARGE:  05/16/2010                              OPERATIVE REPORT   PREOPERATIVE DIAGNOSIS:  Abdominal aortic and iliac aneurysm.  POSTOPERATIVE DIAGNOSIS:  Abdominal aortic and iliac aneurysm.  PROCEDURE PERFORMED: 1. Ultrasound access, right femoral artery. 2. Abdominal aortogram. 3. Pelvic angiogram. 4. Third-order catheterization x2 (anterior and posterior left     internal iliac branches). 5. Coil embolization of the anterior branch of the internal iliac     artery on the left. 6. Coil embolization of the posterior internal iliac branch of the     left.  DEVICES USED: Main Body left GORE 23x12x18, 12x7, Contra right 14x7, 20x13.5  INDICATIONS:  Evan [name] is a 77 year old gentleman who presented to the office with a CT scan that showed large left common iliac aneurysm as well as right iliac aneurysm and internal iliac aneurysms in the right. He also has a small abdominal aneurysm.  He comes in today for coil of iliac vessels for future endovascular repair."  PROCEDURE:  The patient was identified in the holding area and taken to room 8, placed supine on the table.  After prepping and draping in usual fashion, time-out was called.  The right femoral artery was evaluated with ultrasound.  It was aneurysmal.  1% lidocaine was used for local anesthesia.  The right femoral artery was accessed under ultrasound guidance with an 18-gauge needle and an 0.35 wire was advanced into the aorta under fluoroscopic visualization.  A 5-French sheath was placed. Next, over the wire, an Omniflush catheter was advanced to the level of L1 and abdominal aortogram was obtained.  Catheter was then pulled  down to the aortic bifurcation and pelvic angiogram was performed.  Next, using the Omniflush catheter and Bentson wire, aortic bifurcation was crossed.  Catheter was advanced into the distal common iliac artery and iliac angiogram was performed.  A 5-French sheath was then advanced over the wire and placed in the distal common iliac artery on the left.  I then used catheter and a Bentson wire to navigate into the internal iliac artery and then out into the posterior branch.  Contrast injection was performed with the catheter in the posterior internal iliac branch confirming its cannulation.  Sizing measurements were performed.  Coil embolization was then performed.  I used 6-mm Nestor coils.  I performed an arteriogram and felt that this was not adequately treated, however, I then placed a 10-mm Nestor coil in the proximal portion of the branch. Another injection was performed and I was satisfied with the treatment of this vessel.  I then withdrew the Comfy catheter and then cannulated the anterior internal iliac branch.  A contrast injection was performed with the catheter at this level and confirming successful cannulation. I then performed coil embolization deploying two 6-mm Nestor coils. Catheter was then withdrawn slightly and a 10-mm [employer]  coil was placed. A sheath injection was performed in the distal common iliac artery and I felt that one additional coil will be required.  A 10-mm Nestor coil was placed beginning at the anterior branch at the level of bifurcation. Final injection was performed and I was satisfied with successful embolization.  The sheath was pulled back to the right external iliac artery and the patient was taken to holding area for sheath extraction.  Excellent coil fixation of the left anterior, posterior, internal.  Dictation Ends Here.     Jorge Ny, MD     VWB/MEDQ  D:  05/16/2010  T:  05/17/2010  Job:  [id number]  Electronically Signed  by Arelia Longest IV MD on 06/05/2010 10:52:11 PM

## 2010-06-06 NOTE — H&P (Signed)
NAMEPREM, COYKENDALL NO.:  [id number]   MEDICAL RECORD NO.:  [id number]          PATIENT TYPE:  INP   LOCATION:  1833                         FACILITY:  [facility]   PHYSICIAN:  Lucita Ferrara, MD         DATE OF BIRTH:  October 03, 1931   DATE OF ADMISSION:  12/17/2007  DATE OF DISCHARGE:                              HISTORY & PHYSICAL   PRIMARY CARE PHYSICIAN:  Paul Half, M.D., [facility].   PULMONOLOGIST:  Charlcie Cradle. Delford Field, MD, FCCP   CARDIOLOGIST:  Francisca December, M.D.   HISTORY OF PRESENT ILLNESS:  The patient is a 77 year old who presented  to [facility] with intractable dizziness.  The dizziness  is upon ambulation.  The patient denies focal neurological deficits,  numbness, tingling.  He denies a change in his medications.  He denies  any fevers or chills.  Denies ear aches.  He has chronic tinnitus and  denies ever being seen by a Ear, Nose, Throat surgery physician.  He  denies sore throat.  Denies fevers or chills, sick contacts.  He was  evaluated in the emergency room today and was unable to benefit from  emergency room treatment.  The patient continued to be dizzy.  He was  not found to be hypotensive.   PAST MEDICAL HISTORY:  1. Coronary artery disease.  2. Chronic obstructive pulmonary disease, currently not on home      oxygen.  3. Hypertension.  4. Hyperlipidemia.  5. Myocardial infarction.  6. Remote history of tobacco abuse.  7. Rheumatoid arthritis.   FAMILY HISTORY:  Noncontributory.   SURGICAL HISTORY:  He has had several orthopedic foot surgeries.   ALLERGIES:  NO KNOWN DRUG ALLERGIES.   MEDICATIONS:  1. Ambien 10 mg q.h.s. as needed,  2. Toprol XL 50 mg p.o. once daily.  3. Zetia 10 mg daily.  4. Elavil 50 mg three times a day.  5. Prednisone 10 mg specialized dosing.  6. Mevacor 40 mg at bedtime.  7. Avapro 150 mg once daily.   REVIEW OF SYSTEMS:  12 point otherwise negative.  Again the patient  denies  any focal neurological deficits.  No nausea or vomiting,  hematemesis, hematochezia, no changes in bowel  or bladder habits.  No  urinary frequency, urgency or burning.  No headaches, no blurred vision,  positive feeling like the room is spinning around me.   PHYSICAL EXAMINATION:  GENERAL:  The patient is no acute distress.  VITALS:  Blood pressures of 181/88, pulse 80, respirations 24,  temperature 96.6, pulse ox 100% on room air.  HEENT: Normocephalic, atraumatic.  Sclerae is anicteric.  NECK:  Supple.  No JVD.  No carotid bruits.  CARDIOVASCULAR:  S1-S2, regular  rate and hythm.  No murmurs, rubs,  clicks.  LUNGS:  Clear to auscultation bilaterally, no rhonchi, rales or wheezes.  ABDOMEN:  Soft, nontender, nondistended.  Positive bowel sounds.  NEUROLOGIC:  Patient is  alert and oriented x3.  Cranial nerves II-XII  are grossly intact.  No nystagmus noted.  EXTREMITIES:  Strength upper and lower  extremities bilaterally within  normal limits 5/5.  Reflexes 2+ bilaterally upper and lower extremities.  Finger-to-nose intact.   EMERGENCY DEPARTMENT COURSE AND LABORATORY RESULTS:  Urinalysis  negative.  Lipase negative.  Complete metabolic panel shows mildly  elevated glucose of 221, BUN 20, creatinine 1.22, otherwise negative.  Cardiac markers negative.  CBC shows mild leukocytosis of 11.6.  Otherwise normal.   RADIOLOGICAL DATA:  CT scan of the head without contrast shows no acute  intracranial findings.  Interval resolution of the right maxillary sinus  opacification.  The patient had a CT scan of the head dated February 13, 2003 which showed opacification of the right maxillary sinus.   ASSESSMENT/PLAN:  77 year old with;  1. Dizziness with mild nausea.  No abdominal pain.  No focal      neurological deficits.  No ear fullness, chronic tinnitus.  2. Coronary artery disease.  No chest pain, shortness of breath.  3. Chronic obstructive pulmonary disease, not on home oxygen.  4.  Rheumatoid arthritis on chronic prednisone therapy.  5. Hypertension with moderate control.  6. History of chronic tinnitus.   DISCUSSION AND PLAN:  We will go ahead and admit the patient to the  medical telemetry unit.  We will monitor neurological status with  frequent neurological checks.  We will check a TSH.  We will get MRI/MRA  of the brain, circle of Willis, rule out posterior circulation  insufficiency or occlusion.  Rule out brain mass or acoustic neuroma  unlikely.  For patient's coronary artery disease, the patient has no  chest pain or shortness of breath.  We will continue risk factor  modification.  We will continue with Toprol XL with holding parameters,  continue Zetia and continue with Avapro for his chronic obstructive  pulmonary disease.  He is not hypoxic at this time.  We will give  albuterol and Atrovent on a p.r.n. basis.  We will continue his  prednisone.  I don't think he needs any stress doses at this time.  A  diagnosis of benign positional vertigo with a diagnosis of exclusion if  the above ruled out.  The patient may  benefit from vestibular exercises  and physical therapy.  The rest of the plans will depend on his  progress.      Lucita Ferrara, MD  Electronically Signed     RR/MEDQ  D:  12/17/2007  T:  12/17/2007  Job:  [id number]

## 2010-06-06 NOTE — Discharge Summary (Signed)
NAMEFERNANDEZ, Evan NO.:  [id number]   MEDICAL RECORD NO.:  [id number]          PATIENT TYPE:  INP   LOCATION:  3713                         FACILITY:  [facility]   PHYSICIAN:  Peggye Pitt, M.D. DATE OF BIRTH:  01-26-1931   DATE OF ADMISSION:  12/17/2007  DATE OF DISCHARGE:  12/19/2007                               DISCHARGE SUMMARY   DISCHARGE DIAGNOSES:  1. Dizziness, likely benign paroxysmal vertigo.  2. Coronary artery disease.  3. Chronic obstructive pulmonary disease.  4. Hypertension.  5. Hyperlipidemia.  6. Rheumatoid arthritis.   DISCHARGE MEDICATIONS:  1. Ambien 2 mg p.o. nightly.  2. Toprol-XL 50 mg p.o. daily.  3. Zetia 2 mg p.o. daily.  4. Elavil 50 mg t.i.d.  5. Prednisone 2 mg p.o. daily.  6. Avapro 150 mg p.o. daily.  7. NitroCor 40 mg p.o. nightly.   DISPOSITION AND FOLLOWUP:  The patient is discharged to home in stable  condition.  His dizziness has resolved.  He has been advised to follow  up with his primary care physician Dr. Hyacinth Meeker for assessment of his  chronic medical issues.   CONSULTATIONS IN THIS HOSPITALIZATION:  None.   IMAGES AND PROCEDURES PERFORMED IN THIS HOSPITALIZATION:  CT head  without contrast on December 17, 2007, that showed no acute intracranial  findings.  He also had an MRI of the brain on December 18, 2007, that  showed no acute intracranial abnormalities with tortuous internal  carotid arteries and vertebral basilar arteries.   HISTORY AND PHYSICAL:  For full details please refer to history and  physical exam dictated by Dr. Flonnie Overman on December 17, 2007, but in brief,  Mr. Rosselli is a pleasant 77 year old Caucasian man who came into the  emergency department with intractable dizziness upon ambulation, had no  focal neurologic deficits.  No recent changes to his medications.  No  fevers or chills.  No ear pain.  For this reason we are called for  evaluation and admission.   HOSPITAL COURSE:  1.  Dizziness.  CT and MRI of his brain were performed just to make      sure he do not have a posterior circulation CVA.  Physical therapy      worked with him on vestibular exercises.  He has had no further      complaints of dizziness.  Given the dizziness is only present with      certain head positions, we believe that the dizziness is likely      secondary to benign paroxysmal vertigo.  No medications indicated      at this time.  2. Rest of his chronic medical issues were stable throughout this      hospitalization and no changes were made to his medications.   PHYSICAL EXAMINATION:  VITAL SIGNS:  On the day of discharge, blood  pressure 124/79, heart rate 62, respirations 18, O2 sats 92% on room air  with a temp of 97.   LABORATORY DATA:  On the day of discharge, sodium 143, potassium 3.9,  chloride 111, bicarb 25, BUN 16, creatinine 1.29 with a glucose  of 88.  WBC 8.4, hemoglobin 13.2 with a platelet count of 166.      Peggye Pitt, M.D.  Electronically Signed     EH/MEDQ  D:  12/19/2007  T:  12/19/2007  Job:  [id number]   cc:   Bertram Millard. Hyacinth Meeker, M.D.

## 2010-06-06 NOTE — Assessment & Plan Note (Signed)
[facility]                             PULMONARY OFFICE NOTE   Evan [name], Evan [name]                     MRN:          [id number]  DATE:12/26/2006                            DOB:          07-Sep-1931    Evan [name] is a 77 year old white male history of chronic obstructive  airways disease exacerbated by chronic rheumatoid arthritis, associated  pulmonary fibrosis, and smoking-related obstructive airways disease.  He  has been actually doing fairly well but not been very consistent with  his Qvar and Spiriva usage.  He is supposed to be on Qvar 40 mcg  strength 2 sprays daily and Spiriva daily.  He has not been using these  medications regularly.  He states his breathing may be somewhat worse  since this has occurred.   EXAM:  Temp 98, blood pressure 120/74, pulse 81, saturation 92% room  air.  CHEST:  Diminished breath sounds with prolonged expiratory phase; no  wheeze or rhonchi noted.  CARDIAC:  Regular rate and rhythm without S3, normal S1 and S2.  ABDOMEN:  Soft, nontender.  EXTREMITIES:  No edema or clubbing.  SKIN:  Clear.   IMPRESSION:  1. Chronic obstructive lung disease with pulmonary fibrosis.  2. Rheumatoid arthritis.   PLAN:  Resume Spiriva 1 capsule daily in the [facility] and Qvar 40 mcg  take 2 sprays b.i.d. x1 week then reduce to 2 sprays daily thereafter.  Refills were given.  The patient was reinstructed as to its proper use.  We will see the patient back in followup.     Evan Cradle Delford Field, MD, [facility]  Electronically Signed    PEW/MedQ  DD: 12/27/2006  DT: 12/27/2006  Job #: [id number]

## 2010-06-09 NOTE — Discharge Summary (Signed)
NAME:  Evan [name], Evan [name]                        ACCOUNT NO.:  [id number]   MEDICAL RECORD NO.:  [id number]                   PATIENT TYPE:  INP   LOCATION:  3731                                 FACILITY:  [facility]   PHYSICIAN:  Ricki Rodriguez, M.D.               DATE OF BIRTH:  1931-02-28   DATE OF ADMISSION:  07/24/2001  DATE OF DISCHARGE:  07/29/2001                                 DISCHARGE SUMMARY   DISCHARGE DIAGNOSES:  1. Acute inferior posterior myocardial infarction.  2. Rheumatoid arthritis.  3. Anemia.  4. Hypertension.  5. Coronary artery disease.  6. Status post percutaneous transluminal coronary angioplasty stent to right     coronary artery.   DISCHARGE MEDICATIONS:  1. Enteric coated aspirin 325 mg 1 daily.  2. Plavix 75 mg 1 daily.  3. Protonix 40 mg 1 daily.  4. Potassium chloride 10 mEq 1 daily.  5. Nitroglycerin patch 0.2 mg per hour 1 daily.  6. Keflex 500 mg 3 times daily x 5 days.  7. Ferrous sulfate 325 mg twice daily.  8. Elavil 150 mg 1 at bedtime.  9. Folic acid 1 mg 1 daily.  10.      Calcium carbonate 1000 mg daily.  11.      Methotrexate 12.5 mg every Wednesday.  12.      Toprol XL 25 mg 2 daily.  13.      Diovan 180 mg 1 daily.  14.      Diltiazem 10 mg daily.  15.      Colace 100 mg 1 before bedtime.   DISCHARGE ACTIVITY:  As tolerated.   DISCHARGE DIET:  Low fat, low salt diet.   WOUND CARE INSTRUCTIONS:  Patient to notify doctor if pain, swelling or  discharge.   FOLLOWUP:  By Dr. Algie Coffer in 1-2 weeks.  Patient to calla [phone] for  appointment.   CONDITION ON DISCHARGE:  Improved.   HISTORY:  This 77 year old white male presented with substernal chest pain  of 1-2 hours duration with EKG changes of acute inferolateral wall MI.  The  patient was also chest pain free with nitroglycerin use.  Also received  aspirin, Plavix, Lopressor and heparin in the emergency room.  Has risk  factors of hypertension.   PHYSICAL EXAMINATION:   VITAL SIGNS:  Pulse 65, respiration 16, blood  pressure 131/80, height 6'1, weight 190 pounds.  HEENT:  Patient has hazel eyes.  Mucous membranes pink and moist.  NECK:  No JVD.  LUNGS:  Clear bilaterally.  HEART:  Normal S1, S2.  No murmur, gallop or rub.  EXTREMITIES:  No edema, cyanosis or clubbing.   LABORATORY DATA:  Hemoglobin 13.2, hematocrit 39.2, normal WBC count and  platelet count.  Normal electrolytes, BUN and creatinine.  Sugar elevated at  147.  Normal liver enzymes.  Initial CK MB normal.  Troponin-I elevated at  0.17.  Subsequent CK was  199 with MB of 41 and troponin-I was 1.45.  CK MB  peaked at 133 before returning to 11.7 on 07/27/2001 and troponin-I peaked  at 12.84.  Lipid profile showed cholesterol of 139, triglyceride elevated at  233.  HDL was low at 33.  Chest x-ray showed cardiomegaly without any acute  disease.  EKG showed normal sinus rhythm with acute inferior wall MI.   HOSPITAL COURSE:  The patient was admitted to coronary care unit after  emergent PTCA stent placement in right coronary artery which 95% stenosis,  was reduced to 0% using 3.5 mm x 15 mm long CrossSail balloon followed by  4.5 mm x 20 mm express stent.  After some technical difficulty the lesion  was reduced to less than 20% with TIMI-III flow.   The patient's condition remained stable over the next 48 hours except for a  low hemoglobin of 9.5 by 27.9.  He was started on iron supplements and was  discharged home on 07/29/2001 in satisfactory condition with followup by me  in 1-2 weeks.                                                Ricki Rodriguez, M.D.    ASK/MEDQ  D:  09/03/2001  T:  09/08/2001  Job:  [id number]

## 2010-06-09 NOTE — Op Note (Signed)
[name], [name] NO.:  [id number]   MEDICAL RECORD NO.:  [id number]          PATIENT TYPE:  OIB   LOCATION:  NA                           FACILITY:  [facility]   PHYSICIAN:  Evan [name], M.D.     DATE OF BIRTH:  04/08/1931   DATE OF PROCEDURE:  02/09/2004  DATE OF DISCHARGE:                                 OPERATIVE REPORT   PREOPERATIVE DIAGNOSES:  1.  Left hallux valgus.  2.  Left second metatarsalgia.  3.  Left second and third hammertoes.   POSTOPERATIVE DIAGNOSES:  1.  Left hallux valgus.  2.  Left second metatarsalgia.  3.  Left second and third hammertoes.   OPERATION:  1.  Left Chevron bunionectomy.  2.  Stress x-rays, left foot.  3.  Left second Weil metatarsal shortening osteotomy.  4.  Left second and third toes, MTP joint dorsal capsulotomies with      collateral releases.  5.  Left second and third toes proximal phalanx head resections.  6.  Left second and third toes FDL to proximal phalanx transfers.  7.  Left second and third toes EDB to EDL transfers.   ANESTHESIA:  General endotracheal tube with popliteal block.   ESTIMATED BLOOD LOSS:  Minimal.   TOURNIQUET TIME:  Approximately an hour.   COMPLICATIONS:  None.   DISPOSITION:  Stable to PR.   INDICATIONS:  This is a 77 year old male who has had long-standing left  forefoot pain that was interfering with his life and what he could do.  He  was consented for the above procedure.  All risks which included infection  and neurovascular injury, nonunion, malunion, hardware addition or hardware  failure, persistent pain, worse pain, stiffness, recurrence of deformity,  prolonged recovery again were all explained.  Questions were encouraged and  answered.   DESCRIPTION OF PROCEDURE:  The patient was brought to the operating room,  placed in supine position.  After adequate general endotracheal tube  anesthesia with popliteal block was administered, as well as Ancef 1 g IVPB,  left  lower extremity was then prepped and draped in sterile manner,  proximally placed thigh tourniquet.  Limb was gravity exsanguinated.  Tourniquet  was elevated to 290 mmHg.  Longitudinal incision over the medial  aspect of the left great toe MTP joint was then made.  Dissection was  carried down through skin.  Hemostasis was obtained.  Neurovascular  structures were identified both superiorly and inferiorly and protected  throughout the case.  L-shaped capsulotomy was then performed in the  capsule.  Bunionectomy was performed with a sagittal saw.  Lateral capsule  was then released from within the joint.  The center of the head was then  identified and Chevron osteotomy was then created.  Head was then translated  approximately 3-4 mm laterally and then fixed with a 2.0-mm fully-threaded  cortical set screw using a 1.5-mm drill hole respectively.  Redundant bone  medially was then sawed off with a sagittal saw.  __________ ridge was then  rounded off with a rongeur.  The joint and area were copiously irrigated  with normal saline.  Capsule was then advanced both superiorly and  proximally, fixed with 2-0 Vicryl suture.  This had excellent repair.  We  then obtained stress x-rays in the AP and lateral planes, which showed that  fixation in a proper position, as well as the osteotomy correction, and  there was no gross motion of the osteotomy site, as well as the fact that  sesamoids were in adequate position as well.  We then made a longitudinal  incision over the second toe.  Dissection was carried through skin.  Hemostasis was obtained.  Extensor digitorum longus and brevis tendons were  identified with the longus proximal medial and the brevis distal lateral.  We then identified the MTP joint and dorsal capsulotomy with collateral  release was then performed, protecting the soft tissues both medial and  laterally by the assistant.  This was done with a 15 blade scalpel.  We then  entered  the PIP joint.  The distal aspect of the proximal phalanx was the  skeletonized.  The head was then removed with a rongeur, followed by a bone  cutter.  We then identified the FDL tendon.  This was then tenotomized and  pulled through the drill hole and reconstructed dorsally and anchored  dorsally temporarily.  We then placed a K wire antegrade through the middle  and distal phalanx and retrograde across the proximal phalanx with the toe  held in reduced position for now.  We then plantar flexed the toe.  With a  sagittal saw and stack blades, we then performed an osteotomy through the  second metatarsal head.  Head was then translated several millimeters  proximally and fixed with a 12-mm long [employer] screw.  This had  excellent purchase and maintenance of the correction.  With the toe held in  reduced position and tension on the FDL tendon, we then advanced the K wire  across the MTP joint.  The area was copiously irrigated with normal saline.  The EB was then translated to the EBL dorsally with 3-0 PDS suture and also  sewn to the FDL tendon dorsally as well through the drill hole in the  proximal phalanx where the FDL was transferred to the proximal phalanx.  The  area was copiously irrigated with normal saline.  The same procedure was  performed to the third toe through a separate incision, except we did not do  the Weil osteotomy.  X-rays were obtained to verify the length of the second  metatarsal shaft.  Once the area was copiously irrigated with normal saline,  tourniquet ws placed and hemostasis was obtained, skin was closed with 4-0  nylon suture over all wounds.  Sterile dressing was applied.  Roger Mann  dressing was applied.  Hard sole shoe was applied.  The patient stable to  PAR.      PB/MEDQ  D:  02/09/2004  T:  02/09/2004  Job:  [id number]

## 2010-06-09 NOTE — Cardiovascular Report (Signed)
[facility]. [facility]  Patient:    Evan [name], Evan [name] Visit Number: [id number] MRN: [id number]          Service Type: MED Location: [phone] Attending Physician:  Ricki Rodriguez Dictated by:   Evan [name] Sharyn Lull, M.D. Proc. Date: 07/24/01 Admit Date:  07/24/2001   CC:         Cath Lab  Ricki Rodriguez, M.D.   Cardiac Catheterization  PROCEDURE:  Left cardiac catheterization with selective left and right coronary angiography, left ventriculography, insertion of temporary transvenous pacer via right groin using Judkins technique.  Successful PTCA to mid RCA using 3.5 x 15 mm long CrossSail balloon.  Successful deployment of 4.5 x 20 mm long Express II stent in the proximal and midjunction of RCA.  INDICATIONS:  Evan [name] is a 77 year old white male with past medical history significant for hypertension, history of rheumatoid arthritis, history of rheumatoid lung disease, history of tobacco abuse in the past, who came to the ER via EMS complaining of retrosternal and vague abdominal pain associated with nausea.  He initially took [place] without any relief which started around 6 a.m.  The pain radiated to the neck, jaw, left arm.  He called EMS and the patient received three sublingual nitroglycerin and aspirin by EMS with partial relief of chest pain.  EKG done in the ER showed normal sinus rhythm with ST elevation up to 2.5 to 3 mm in inferolateral leads with stress changes in aVL suggestive of inferolateral injury pattern.  Discussed with the patient and his wife regarding various options of treatment, ie, thrombolytic therapy versus invasive left catheterization and possible PTCA and stenting, its risks, ie, death, MI, or stroke, need for emergency CABG, risk of restenosis, local vascular complications, etc., and consented for PCI.  PAST MEDICAL HISTORY:  As above.  PAST SURGICAL HISTORY:  He had hammertoe surgery in the past.  He had bilateral  cataract extractions in the past and has varicose vein surgery done in the past.  HOME MEDICATIONS: 1. Methotrexate 2.5 mg five tablets weekly. 2. Diltiazem 10 mg q.d. 3. Diovan HCT 160/12.5 mg p.o. q.d. 4. Elavil 50 mg p.o. q.d. 5. Actonel 30 mg weekly. 6. Folic acid two p.o. q.d. 7. Os-Cal two q.d.  SOCIAL HISTORY:  He is married and has one son and one daughter.  He used to smoke heavily three packs per day, he quit many years ago.  PHYSICAL EXAMINATION:  GENERAL: He was alert and oriented x 3.  VITAL SIGNS: Blood pressure 136/80, pulse 65 and regular.  HEENT: Conjunctivae pink. NECK: Supple, no JVD.  LUNGS: Clear to auscultation.  HEART: S1 and S2 was normal. There was a soft S4 gallop at the apex.  ABDOMEN: Soft, bowel sounds were present.  EXTREMITIES: No clubbing, cyanosis, or edema.  I discussed the patient and his wife regarding various options of treatment and consented for PCI.  DESCRIPTION OF PROCEDURE:  After obtaining the informed consent, the patient was brought to the catheterization lab and was placed on fluoroscopy table. Right groin was prepped and draped in the usual fashion.  2% Xylocaine was used for local anesthesia in the right groin.  With the help of thin wall needle, a 7 French arterial and 6 French venous sheaths were placed.  Both of the sheaths were aspirated and flushed.  Next, 6 French left Judkins catheter was advanced over the wire under fluoroscopic guidance up to the ascending aorta. Wire was pulled out.  The catheter was aspirated and connected to the manifold.  The catheter was further advanced and engaged into left coronary ostium.  Multiple views of the left system were taken.  Next, the catheter was disengaged and pulled out over the wire and was replaced with 6 French right Judkins catheter which was advanced over the wire under fluoroscopic guidance up to the ascending aorta.  The wire was pulled out, the catheter was aspirated and  connected to the manifold.  The catheter was further advanced and engaged into the right coronary ostium.  Multiple views of the right coronary arteries were obtained.  Next, the catheter was disengaged and was pulled out over the wire.  Sheaths were aspirated and flushed.  FINDINGS:  LV was done at the end of the procedure.  LV showed inferior wall hypokinesia with mid inferior wall dyskinesia, EF of 45 to 50%.  Left main was patent.  LAD has 20 to 30% proximal and 70% focal mid and distal junction stenosis.  Distally vessel has 20 to 30% stenosis.  Diagonal 1 is small which is patent.  Diagonal 2 has 80% focal stenosis which is very very small vessel. Diagonal 3 small which is patent.  Left circumflex has 10 to 20% proximal stenosis and then tapers down in AV groove after giving off large OM2. OM1 is very small which is patent.  OM2 is large which branches into two inferior and superior branches which is patent.  RCA has 95% mid stenosis with ulcerated plaque.  INTERVENTIONAL PROCEDURE:  PTCA to mid RCA was done using 3.5 x 15 mm long CrossSail ballon for predilatation.  Angiogram showed TIMI 1 to TIMI 2 flow which normalized to TIMI 3 flow after giving intracoronary Verapamil and nitroglycerin.  Choice PT wire was exchanged over Transit catheter with 0.014 Mailman without difficulty and then attempted to deploy a 4.5 x 20 mm Express II stent in mid RCA.  While advancing the stent, the balloon slipped back in the proximal and mid junction of RCA.  Attempted to readvance the balloon inside the stent gently without success and then 3.0 x 20 mm long CrossSail balloon was used which advanced easily inside the stent and the stent was deployed in proximal and mid junction of RCA at 15 atmospheres of pressure and then 5.0 x 15 mm long Quantum Ranger balloon was used to fully expand the stent going up to 13 atmospheres of pressure.  Angiogram showed haziness at  the distal edge of the stent  and then 4.0 x 15 mm Quantum Ranger balloon was used to fully expand the stent distally.  Attempted to deploy a 4.0 x 12 mm long AVE stent in mid RCA, but this stent tracked down.  Multiple guiding catheters were used to get better guiding support without success.  The patient had 20% mid RCA lesion with TIMI 3 distal flow in the vessel.  The patient is chest pain free.  At the end of the procedure, the patient received weight-based heparin, Reopro, and 300 mg of Plavix during the procedure. Temporary transvenous pacemaker was inserted via right femoral approach which was discontinued at the end of the procedure.  The patient tolerated the procx well.  There were no complications.  The patient was transferred to the CCU in stable condition. Dictated by:   Evan [name] Sharyn Lull, M.D. Attending Physician:  Ricki Rodriguez DD:  07/24/01 TD:  07/28/01 Job: [id number]

## 2010-06-09 NOTE — Assessment & Plan Note (Signed)
[facility]                             PULMONARY OFFICE NOTE   Reyn, Faivre ABDURAHMAN RUGG                     MRN:          [id number]  DATE:02/22/2086                            DOB:          [birth year]    Mr. Amyx returns today in followup, is doing well with no real active  complaints of cough or dyspnea.  He has underlying rheumatoid arthritis  with pulmonary fibrosis, asthmatic bronchitis, emphysema, and chronic  obstructive pulmonary disease.  He is being managed on Spiriva one  capsule daily and Qvar two sprays daily 40 mcg strength.  Prednisone is  at 5 mg daily.  Other medicines are listed in the chart and are correct  as reviewed.   EXAMINATION:  VITAL SIGNS:  Temperature was 97.8, blood pressure 104/70,  pulse 74, saturation 96% in room air.  CHEST:  Showed distant breath sounds with prolonged expiratory phase.  There was no evidence of wheeze or rhonchi.  CARDIAC:  Showed a regular rate and rhythm without S3; normal S1, S2.  ABDOMEN:  Soft, nontender.  EXTREMITIES:  Showed no edema or clubbing.  SKIN:  Clear.  NEUROLOGIC:  Intact.  HEENT:  Showed no jugular venous distention or lymphadenopathy,  oropharynx clear, neck supple.   IMPRESSION:  That of asthmatic bronchitis, chronic obstructive pulmonary  disease, primary emphysematous component.   Plan is to maintain inhaled medicines as currently dosed and will see  the patient back in return followup in 6 months.     Charlcie Cradle Delford Field, MD, [facility]  Electronically Signed    PEW/MedQ  DD: 02/22/2006  DT: 02/22/2006  Job #: [id number]   cc:   Maxwell Caul, M.D.

## 2010-06-09 NOTE — Assessment & Plan Note (Signed)
[facility]                               PULMONARY OFFICE NOTE   Roby, Evan [name]                     MRN:          [id number]  DATE:09/18/2005                            DOB:          04/20/1931    Mr. Evan [name] is a 77 year old white male with a history of pulmonary fibrosis,  secondary to rheumatoid arthritis, underlying chronic obstructive lung  disease, emphysema.  Overall he is markedly improved following reflux  control with Zegerid 40 mg daily.  Maintains Spiriva 1 spray daily and QVAR  80 microgram strength 2 sprays b.i.d. via a spacer device.  His cough is  much improved as is his dyspnea.   EXAMINATION:  VITAL SIGNS:  Temperature 98. Blood pressure 114/70. Pulse 86.  Saturation was 94% on room air.  CHEST:  Showed diminished breath sounds with prolonged expiratory phase, no  wheeze or rhonchi noted.  CARDIAC:  Showed a regular rate and rhythm without S3.  Normal S1, S2.  ABDOMEN:  Soft, non-tender.  Bowel sounds active.  EXTREMITIES:  Showed no edema or clubbing.  SKIN:  Clear.  NEUROLOGIC:  Neurologic exam is intact.  HEENT:  Showed no jugular venous distention, lymphadenopathy. Oropharynx  clear.  NECK:  Supple.   IMPRESSION:  Impression here would be that of chronic obstructive lung  disease, pulmonary fibrosis, reflux disease.   PLAN:  1. Plan is for this patient is to maintain QVAR as is.  2. Finish course of Zegerid.  3. Will see the patient back in followup in three months.                                   Charlcie Cradle Delford Field, MD, [facility]   PEW/MedQ  DD:  09/18/2005  DT:  09/19/2005  Job #:  [id number]   cc:   Maxwell Caul, MD

## 2010-06-09 NOTE — Assessment & Plan Note (Signed)
[facility]                               PULMONARY OFFICE NOTE   Virgilio, Broadhead DACEN FRAYRE                     MRN:          [id number]  DATE:08/16/2005                            DOB:          07/16/1931    Mr. Formisano returns today in followup.  This is a 77 year old, white male  with history of pulmonary fibrosis secondary to rheumatoid arthritis,  chronic obstructive pulmonary disease with asthmatic bronchitis and prior  emphysema, ex-smoker.  The patient is still having some productive cough of  thick, yellow mucus, once in the morning and in the afternoon, but this is  overall less than before.  He is still having some shortness of breath with  activity.  Not able to do lots of activity because of weakness and fatigue.  The patient maintains QVAR now at three sprays b.i.d. via spacer.  He has  had less dysphonia and less Candidiasis in the throat since starting this  maintaining Spiriva one capsule daily.  He tapered prednisone down to 10 mg  a day and is holding at that level.  He has finished his course of Levaquin.   PHYSICAL EXAMINATION:  VITAL SIGNS:  Temperature 97, blood pressure 120/70,  pulse 92, saturations 93% on room air.  CHEST:  Marked reduction in bilateral dry rales at the bases with improved  breath sounds.  CARDIAC:  Regular rate and rhythm without S3, normal S1, S2.  ABDOMEN:  Soft.  EXTREMITIES:  No edema or clubbing.  SKIN:  Clear.   Spirometry obtained today showed an FEV-1 of 3.92, FVC of 4.2, FEV-1/FVC  ratio of 93%.  These are values compatible with improved spirometry compared  to previous values.   IMPRESSION:  Improved airway inflammation and improved airway dilation, but  upper airway irritability syndrome due to potential occult reflux.   PLAN:  The patient is to begin Zegerid 40 mg a day for a 6-week course.  Samples were given.  Will continue Spiriva daily.  Reduce QVAR to two sprays  b.i.d., 80 mcg strength.   Reduce prednisone to 5 mg a day and hold.  Will  see this patient back in followup in 1 month.                                   Charlcie Cradle Delford Field, MD, [facility]   PEW/MedQ  DD:  08/16/2005  DT:  08/16/2005  Job #:  [id number]   cc:   Maxwell Caul, MD  Demetria Pore. Coral Spikes, MD

## 2010-06-09 NOTE — Cardiovascular Report (Signed)
NAME:  Evan [name], Evan [name]                        ACCOUNT NO.:  [id number]   MEDICAL RECORD NO.:  [id number]                   PATIENT TYPE:  OIB   LOCATION:  2899                                 FACILITY:  [facility]   PHYSICIAN:  Francisca December, M.D.               DATE OF BIRTH:  February 08, 1931   DATE OF PROCEDURE:  02/25/2002  DATE OF DISCHARGE:                              CARDIAC CATHETERIZATION   PROCEDURE PERFORMED:  1. Left heart catheterization.  2. Coronary angiography.  3. Left ventriculogram.  4. Abdominal aortogram.  5. Intravascular ultrasound.  6. Unsuccessful attempted percutaneous transluminal coronary angioplasty,     mid right coronary artery.   INDICATIONS FOR PROCEDURE:  The patient is a 77 year old man who is now six  months status post acute inferior wall myocardial infarction, treated with  direct angioplasty with stent implantation.  The stent was known to have  been deployed unsuccessfully and did not completely cover the lesion.  He  has done reasonably well without significant angina.  He underwent a  surveillance myocardial perfusion study which showed a moderate, partially  reversible inferior wall defect, with an EF of 61%.  He is brought to the  catheterization laboratory at this time to identify possible recurrent  disease and provide for further therapeutic options.   PROCEDURAL NOTE:  The patient was brought to the cardiac catheterization  laboratory in the postabsorptive state.  The right groin was prepped and  draped in the usual sterile fashion.  Local anesthesia was obtained with the  infiltration of 1% lidocaine.  A 5-French catheter sheath was inserted  percutaneously into the right femoral artery utilizing an anterior approach  over a guiding J wire.  Initial attempts to pass the guidewire with  subsequent pigtail catheter were unsuccessful due to tortuosity in the iliac  artery.  Two separate iliac artery angiograms via the arterial sheath were  required, as well as a Wholey wire and then a Glidewire, which was  ultimately successful.  The catheterization then proceeded in the standard  fashion utilizing a 5-French 110-cm pigtail catheter, which was used to  measures in the ascending aorta and in the left ventricle, both prior to and  following the ventriculogram.  A 30-degree RAO cine left ventriculogram was  performed utilizing a power injector.  The pigtail catheter was then  withdrawn into the abdominal aorta, and an AP cineangiogram of the abdominal  aorta and proximal iliacs was obtained, again, with a power injector.  The  pigtail catheter was exchanged over a long guiding J wire for a 5-French #4  left Judkins catheter.  Following the sublingual administration of 0.4 mg  nitroglycerin, cineangiography of the left coronary artery was conducted in  multiple LAO and RAO projections.  The 5-French #4 left Judkins catheter was  exchanged for a 5-French #4 right Judkins catheter.  Cineangiography of the  right coronary artery was conducted in multiple LAO  and RAO projections.  All catheter manipulations were performed, using fluoroscopic observation,  and exchanges performed over a long guiding J wire.   I then proceeded with intravascular ultrasound.  The 5-French catheter  sheath was exchanged for a long 47-mm 7-French catheter sheath, again, over  a long guiding J wire and under fluoroscopic observation.  A 7-French AL2  guiding catheter was advanced to the ascending aorta where the right  coronary artery was deeply intubated.  The patient did receive an initial  3500 units of heparin.  Subsequent ACT was found to be 186 seconds, and he  received an additional 1000 units of heparin.  Subsequent ACT was greater  than 200.  A 0.014 Scimed luge intracoronary guidewire was passed across the  moderate stenosis in the mid right coronary with some difficulty.  The  intravascular ultrasound, Scimed Atlantis catheter, was then  advanced, again  with some difficulty.  A single pullback with a digital recording device was  obtained, and the images were analyzed.  The ultrasound catheter was then  removed, and then I proceeded with attempted PTCA.  The patient received an  additional 1500 units of heparin for a total of 5500 units of heparin during  the procedure.  He had previously received Plavix and aspirin.  An attempt  to pass a 4.0 x 12-mm Medtronic S7 intracoronary stent over a 0.014-inch  Scimed luge intracoronary guidewire was unsuccessful.  There was inadequate  backup, despite the use of an Amplatz catheter and deep intubation.  The  same intracoronary stent was attempted over a 0.014-inch Scimed mailman  intracoronary wire.  Again, this was unsuccessful.  Finally, the mailman  wire was left in place, and a second luge wire advanced in a buddy system.  A final attempt was made to pass the S7 stent over the luge wire which,  again, was unsuccessful.  There was excessive rigidity and friction in the  proximal to mid portion of the right coronary.  The device would simply not  pass into the mid portion.  Finally, further attempts were discontinued, and  angiography performed in orthogonal views to confirm persistent wide patency  of the right coronary.  The guiding catheter was removed, and hemostasis was  achieved by suturing the sheath in place.  The patient was transported to  the recovery area in stable condition with an intact distal pulse.   HEMODYNAMICS:  1. Systemic arterial pressure was 137/82 with a mean of 107 mmHg.  2. There was no systolic gradient across the aortic valve.  3. The left ventricular end diastolic pressure was 14 mmHg, pre     ventriculogram, and 19 mmHg post ventriculogram.   INTRAVASCULAR ULTRASOUND:  The lesion that was attempted to be treated had a  cross-sectional area of 7.2 mm.  This was a very large right coronary vessel.  The reference lumen was 5.5 x 5.1 mm, resulting  in a cross-  sectional area of 22 mm sq.  The area of stenosis was calculated at 77%.   ANGIOGRAPHY:  1. The left ventriculogram demonstrated intact left ventricular size and     global systolic function without regional wall motion abnormality.  The     left and right coronary calcification was present.  There was no     significant mitral regurgitation.  The calculated ejection fraction,     utilizing a single plane cine method, was 59%.  2. There was a right dominant coronary system present.  The main left  coronary artery was normal.  3. The left anterior descending artery and its branches were without     significant obstruction.  The vessel is tortuous and gives rise to three     moderate-sized diagonal branches.  The ongoing vessel has luminal     irregularities, reaches and barely traverses the apex.  Again, there are     no significant obstructions in the anterior descending.  4. The left circumflex coronary artery and its branches are also without     significant obstruction.  There is a 20-30% narrowing in the mid to     distal circumflex after the origin of the second small marginal.  There     are two small first and second marginals.  There is a large bifurcating     third marginal which is the dominant vessel on the obtuse margin.  The     ongoing circumflex is without significant obstruction.  5. The right coronary artery and its branches were moderately diseased; the     vessel has luminal irregularities in the proximal segment and then shows     calcific changes and, of course, the previously placed stent which begins     in the proximal segment at the origin of the sinus node artery and     extends into the mid portion.  At the distal end of the stent, there is a     tubular concentric 50-55% stenosis.  The ongoing vessel contains luminal     irregularities, but it is without significant obstruction.  It does give     rise to a moderate-sized posterior descending  artery, and a moderate-     sized posterolateral segment, with three posterolateral or left     ventricular branches.  Again, no obstruction is seen within this vessel.     In the distal vessel at the origin of an acute marginal branch, there is     a 20-30% stenosis.  6. Following attempted PTCA but without balloon dilatation, there was no     significant change in the 50-55% stenosis.  By quantitative angiography,     the lesion was measured at 51% stenosis.  7. The abdominal aortogram demonstrated diffuse peripheral disease with     tortuosity of the distal aorta and a small aneurysm.  The proximal iliacs     are markedly tortuous and aneurysmally dilated, especially the left     common iliac.  Both renal arteries are visualized briefly and are without     significant obstruction.  8. Iliac artery angiography, performed at the initiation of the procedure,     again showed a 30-40% stenosis in the internal iliac junction with the    common femoral.  This is the lesion that was causing some difficulty with     passing the initial guidewire.   FINAL IMPRESSION:  1. Atherosclerotic coronary vascular disease, single-vessel.  2. Atherosclerotic peripheral vascular disease, with aneurysmal dilatation,     of the distal aorta and the left common iliac, to a mild to moderate     degree.  3. Intact left ventricular size and global systolic function.  4. Unsuccessful attempted percutaneous transluminal coronary angioplasty,     mid right coronary artery.  Francisca December, M.D.    JHE/MEDQ  D:  02/25/2002  T:  02/25/2002  Job:  [id number]   cc:   Bryan Lemma. Manus Gunning, M.D.  [address] [place]  [place] [address]  Fax: [phone]   Cardiac Catheterization Lab

## 2010-06-28 NOTE — Discharge Summary (Signed)
  NAMEPRENTISS, POLIO              ACCOUNT NO.:  [id number]  MEDICAL RECORD NO.:  [id number]           PATIENT TYPE:  I  LOCATION:  2301                         FACILITY:  [facility]  PHYSICIAN:  Juleen China IV, MDDATE OF BIRTH:  08-27-1931  DATE OF ADMISSION:  06/02/2010 DATE OF DISCHARGE:  06/03/2010                              DISCHARGE SUMMARY   ADMISSION DIAGNOSIS:  Abdominal and iliac aneurysms.  HISTORY OF PRESENT ILLNESS:  This is a 77 year old gentleman with abdominal and iliac aneurysms.  He has undergone left hypogastric branch ligation previously in anticipation of his repair.  He comes in today for his procedure.  At that time, Dr. Myra Gianotti and the patient discussed not addressing his femoral aneurysms at this time in order to be able to perform this procedure percutaneously with his underlying pulmonary fibrosis, we would like to avoid general anesthesia.  HOSPITAL COURSE:  The patient was admitted to the hospital and taken to the operating room on Jun 02, 2010, where he underwent endovascular of abdominal iliac aneurysm as well bilateral ultrasound percutaneous access as well distal extension x2, catheter in aorta x2 and abdominal aortogram.  He tolerated procedure well and was transported to the recovery room in satisfactory condition.  His hospital course was unremarkable, and he was discharged home the next day.  DISCHARGE DIAGNOSES: 1. Abdominal iliac aneurysm.     a.     Status post endovascular repair of abdominal iliac aneurysm      on Jun 02, 2010. 2. Hypertension, which is medically managed. 3. History of myocardial infarction in 2009.     a.     Stent attempted, but unsuccessful. 4. Pulmonary fibrosis followed by Dr. Delford Field. 5. Home O2 at 4 L.  DISCHARGE MEDICATIONS: 1. Actonel 35 mg p.o. weekly. 2. Calcium 500 mg p.o. b.i.d. 3. Restoril 50 mg p.o. at bedtime. 4. Pepcid AC 1 tablet p.o. daily. 5. Azathioprine 50 mg 4 tablets daily. 6. Nexium 40  mg p.o. daily. 7. Metoprolol XL 50 mg p.o. daily. 8. Zetia 10 mg p.o. daily. 9. Spiriva 18 mcg 1 capsule daily. 10.Beclomethasone inhalation 80 mcg 2 puffs b.i.d. 11.Aspirin 81 mg p.o. daily. 12.Multivitamin p.o. daily. 13.Avapro 150 mg p.o. daily. 14.Zocor 40 mg p.o. daily.  FOLLOWUP:  The patient is to follow up with Dr. Myra Gianotti IV.    ______________________________ Newton Pigg, PA   ______________________________ V. Charlena Cross, MD    SE/MEDQ  D:  06/16/2010  T:  06/17/2010  Job:  [id number]  Electronically Signed by Newton Pigg PA on 06/20/2010 10:01:18 AM Electronically Signed by Arelia Longest IV MD on 06/28/2010 01:27:54 PM

## 2010-08-08 ENCOUNTER — Other Ambulatory Visit: Payer: Self-pay | Admitting: Critical Care Medicine

## 2010-08-08 DIAGNOSIS — J449 Chronic obstructive pulmonary disease, unspecified: Secondary | ICD-10-CM

## 2010-08-10 NOTE — Telephone Encounter (Signed)
Received refill request from pharm for imuran which was sent back approved.  However, I see in pt's chart he was told at last OV on 02/21/10 with PW to follow up in early April 2012 with PFTs and CXR at that time.  I called and spoke with pt to follow up on this.  He is ok to schedule an appt which was scheduled for Aug 3.  PFTs at 9 am and OV at 10:15.  Pt aware of this.  I placed order for PFTs so this can be linked to the appt per protocol.

## 2010-08-25 ENCOUNTER — Ambulatory Visit (INDEPENDENT_AMBULATORY_CARE_PROVIDER_SITE_OTHER): Payer: Medicare Other | Admitting: Critical Care Medicine

## 2010-08-25 ENCOUNTER — Other Ambulatory Visit (INDEPENDENT_AMBULATORY_CARE_PROVIDER_SITE_OTHER): Payer: Medicare Other

## 2010-08-25 ENCOUNTER — Encounter: Payer: Self-pay | Admitting: Critical Care Medicine

## 2010-08-25 ENCOUNTER — Ambulatory Visit (INDEPENDENT_AMBULATORY_CARE_PROVIDER_SITE_OTHER)
Admission: RE | Admit: 2010-08-25 | Discharge: 2010-08-25 | Disposition: A | Discharge status: Home / Self Care | Payer: Medicare Other | Source: Ambulatory Visit | Attending: Critical Care Medicine | Admitting: Critical Care Medicine

## 2010-08-25 VITALS — BP 120/80 | HR 86 | Temp 97.7°F | Ht 72.0 in | Wt 181.0 lb

## 2010-08-25 DIAGNOSIS — J449 Chronic obstructive pulmonary disease, unspecified: Secondary | ICD-10-CM

## 2010-08-25 DIAGNOSIS — J8410 Pulmonary fibrosis, unspecified: Secondary | ICD-10-CM

## 2010-08-25 LAB — HEPATIC FUNCTION PANEL
ALT: 14 U/L (ref 0–53)
AST: 20 U/L (ref 0–37)
Albumin: 4.2 g/dL (ref 3.5–5.2)
Total Bilirubin: 0.5 mg/dL (ref 0.3–1.2)

## 2010-08-25 LAB — CBC WITH DIFFERENTIAL/PLATELET
Basophils Absolute: 0 10*3/uL (ref 0.0–0.1)
Eosinophils Relative: 2.7 % (ref 0.0–5.0)
HCT: 31.3 % — ABNORMAL LOW (ref 39.0–52.0)
Hemoglobin: 10.4 g/dL — ABNORMAL LOW (ref 13.0–17.0)
Lymphs Abs: 0.6 10*3/uL — ABNORMAL LOW (ref 0.7–4.0)
MCV: 109.3 fl — ABNORMAL HIGH (ref 78.0–100.0)
Monocytes Absolute: 0.4 10*3/uL (ref 0.1–1.0)
Monocytes Relative: 9.6 % (ref 3.0–12.0)
Neutro Abs: 3.5 10*3/uL (ref 1.4–7.7)
Platelets: 226 10*3/uL (ref 150.0–400.0)
RDW: 15.6 % — ABNORMAL HIGH (ref 11.5–14.6)

## 2010-08-25 LAB — PULMONARY FUNCTION TEST

## 2010-08-25 NOTE — Progress Notes (Signed)
Subjective:    Patient ID: Evan [name], male    DOB: 09-04-1931, 77 y.o.   MRN: [id number]  HPI 77 y.o.   wm quit smoker around 1980 history of chronic obstructive airways disease exacerbated by chronic rheumatoid arthritis, associated pulmonary fibrosis, on 02 since 2008  August 17, 2009 9:39 AM  The pt now notes some yellow mucus sl more than before. No chest pain. Dyspnea is the same, not able to exert self. No new issues.  Edema is better with support hose. No chest pain.  October 24, 2009 --[place]. He has several issues today. Says his breathing is at baseline. Complains of constant heartburn with belching and reflux. Uses tums with some help but keeps coming back. Has daily constipation with hard stools. Uses miralax and senokot as needed. He does have chronic insominia and uses lunesta and melatonin at bedtime , Has also been taking his wife;s ambien -takes 20mg  in addition to lunesta and melatonin. I advised him in the dangers of combining sleep aides. and we discussed several options. Have advised him to disucss this with his PCP for further recommendations. Denies chest pain, dyspnea, orthopnea, hemoptysis, fever, n/v/d, edema, headache,bloody stools, weight loss, syncope, daytime hypersolomennece. He has tapered off elavil 2-3 weeks ago, does not want to restart , advised to discuss with Dr. Nehemiah Settle.  November 15, 2009 10:42 AM  The pt over planted in the yard and is weak but at baseline. Not much cough now. No real wheeze. Not much heartburn, and constipation is better and this helps. No new issues.  Pt denies any significant sore throat, nasal congestion or excess secretions, fever, chills, sweats, unintended weight loss, pleurtic or exertional chest pain, orthopnea PND, or leg swelling  February 21, 2010 4:07 PM  Not much cough since 10/11. Min cough. Not much wheeze. No real chest pain. No edema in feet. No joint issues.  No real change in dyspnea. No new issues.    Pt denies any significant sore throat, nasal congestion or excess secretions, fever, chills, sweats, unintended weight loss, pleurtic or exertional chest pain, orthopnea PND, or leg swelling  Pt denies any increase in rescue therapy over baseline, denies waking up needing it or having any early am or nocturnal exacerbations of coughing/wheezing/or dyspnea.   08/25/2010 Since last ov no real changes but ?? If worse. More fatigue.  ? More dypsnea with exertion.   Pt denies any significant sore throat, nasal congestion or excess secretions, fever, chills, sweats, unintended weight loss, pleurtic or exertional chest pain, orthopnea PND, or leg swelling Pt denies any increase in rescue therapy over baseline, denies waking up needing it or having any early am or nocturnal exacerbations of coughing/wheezing/or dyspnea. Pt also denies any obvious fluctuation in symptoms with  weather or environmental change or other alleviating or aggravating factors    Review of Systems Constitutional:   No  weight loss, night sweats,  Fevers, chills, fatigue, lassitude. HEENT:   No headaches,  Difficulty swallowing,  Tooth/dental problems,  Sore throat,                No sneezing, itching, ear ache, nasal congestion, post nasal drip,   CV:  No chest pain,  Orthopnea, PND, swelling in lower extremities, anasarca, dizziness, palpitations  GI  No heartburn, indigestion, abdominal pain, nausea, vomiting, diarrhea, change in bowel habits, loss of appetite  Resp: No shortness of breath with exertion or at rest.  No excess mucus, no  productive cough,  No non-productive cough,  No coughing up of blood.  No change in color of mucus.  No wheezing.  No chest wall deformity  Skin: no rash or lesions.  GU: no dysuria, change in color of urine, no urgency or frequency.  No flank pain.  MS:  No joint pain or swelling.  No decreased range of motion.  No back pain.  Psych:  No change in mood or affect. No depression or anxiety.   No memory loss.     Objective:   Physical Exam Filed Vitals:   08/25/10 1018  BP: 120/80  Pulse: 86  Temp: 97.7 F (36.5 C)  TempSrc: Oral  Height: 6' (1.829 m)  Weight: 181 lb (82.101 kg)  SpO2: 94%    Gen: Pleasant, well-nourished, in no distress,  normal affect  ENT: No lesions,  mouth clear,  oropharynx clear, no postnasal drip  Neck: No JVD, no TMG, no carotid bruits  Lungs: No use of accessory muscles, no dullness to percussion,rales dry  Cardiovascular: RRR, heart sounds normal, no murmur or gallops, no peripheral edema  Abdomen: soft and NT, no HSM,  BS normal  Musculoskeletal: No deformities, no cyanosis or clubbing  Neuro: alert, non focal  Skin: Warm, no lesions or rashes   CXR 08/25/10 IMPRESSION: Unchanged findings compatible with pulmonary fibrosis. No acute findings.       Assessment & Plan:   PULMONARY FIBROSIS Pulm fibrosis d/t autoimmune process Slo reduction in DLCO, lung volumes stable  Plan No change in meds Cont oxygen    Updated Medication List Outpatient Encounter Prescriptions as of 08/25/2010  Medication Sig Dispense Refill  . aspirin 81 MG tablet Take 81 mg by mouth daily.        [name] Kitchen azaTHIOprine (IMURAN) 50 MG tablet TAKE 4 TABLETS BY MOUTH DAILY AS DIRECTED  120 tablet  6  . beclomethasone (QVAR) 80 MCG/ACT inhaler Inhale 2 puffs into the lungs 2 (two) times daily.        . Calcium Carbonate (CALCIUM 600) 1500 MG TABS Take 1 tablet by mouth 2 (two) times daily.        [name] Kitchen esomeprazole (NEXIUM) 40 MG capsule Take 40 mg by mouth daily before breakfast.        . ezetimibe (ZETIA) 10 MG tablet Take 10 mg by mouth daily.        . famotidine (PEPCID AC) 10 MG chewable tablet Chew 10 mg by mouth daily.        . irbesartan (AVAPRO) 150 MG tablet Take 150 mg by mouth at bedtime.        . metoprolol (TOPROL-XL) 50 MG 24 hr tablet Take 50 mg by mouth daily.        . risedronate (ACTONEL) 35 MG tablet Take 35 mg by mouth every 7 (seven)  days. with water on empty stomach, nothing by mouth or lie down for next 30 minutes.       . simvastatin (ZOCOR) 40 MG tablet Take 40 mg by mouth at bedtime.        [name] Kitchen SPIRIVA HANDIHALER 18 MCG inhalation capsule INHALE CONTENTS OF ONE CAPSULE ONCE DAILY  30 capsule  4  . temazepam (RESTORIL) 15 MG capsule Take 15 mg by mouth at bedtime as needed.

## 2010-08-25 NOTE — Patient Instructions (Signed)
No change in medications. Return in          4 months Labs today

## 2010-08-25 NOTE — Progress Notes (Signed)
PFT done today. 

## 2010-08-26 NOTE — Assessment & Plan Note (Signed)
Pulm fibrosis d/t autoimmune process Slo reduction in DLCO, lung volumes stable  Plan No change in meds Cont oxygen

## 2010-08-29 ENCOUNTER — Other Ambulatory Visit: Payer: Self-pay | Admitting: Adult Health

## 2010-08-30 ENCOUNTER — Encounter: Payer: Self-pay | Admitting: Critical Care Medicine

## 2010-08-30 NOTE — Progress Notes (Signed)
Quick Note:  Called, spoke with pt. He is aware all labs are ok per PW. He verbalized understanding of this and voiced no further questions at this time. ______

## 2010-10-24 LAB — DIFFERENTIAL
Basophils Absolute: 0
Basophils Relative: 0
Eosinophils Absolute: 0
Eosinophils Relative: 0
Lymphocytes Relative: 14
Lymphs Abs: 1.6
Monocytes Absolute: 0.4
Monocytes Relative: 3
Neutro Abs: 9.5 — ABNORMAL HIGH
Neutrophils Relative %: 82 — ABNORMAL HIGH

## 2010-10-24 LAB — COMPREHENSIVE METABOLIC PANEL WITH GFR
ALT: 16
AST: 23
Albumin: 3.5
Alkaline Phosphatase: 67
CO2: 25
Chloride: 105
GFR calc Af Amer: >60
GFR calc non Af Amer: 58 — ABNORMAL LOW
Potassium: 4.2
Sodium: 138
Total Bilirubin: 0.9

## 2010-10-24 LAB — CBC
HCT: 37.9 — ABNORMAL LOW
HCT: 44.3
Hemoglobin: 13.2
Hemoglobin: 14.9
MCHC: 33.6
MCV: 96.2
MCV: 97.3
Platelets: 166
Platelets: 234
RBC: 4.55
RDW: 13.8
RDW: 13.9
WBC: 11.6 — ABNORMAL HIGH

## 2010-10-24 LAB — BASIC METABOLIC PANEL
BUN: 16
CO2: 25
Chloride: 111
Glucose, Bld: 88
Potassium: 3.9

## 2010-10-24 LAB — CK TOTAL AND CKMB (NOT AT ARMC): Relative Index: INVALID

## 2010-10-24 LAB — URINALYSIS, ROUTINE W REFLEX MICROSCOPIC
Bilirubin Urine: NEGATIVE
Glucose, UA: 250 — AB
Hgb urine dipstick: NEGATIVE
Ketones, ur: 15 — AB
Nitrite: NEGATIVE
Protein, ur: NEGATIVE
Specific Gravity, Urine: 1.022
Urobilinogen, UA: 0.2
pH: 6

## 2010-10-24 LAB — TSH: TSH: 3.162

## 2010-10-24 LAB — CARDIAC PANEL(CRET KIN+CKTOT+MB+TROPI)
CK, MB: 1.5
Relative Index: INVALID
Total CK: 36
Troponin I: 0.01

## 2010-10-24 LAB — COMPREHENSIVE METABOLIC PANEL
BUN: 20
Calcium: 8.9
Creatinine, Ser: 1.22
Glucose, Bld: 221 — ABNORMAL HIGH
Total Protein: 6.1

## 2010-10-24 LAB — POCT CARDIAC MARKERS
Myoglobin, poc: 26.6
Troponin i, poc: <0.05

## 2010-10-24 LAB — LIPASE, BLOOD: Lipase: 38

## 2010-10-30 ENCOUNTER — Other Ambulatory Visit: Payer: Self-pay | Admitting: Critical Care Medicine

## 2010-12-27 ENCOUNTER — Other Ambulatory Visit (INDEPENDENT_AMBULATORY_CARE_PROVIDER_SITE_OTHER): Payer: Medicare Other

## 2010-12-27 ENCOUNTER — Encounter: Payer: Self-pay | Admitting: Critical Care Medicine

## 2010-12-27 ENCOUNTER — Ambulatory Visit (INDEPENDENT_AMBULATORY_CARE_PROVIDER_SITE_OTHER): Payer: Medicare Other | Admitting: Critical Care Medicine

## 2010-12-27 VITALS — BP 130/70 | HR 104 | Temp 97.8°F | Ht 72.0 in | Wt 188.8 lb

## 2010-12-27 DIAGNOSIS — J449 Chronic obstructive pulmonary disease, unspecified: Secondary | ICD-10-CM

## 2010-12-27 DIAGNOSIS — J8410 Pulmonary fibrosis, unspecified: Secondary | ICD-10-CM

## 2010-12-27 LAB — CBC WITH DIFFERENTIAL/PLATELET
Basophils Absolute: 0 10*3/uL (ref 0.0–0.1)
Eosinophils Relative: 3.2 % (ref 0.0–5.0)
HCT: 30.1 % — ABNORMAL LOW (ref 39.0–52.0)
Hemoglobin: 10.3 g/dL — ABNORMAL LOW (ref 13.0–17.0)
Lymphocytes Relative: 12.6 % (ref 12.0–46.0)
Lymphs Abs: 0.6 10*3/uL — ABNORMAL LOW (ref 0.7–4.0)
Monocytes Relative: 11 % (ref 3.0–12.0)
Neutro Abs: 3.7 10*3/uL (ref 1.4–7.7)
Platelets: 248 10*3/uL (ref 150.0–400.0)
WBC: 5.1 10*3/uL (ref 4.5–10.5)

## 2010-12-27 LAB — HEPATIC FUNCTION PANEL
Albumin: 3.8 g/dL (ref 3.5–5.2)
Total Protein: 7.2 g/dL (ref 6.0–8.3)

## 2010-12-27 NOTE — Progress Notes (Signed)
Subjective:    Patient ID: Evan [name], male    DOB: [birth year], 77 y.o.   MRN: [id number]  HPI  77 y.o.   wm quit smoker around 1980 history of chronic obstructive airways disease exacerbated by chronic rheumatoid arthritis, associated pulmonary fibrosis, on 02 since 2008  ation in symptoms with  weather or environmental change or other alleviating or aggravating factors  12/5 Feels worse since , takes less to get dyspneic.  No more cough.  No chest pain.  No edema in feet. Notes mild heartburn.  More fatigue. Pt denies any significant sore throat, nasal congestion or excess secretions, fever, chills, sweats, unintended weight loss, pleurtic or exertional chest pain, orthopnea PND, or leg swelling Pt denies any increase in rescue therapy over baseline, denies waking up needing it or having any early am or nocturnal exacerbations of coughing/wheezing/or dyspnea. Pt also denies any obvious fluctuation in symptoms with  weather or environmental change or other alleviating or aggravating factors     Review of Systems  Constitutional:   No  weight loss, night sweats,  Fevers, chills, fatigue, lassitude. HEENT:   No headaches,  Difficulty swallowing,  Tooth/dental problems,  Sore throat,                No sneezing, itching, ear ache, nasal congestion, post nasal drip,   CV:  No chest pain,  Orthopnea, PND, swelling in lower extremities, anasarca, dizziness, palpitations  GI  No heartburn, indigestion, abdominal pain, nausea, vomiting, diarrhea, change in bowel habits, loss of appetite  Resp: Notes  shortness of breath with exertion not at rest.  No excess mucus, notes  productive cough,  No non-productive cough,  No coughing up of blood.  No change in color of mucus.  Notes  wheezing.  No chest wall deformity  Skin: no rash or lesions.  GU: no dysuria, change in color of urine, no urgency or frequency.  No flank pain.  MS:  No joint pain or swelling.  No decreased range of motion.   No back pain.  Psych:  No change in mood or affect. No depression or anxiety.  No memory loss.     Objective:   Physical Exam  Filed Vitals:   12/27/10 1349  BP: 130/70  Pulse: 104  Temp: 97.8 F (36.6 C)  TempSrc: Oral  Height: 6' (1.829 m)  Weight: 188 lb 12.8 oz (85.639 kg)  SpO2: 90%    Gen: Pleasant, well-nourished, in no distress,  normal affect  ENT: No lesions,  mouth clear,  oropharynx clear, no postnasal drip  Neck: No JVD, no TMG, no carotid bruits  Lungs: No use of accessory muscles, no dullness to percussion,rales dry  Cardiovascular: RRR, heart sounds normal, no murmur or gallops, no peripheral edema  Abdomen: soft and NT, no HSM,  BS normal  Musculoskeletal: No deformities, no cyanosis or clubbing  Neuro: alert, non focal  Skin: Warm, no lesions or rashes   CXR 08/25/10 IMPRESSION: Unchanged findings compatible with pulmonary fibrosis. No acute findings.       Assessment & Plan:   COPD Copd and pulmonary fibrosis autoimmune Plan Cont imuran Note labs ok 12/27/10 No change in inhaled or maintenance medications. Return in   4 months  PULMONARY FIBROSIS Cont imuran     Updated Medication List Outpatient Encounter Prescriptions as of 12/27/2010  Medication Sig Dispense Refill  . aspirin 81 MG tablet Take 81 mg by mouth daily.        [name] Kitchen  azaTHIOprine (IMURAN) 50 MG tablet TAKE 4 TABLETS BY MOUTH DAILY AS DIRECTED  120 tablet  6  . beclomethasone (QVAR) 80 MCG/ACT inhaler Inhale 2 puffs into the lungs 2 (two) times daily.        . Calcium Carbonate (CALCIUM 600) 1500 MG TABS Take 1 tablet by mouth 2 (two) times daily.        [name] Kitchen ezetimibe (ZETIA) 10 MG tablet Take 10 mg by mouth daily.        . famotidine (PEPCID AC) 10 MG chewable tablet Chew 10 mg by mouth daily.        . irbesartan (AVAPRO) 150 MG tablet Take 150 mg by mouth at bedtime.        . metoprolol (TOPROL-XL) 50 MG 24 hr tablet Take 50 mg by mouth daily.        [name] Kitchen NEXIUM 40 MG  capsule TAKE 1 CAPSULE BY MOUTH ONCE DAILY  30 capsule  3  . simvastatin (ZOCOR) 40 MG tablet Take 40 mg by mouth at bedtime.        [name] Kitchen SPIRIVA HANDIHALER 18 MCG inhalation capsule INHALE CONTENTS OF ONE CAPSULE ONCE DAILY  30 capsule  5  . temazepam (RESTORIL) 15 MG capsule Take 15 mg by mouth at bedtime as needed.        [name] Kitchen DISCONTD: risedronate (ACTONEL) 35 MG tablet Take 35 mg by mouth every 7 (seven) days. with water on empty stomach, nothing by mouth or lie down for next 30 minutes.

## 2010-12-27 NOTE — Progress Notes (Signed)
Quick Note:  Call pt and tell her labs are ok, No change in medications ______ 

## 2010-12-27 NOTE — Patient Instructions (Signed)
Labs today No change in medications Return 4 months

## 2010-12-28 NOTE — Assessment & Plan Note (Signed)
Cont imuran

## 2010-12-28 NOTE — Assessment & Plan Note (Signed)
Copd and pulmonary fibrosis autoimmune Plan Cont imuran Note labs ok 12/27/10 No change in inhaled or maintenance medications. Return in   4 months

## 2010-12-30 ENCOUNTER — Other Ambulatory Visit: Payer: Self-pay | Admitting: Adult Health

## 2011-01-09 ENCOUNTER — Telehealth: Payer: Self-pay | Admitting: Critical Care Medicine

## 2011-01-09 MED ORDER — BECLOMETHASONE DIPROPIONATE 80 MCG/ACT IN AERS: 2.0000 | INHALATION_SPRAY | Freq: Two times a day (BID) | RESPIRATORY_TRACT | Status: DC

## 2011-01-09 NOTE — Telephone Encounter (Signed)
Pt aware his rx is at pharmacy nothing further needed.Evan [name]

## 2011-01-09 NOTE — Telephone Encounter (Signed)
RX has been sent per pharmacy call--[id number] for pt to make aware

## 2011-01-10 ENCOUNTER — Other Ambulatory Visit: Payer: Self-pay | Admitting: Allergy

## 2011-03-12 ENCOUNTER — Other Ambulatory Visit: Payer: Self-pay | Admitting: Critical Care Medicine

## 2011-04-26 ENCOUNTER — Other Ambulatory Visit: Payer: Self-pay | Admitting: Critical Care Medicine

## 2011-05-14 ENCOUNTER — Telehealth: Payer: Self-pay | Admitting: Critical Care Medicine

## 2011-05-14 NOTE — Telephone Encounter (Signed)
PT AWARE PW OK WITH THIS

## 2011-05-14 NOTE — Telephone Encounter (Signed)
i am ok with this trip 

## 2011-05-14 NOTE — Telephone Encounter (Signed)
Called and spoke with pt. Pt was last seen by PW 12/12 but has f/u scheudled for 06/20/11.  Pt states daughter recently moved to [place] and he would like to drive out to see here within the next few months or so. Pt is wanting to know if PW would approve this or not or would rather wait until pt's appt in May before deciding this.  Pt states he hasn't seen any significant change in breathing and denies any new concerns.  PW, please advise.  Thanks!

## 2011-06-20 ENCOUNTER — Ambulatory Visit (INDEPENDENT_AMBULATORY_CARE_PROVIDER_SITE_OTHER): Payer: Medicare Other | Admitting: Critical Care Medicine

## 2011-06-20 ENCOUNTER — Encounter: Payer: Self-pay | Admitting: Critical Care Medicine

## 2011-06-20 ENCOUNTER — Ambulatory Visit (INDEPENDENT_AMBULATORY_CARE_PROVIDER_SITE_OTHER)
Admission: RE | Admit: 2011-06-20 | Discharge: 2011-06-20 | Disposition: A | Discharge status: Home / Self Care | Payer: Medicare Other | Source: Ambulatory Visit | Attending: Critical Care Medicine | Admitting: Critical Care Medicine

## 2011-06-20 VITALS — BP 116/68 | HR 77 | Temp 98.4°F | Ht 73.0 in | Wt 179.8 lb

## 2011-06-20 DIAGNOSIS — J8410 Pulmonary fibrosis, unspecified: Secondary | ICD-10-CM

## 2011-06-20 DIAGNOSIS — J449 Chronic obstructive pulmonary disease, unspecified: Secondary | ICD-10-CM

## 2011-06-20 MED ORDER — PREDNISONE 10 MG PO TABS: ORAL_TABLET | ORAL | Status: DC

## 2011-06-20 MED ORDER — AZITHROMYCIN 250 MG PO TABS: 250.0000 mg | ORAL_TABLET | Freq: Every day | ORAL | Status: AC

## 2011-06-20 NOTE — Patient Instructions (Addendum)
Prednisone 10mg  Take 4 for three days 3 for three days 2 for three days 1 for three days and stop Azithromycin 250mg  Take two once then one daily until gone Chest xray today Change oxygen to: 4L rest   6L continuous with exertion  Return 1 month

## 2011-06-20 NOTE — Progress Notes (Signed)
Quick Note:  Notify the patient that the Xray is stable and no pneumonia, No new changes No change in medications are recommended. Continue current meds as prescribed at last office visit ______

## 2011-06-20 NOTE — Assessment & Plan Note (Signed)
Chronic obstructive lung disease with associated pulmonary fibrosis now with mild flare PlanPrednisone 10mg  Take 4 for three days 3 for three days 2 for three days 1 for three days and stop Azithromycin 250mg  Take two once then one daily until gone Change oxygen to: 4L rest   6L continuous with exertion  Return 1 month

## 2011-06-20 NOTE — Progress Notes (Signed)
Subjective:    Patient ID: Evan [name], male    DOB: 1931-04-22, 78 y.o.   MRN: [id number]  HPI  78 y.o.   wm quit smoker around 1980 history of chronic obstructive airways disease exacerbated by chronic rheumatoid arthritis, associated pulmonary fibrosis, on 02 since 2008  ation in symptoms with  weather or environmental change or other alleviating or aggravating factors  12/5 Feels worse since , takes less to get dyspneic.  No more cough.  No chest pain.  No edema in feet. Notes mild heartburn.  More fatigue. Pt denies any significant sore throat, nasal congestion or excess secretions, fever, chills, sweats, unintended weight loss, pleurtic or exertional chest pain, orthopnea PND, or leg swelling Pt denies any increase in rescue therapy over baseline, denies waking up needing it or having any early am or nocturnal exacerbations of coughing/wheezing/or dyspnea. Pt also denies any obvious fluctuation in symptoms with  weather or environmental change or other alleviating or aggravating factors  06/20/2011 Gradually worse over 4 months. More cough with yellow mucus.  More dyspneic with exertion Notes more chest pain.   No real wheeze.  No edema in feet.  No f/c/s. Pt denies any significant sore throat, nasal congestion or , fever, chills, sweats, unintended weight loss, pleurtic or exertional chest pain, orthopnea PND, or leg swelling Pt denies any increase in rescue therapy over baseline, denies waking up needing it or having any early am or nocturnal exacerbations of coughing/wheezing/or dyspnea. Pt also denies any obvious fluctuation in symptoms with  weather or environmental change or other alleviating or aggravating factors  Past Medical History  Diagnosis Date  . Coronary atherosclerosis of unspecified type of vessel, native or graft   . Rheumatoid arthritis   . Other emphysema   . Postinflammatory pulmonary fibrosis   . Acute bronchitis   . Chronic airway obstruction, not  elsewhere classified      Family History  Problem Relation Age of Onset  . Breast cancer Sister   . Colon cancer Father   . Pulmonary fibrosis Brother      History   Social History  . Marital Status: Married    Spouse Name: N/A    Number of Children: N/A  . Years of Education: N/A   Occupational History  . retired Medical illustrator    Social History Main Topics  . Smoking status: Former Smoker -- 2.0 packs/day for 40 years    Quit date: 01/23/1992  . Smokeless tobacco: Never Used  . Alcohol Use: Yes     rare ETOH use  . Drug Use: No  . Sexually Active: Not on file   Other Topics Concern  . Not on file   Social History Narrative  . No narrative on file     No Known Allergies   Outpatient Prescriptions Prior to Visit  Medication Sig Dispense Refill  . aspirin 81 MG tablet Take 81 mg by mouth daily.        [name] Kitchen azaTHIOprine (IMURAN) 50 MG tablet TAKE 4 TABLETS BY MOUTH DAILY AS DIRECTED  120 tablet  5  . beclomethasone (QVAR) 80 MCG/ACT inhaler Inhale 2 puffs into the lungs 2 (two) times daily.  1 Inhaler  4  . Calcium Carbonate (CALCIUM 600) 1500 MG TABS Take 1 tablet by mouth daily.       [name] Kitchen ezetimibe (ZETIA) 10 MG tablet Take 10 mg by mouth daily.        . famotidine (PEPCID AC) 10 MG chewable tablet Chew  10 mg by mouth daily.        . irbesartan (AVAPRO) 150 MG tablet Take 150 mg by mouth at bedtime.        . metoprolol (TOPROL-XL) 50 MG 24 hr tablet Take 50 mg by mouth daily.        [name] Kitchen NEXIUM 40 MG capsule TAKE 1 CAPSULE BY MOUTH ONCE DAILY  30 capsule  6  . simvastatin (ZOCOR) 40 MG tablet Take 40 mg by mouth at bedtime.        [name] Kitchen SPIRIVA HANDIHALER 18 MCG inhalation capsule INHALE CONTENTS OF ONE CAPSULE ONCE DAILY  30 capsule  4  . temazepam (RESTORIL) 15 MG capsule Take 15 mg by mouth at bedtime as needed.             Review of Systems  Constitutional:   No  weight loss, night sweats,  Fevers, chills, fatigue, lassitude. HEENT:   No headaches,  Difficulty  swallowing,  Tooth/dental problems,  Sore throat,                No sneezing, itching, ear ache, nasal congestion, post nasal drip,   CV:  No chest pain,  Orthopnea, PND, swelling in lower extremities, anasarca, dizziness, palpitations  GI  No heartburn, indigestion, abdominal pain, nausea, vomiting, diarrhea, change in bowel habits, loss of appetite  Resp: Notes  shortness of breath with exertion not at rest.  No excess mucus, notes  productive cough,  No non-productive cough,  No coughing up of blood.  No change in color of mucus.  Notes  wheezing.  No chest wall deformity  Skin: no rash or lesions.  GU: no dysuria, change in color of urine, no urgency or frequency.  No flank pain.  MS:  No joint pain or swelling.  No decreased range of motion.  No back pain.  Psych:  No change in mood or affect. No depression or anxiety.  No memory loss.     Objective:   Physical Exam  Filed Vitals:   06/20/11 0942 06/20/11 0947  BP:  116/68  Pulse:  77  Temp:  98.4 F (36.9 C)  TempSrc:  Oral  Height:  6\' 1"  (1.854 m)  Weight:  179 lb 12.8 oz (81.557 kg)  SpO2: 84% 91%    Gen: Pleasant, well-nourished, in no distress,  normal affect  ENT: No lesions,  mouth clear,  oropharynx clear, no postnasal drip  Neck: No JVD, no TMG, no carotid bruits  Lungs: No use of accessory muscles, no dullness to percussion,rales dry  Cardiovascular: RRR, heart sounds normal, no murmur or gallops, no peripheral edema  Abdomen: soft and NT, no HSM,  BS normal  Musculoskeletal: No deformities, no cyanosis or clubbing  Neuro: alert, non focal  Skin: Warm, no lesions or rashes   Dg Chest 2 View  06/20/2011  *RADIOLOGY REPORT*  Clinical Data: Pulmonary fibrosis.  CHEST - 2 VIEW  Comparison: 08/25/2010  Findings: There is been no appreciable change in the extensive chronic interstitial and obstructive lung disease bilaterally.  No new abnormalities.  Heart size and vascularity are within normal limits.   IMPRESSION: Stable appearance of the chronic interstitial and obstructive lung disease.  Original Report Authenticated By: Gwynn Burly, M.D.        Assessment & Plan:   COPD Chronic obstructive lung disease with associated pulmonary fibrosis now with mild flare PlanPrednisone 10mg  Take 4 for three days 3 for three days 2 for three days 1 for  three days and stop Azithromycin 250mg  Take two once then one daily until gone Change oxygen to: 4L rest   6L continuous with exertion  Return 1 month      Updated Medication List Outpatient Encounter Prescriptions as of 06/20/2011  Medication Sig Dispense Refill  . aspirin 81 MG tablet Take 81 mg by mouth daily.        [name] Kitchen azaTHIOprine (IMURAN) 50 MG tablet TAKE 4 TABLETS BY MOUTH DAILY AS DIRECTED  120 tablet  5  . beclomethasone (QVAR) 80 MCG/ACT inhaler Inhale 2 puffs into the lungs 2 (two) times daily.  1 Inhaler  4  . Calcium Carbonate (CALCIUM 600) 1500 MG TABS Take 1 tablet by mouth daily.       [name] Kitchen ezetimibe (ZETIA) 10 MG tablet Take 10 mg by mouth daily.        . famotidine (PEPCID AC) 10 MG chewable tablet Chew 10 mg by mouth daily.        . irbesartan (AVAPRO) 150 MG tablet Take 150 mg by mouth at bedtime.        . metoprolol (TOPROL-XL) 50 MG 24 hr tablet Take 50 mg by mouth daily.        [name] Kitchen NEXIUM 40 MG capsule TAKE 1 CAPSULE BY MOUTH ONCE DAILY  30 capsule  6  . risedronate (ACTONEL) 35 MG tablet Take 35 mg by mouth daily. with water on empty stomach, nothing by mouth or lie down for next 30 minutes.      . simvastatin (ZOCOR) 40 MG tablet Take 40 mg by mouth at bedtime.        [name] Kitchen SPIRIVA HANDIHALER 18 MCG inhalation capsule INHALE CONTENTS OF ONE CAPSULE ONCE DAILY  30 capsule  4  . temazepam (RESTORIL) 15 MG capsule Take 15 mg by mouth at bedtime as needed.        . zolpidem (AMBIEN) 5 MG tablet Take 1 tablet by mouth At bedtime.      [name] Kitchen azithromycin (ZITHROMAX) 250 MG tablet Take 1 tablet (250 mg total) by mouth daily. Take two  once then one daily until gone  6 each  0  . predniSONE (DELTASONE) 10 MG tablet Take 4 for three days 3 for three days 2 for three days 1 for three days and stop  30 tablet  0

## 2011-06-21 ENCOUNTER — Telehealth: Payer: Self-pay | Admitting: Critical Care Medicine

## 2011-06-21 NOTE — Progress Notes (Signed)
Quick Note:  Called, spoke with pt's wife, Evan [name]. She will have him call office back. ______

## 2011-06-21 NOTE — Telephone Encounter (Signed)
Notes Recorded by Storm Frisk, MD on 06/20/2011 at 5:30 PM Notify the patient that the Xray is stable and no pneumonia, No new changes No change in medications are recommended. Continue current meds as prescribed at last office visit  I spoke with patient about results and he verbalized understanding and had no questions

## 2011-07-31 ENCOUNTER — Other Ambulatory Visit (INDEPENDENT_AMBULATORY_CARE_PROVIDER_SITE_OTHER): Payer: Medicare Other

## 2011-07-31 ENCOUNTER — Ambulatory Visit (INDEPENDENT_AMBULATORY_CARE_PROVIDER_SITE_OTHER): Payer: Medicare Other | Admitting: Critical Care Medicine

## 2011-07-31 ENCOUNTER — Encounter: Payer: Self-pay | Admitting: Critical Care Medicine

## 2011-07-31 VITALS — BP 128/76 | HR 96 | Temp 97.6°F | Ht 73.0 in | Wt 179.4 lb

## 2011-07-31 DIAGNOSIS — J209 Acute bronchitis, unspecified: Secondary | ICD-10-CM

## 2011-07-31 DIAGNOSIS — J8410 Pulmonary fibrosis, unspecified: Secondary | ICD-10-CM

## 2011-07-31 LAB — HEPATIC FUNCTION PANEL
ALT: 14 U/L (ref 0–53)
AST: 20 U/L (ref 0–37)
Alkaline Phosphatase: 93 U/L (ref 39–117)
Total Bilirubin: 0.7 mg/dL (ref 0.3–1.2)

## 2011-07-31 LAB — CBC WITH DIFFERENTIAL/PLATELET
Basophils Absolute: 0 10*3/uL (ref 0.0–0.1)
Eosinophils Absolute: 0.1 10*3/uL (ref 0.0–0.7)
Eosinophils Relative: 2.7 % (ref 0.0–5.0)
HCT: 31.2 % — ABNORMAL LOW (ref 39.0–52.0)
Lymphs Abs: 0.7 10*3/uL (ref 0.7–4.0)
MCV: 107.7 fl — ABNORMAL HIGH (ref 78.0–100.0)
Monocytes Absolute: 0.5 10*3/uL (ref 0.1–1.0)
Neutrophils Relative %: 70.8 % (ref 43.0–77.0)
Platelets: 211 10*3/uL (ref 150.0–400.0)
RDW: 15.6 % — ABNORMAL HIGH (ref 11.5–14.6)
WBC: 4.5 10*3/uL (ref 4.5–10.5)

## 2011-07-31 MED ORDER — PREDNISONE 10 MG PO TABS: ORAL_TABLET | ORAL | Status: DC

## 2011-07-31 MED ORDER — AZITHROMYCIN 250 MG PO TABS: 250.0000 mg | ORAL_TABLET | Freq: Every day | ORAL | Status: AC

## 2011-07-31 NOTE — Patient Instructions (Addendum)
Prednisone 10mg  Take 4 tablets daily for 5 days then stop Azithromycin 250mg  Take two once then one daily until gone A CT Chest will be obtained.  None since 2010 Labs today Based upon labs and CT Chest will decide on stopping azathioprine (imuran) Return 2 months

## 2011-07-31 NOTE — Progress Notes (Signed)
Subjective:    Patient ID: Evan [name], male    DOB: Jan 06, 1932, 78 y.o.   MRN: [id number]  HPI  78 y.o.   wm quit smoker around 1980 history of chronic obstructive airways disease exacerbated by chronic rheumatoid arthritis, associated pulmonary fibrosis, on 02 since 2008  ation in symptoms with  weather or environmental change or other alleviating or aggravating factors  07/31/2011 Pt has new Cardiology MD.  He indicated no more Zetia.  ?d/c of imuran.  10days of cough and thick yellow mucus. Mucus is hard to raise.  Pt notes no chest pain. Pt is more dyspneic.   Azithromycin and pred helped and now is worse No edema in feet   Past Medical History  Diagnosis Date  . Coronary atherosclerosis of unspecified type of vessel, native or graft   . Rheumatoid arthritis   . Other emphysema   . Postinflammatory pulmonary fibrosis   . Acute bronchitis   . Chronic airway obstruction, not elsewhere classified      Family History  Problem Relation Age of Onset  . Breast cancer Sister   . Colon cancer Father   . Pulmonary fibrosis Brother      History   Social History  . Marital Status: Married    Spouse Name: N/A    Number of Children: N/A  . Years of Education: N/A   Occupational History  . retired Medical illustrator    Social History Main Topics  . Smoking status: Former Smoker -- 2.0 packs/day for 40 years    Types: Cigarettes    Quit date: 01/22/1985  . Smokeless tobacco: Never Used  . Alcohol Use: Yes     rare ETOH use  . Drug Use: No  . Sexually Active: Not on file   Other Topics Concern  . Not on file   Social History Narrative  . No narrative on file     No Known Allergies   Outpatient Prescriptions Prior to Visit  Medication Sig Dispense Refill  . aspirin 81 MG tablet Take 81 mg by mouth daily.        [name] Kitchen azaTHIOprine (IMURAN) 50 MG tablet TAKE 4 TABLETS BY MOUTH DAILY AS DIRECTED  120 tablet  5  . beclomethasone (QVAR) 80 MCG/ACT inhaler Inhale 2 puffs into the  lungs 2 (two) times daily.  1 Inhaler  4  . Calcium Carbonate (CALCIUM 600) 1500 MG TABS Take 1 tablet by mouth daily.       . famotidine (PEPCID AC) 10 MG chewable tablet Chew 10 mg by mouth daily.        . irbesartan (AVAPRO) 150 MG tablet Take 150 mg by mouth at bedtime.        . metoprolol (TOPROL-XL) 50 MG 24 hr tablet Take 50 mg by mouth daily.        [name] Kitchen NEXIUM 40 MG capsule TAKE 1 CAPSULE BY MOUTH ONCE DAILY  30 capsule  6  . risedronate (ACTONEL) 35 MG tablet Take 35 mg by mouth daily. with water on empty stomach, nothing by mouth or lie down for next 30 minutes.      . simvastatin (ZOCOR) 40 MG tablet Take 40 mg by mouth at bedtime.        [name] Kitchen SPIRIVA HANDIHALER 18 MCG inhalation capsule INHALE CONTENTS OF ONE CAPSULE ONCE DAILY  30 capsule  4  . temazepam (RESTORIL) 15 MG capsule Take 15 mg by mouth at bedtime as needed.        [name] Kitchen  zolpidem (AMBIEN) 5 MG tablet Take 1 tablet by mouth At bedtime.      [name] Kitchen ezetimibe (ZETIA) 10 MG tablet Take 10 mg by mouth daily.        . predniSONE (DELTASONE) 10 MG tablet Take 4 for three days 3 for three days 2 for three days 1 for three days and stop  30 tablet  0       Review of Systems  Constitutional:   No  weight loss, night sweats,  Fevers, chills, fatigue, lassitude. HEENT:   No headaches,  Difficulty swallowing,  Tooth/dental problems,  Sore throat,                No sneezing, itching, ear ache, nasal congestion, post nasal drip,   CV:  No chest pain,  Orthopnea, PND, swelling in lower extremities, anasarca, dizziness, palpitations  GI  No heartburn, indigestion, abdominal pain, nausea, vomiting, diarrhea, change in bowel habits, loss of appetite  Resp: Notes  shortness of breath with exertion not at rest.  No excess mucus, notes  productive cough,  No non-productive cough,  No coughing up of blood.  No change in color of mucus.  Notes  wheezing.  No chest wall deformity  Skin: no rash or lesions.  GU: no dysuria, change in color of urine,  no urgency or frequency.  No flank pain.  MS:  No joint pain or swelling.  No decreased range of motion.  No back pain.  Psych:  No change in mood or affect. No depression or anxiety.  No memory loss.     Objective:   Physical Exam  Filed Vitals:   07/31/11 1613 07/31/11 1615  BP:  128/76  Pulse:  96  Temp:  97.6 F (36.4 C)  TempSrc:  Oral  Height:  6\' 1"  (1.854 m)  Weight:  179 lb 6.4 oz (81.375 kg)  SpO2: 83% 90%    Gen: Pleasant, well-nourished, in no distress,  normal affect  ENT: No lesions,  mouth clear,  oropharynx clear, no postnasal drip  Neck: No JVD, no TMG, no carotid bruits  Lungs: No use of accessory muscles, no dullness to percussion,rales dry  Cardiovascular: RRR, heart sounds normal, no murmur or gallops, no peripheral edema  Abdomen: soft and NT, no HSM,  BS normal  Musculoskeletal: No deformities, no cyanosis or clubbing  Neuro: alert, non focal  Skin: Warm, no lesions or rashes   No results found. Hepatic Function Panel     Component Value Date/Time   PROT 6.9 07/31/2011 1646   PROT 6.9 10/25/2008 1352   ALBUMIN 3.8 07/31/2011 1646   AST 20 07/31/2011 1646   AST 24 10/25/2008 1352   ALT 14 07/31/2011 1646   ALKPHOS 93 07/31/2011 1646   ALKPHOS 67 10/25/2008 1352   BILITOT 0.7 07/31/2011 1646   BILITOT 0.50 10/25/2008 1352   BILIDIR 0.1 07/31/2011 1646     CBC:    Component Value Date/Time   WBC 4.5 07/31/2011 1646   WBC 5.1 11/22/2008 1113   HGB 10.6* 07/31/2011 1646   HGB 11.8* 11/22/2008 1113   HCT 31.2* 07/31/2011 1646   HCT 34.4* 11/22/2008 1113   PLT 211.0 07/31/2011 1646   PLT 195 11/22/2008 1113   MCV 107.7* 07/31/2011 1646   MCV 105* 11/22/2008 1113   NEUTROABS 3.2 07/31/2011 1646   NEUTROABS 3.7 11/22/2008 1113   LYMPHSABS 0.7 07/31/2011 1646   LYMPHSABS 0.7* 11/22/2008 1113   MONOABS 0.5 07/31/2011 1646   EOSABS  0.1 07/31/2011 1646   EOSABS 0.2 11/22/2008 1113   BASOSABS 0.0 07/31/2011 1646   BASOSABS 0.0 11/22/2008 1113          Assessment &  Plan:   PULMONARY FIBROSIS Autoimmune pulmonary fibrosis PFTs 08/2010:  [facility]/ [facility]  49%  TLC 75%    At this point the patient is developing side effects from Imuran and need to assess his ongoing need The patient also has acute tracheobronchitis Plan Prednisone 10mg  Take 4 tablets daily for 5 days then stop Azithromycin 250mg  Take two once then one daily until gone A CT Chest will be obtained.  None since 2010 Labs today  All normal (liver and cbc) Based upon labs and CT Chest will decide on stopping azathioprine (imuran) Return 2 months      Updated Medication List Outpatient Encounter Prescriptions as of 07/31/2011  Medication Sig Dispense Refill  . aspirin 81 MG tablet Take 81 mg by mouth daily.        [name] Kitchen azaTHIOprine (IMURAN) 50 MG tablet TAKE 4 TABLETS BY MOUTH DAILY AS DIRECTED  120 tablet  5  . beclomethasone (QVAR) 80 MCG/ACT inhaler Inhale 2 puffs into the lungs 2 (two) times daily.  1 Inhaler  4  . Calcium Carbonate (CALCIUM 600) 1500 MG TABS Take 1 tablet by mouth daily.       . famotidine (PEPCID AC) 10 MG chewable tablet Chew 10 mg by mouth daily.        . irbesartan (AVAPRO) 150 MG tablet Take 150 mg by mouth at bedtime.        . metoprolol (TOPROL-XL) 50 MG 24 hr tablet Take 50 mg by mouth daily.        [name] Kitchen NEXIUM 40 MG capsule TAKE 1 CAPSULE BY MOUTH ONCE DAILY  30 capsule  6  . risedronate (ACTONEL) 35 MG tablet Take 35 mg by mouth daily. with water on empty stomach, nothing by mouth or lie down for next 30 minutes.      . simvastatin (ZOCOR) 40 MG tablet Take 40 mg by mouth at bedtime.        [name] Kitchen SPIRIVA HANDIHALER 18 MCG inhalation capsule INHALE CONTENTS OF ONE CAPSULE ONCE DAILY  30 capsule  4  . temazepam (RESTORIL) 15 MG capsule Take 15 mg by mouth at bedtime as needed.        . zolpidem (AMBIEN) 5 MG tablet Take 1 tablet by mouth At bedtime.      [name] Kitchen azithromycin (ZITHROMAX) 250 MG tablet Take 1 tablet (250 mg total) by mouth daily. Take two once then one daily until  gone  6 each  0  . predniSONE (DELTASONE) 10 MG tablet Take 4 tablets daily for 5 days then stop  20 tablet  0  . DISCONTD: ezetimibe (ZETIA) 10 MG tablet Take 10 mg by mouth daily.        [name] Kitchen DISCONTD: predniSONE (DELTASONE) 10 MG tablet Take 4 for three days 3 for three days 2 for three days 1 for three days and stop  30 tablet  0

## 2011-08-01 ENCOUNTER — Telehealth: Payer: Self-pay | Admitting: *Deleted

## 2011-08-01 NOTE — Telephone Encounter (Signed)
Message copied by Gweneth Dimitri D on Wed Aug 01, 2011  5:21 PM ------      Message from: Shan Levans E      Created: Wed Aug 01, 2011  5:17 PM       Call mr Sear and tell him all labs ok and normal            pw

## 2011-08-01 NOTE — Telephone Encounter (Signed)
Called, spoke with pt.  I informed him labs are ok per Dr. Delford Field.  He verbalized understanding of this and voiced no further questions/concerns at this time.

## 2011-08-01 NOTE — Assessment & Plan Note (Addendum)
Autoimmune pulmonary fibrosis PFTs 08/2010:  [facility]/ [facility]  49%  TLC 75%    At this point the patient is developing side effects from Imuran and need to assess his ongoing need The patient also has acute tracheobronchitis Plan Prednisone 10mg  Take 4 tablets daily for 5 days then stop Azithromycin 250mg  Take two once then one daily until gone A CT Chest will be obtained.  None since 2010 Labs today Based upon labs and CT Chest will decide on stopping azathioprine (imuran) Return 2 months

## 2011-08-08 ENCOUNTER — Ambulatory Visit (INDEPENDENT_AMBULATORY_CARE_PROVIDER_SITE_OTHER)
Admission: RE | Admit: 2011-08-08 | Discharge: 2011-08-08 | Disposition: A | Discharge status: Home / Self Care | Payer: Medicare Other | Source: Ambulatory Visit | Attending: Critical Care Medicine | Admitting: Critical Care Medicine

## 2011-08-08 DIAGNOSIS — J8410 Pulmonary fibrosis, unspecified: Secondary | ICD-10-CM

## 2011-08-29 ENCOUNTER — Other Ambulatory Visit: Payer: Self-pay | Admitting: Adult Health

## 2011-08-29 ENCOUNTER — Other Ambulatory Visit: Payer: Self-pay | Admitting: Critical Care Medicine

## 2011-09-12 ENCOUNTER — Ambulatory Visit (INDEPENDENT_AMBULATORY_CARE_PROVIDER_SITE_OTHER): Payer: Medicare Other | Admitting: Critical Care Medicine

## 2011-09-12 ENCOUNTER — Telehealth: Payer: Self-pay | Admitting: Critical Care Medicine

## 2011-09-12 ENCOUNTER — Encounter: Payer: Self-pay | Admitting: Critical Care Medicine

## 2011-09-12 VITALS — BP 118/80 | HR 91 | Temp 97.5°F | Ht 73.0 in | Wt 178.2 lb

## 2011-09-12 DIAGNOSIS — J449 Chronic obstructive pulmonary disease, unspecified: Secondary | ICD-10-CM

## 2011-09-12 NOTE — Telephone Encounter (Signed)
noted 

## 2011-09-12 NOTE — Telephone Encounter (Signed)
Noted by triage.  Will sign and forward to PW as FYI.

## 2011-09-12 NOTE — Progress Notes (Signed)
Subjective:    Patient ID: Evan [name], male    DOB: 07-17-1931, 78 y.o.   MRN: [id number]  HPI  78 y.o.   wm quit smoker around 1980 history of chronic obstructive airways disease exacerbated by chronic rheumatoid arthritis, associated pulmonary fibrosis, on 02 since 2008  ation in symptoms with  weather or environmental change or other alleviating or aggravating factors  07/31/2011 Pt has new Cardiology MD.  He indicated no more Zetia.  ?d/c of imuran.  10days of cough and thick yellow mucus. Mucus is hard to raise.  Pt notes no chest pain. Pt is more dyspneic.   Azithromycin and pred helped and now is worse No edema in feet  09/12/2011 Pt may be ? Worse.  Still with yellow mucus to sl less from last ov.  No chest pain .  No dyspnea at rest just with minimal exertion.   RA issues is worse .  L shoulder is an issue.   Past Medical History  Diagnosis Date  . Coronary atherosclerosis of unspecified type of vessel, native or graft   . Rheumatoid arthritis   . Other emphysema   . Postinflammatory pulmonary fibrosis   . Acute bronchitis   . Chronic airway obstruction, not elsewhere classified      Family History  Problem Relation Age of Onset  . Breast cancer Sister   . Colon cancer Father   . Pulmonary fibrosis Brother      History   Social History  . Marital Status: Married    Spouse Name: N/A    Number of Children: N/A  . Years of Education: N/A   Occupational History  . retired Medical illustrator    Social History Main Topics  . Smoking status: Former Smoker -- 2.0 packs/day for 40 years    Types: Cigarettes    Quit date: 01/22/1985  . Smokeless tobacco: Never Used  . Alcohol Use: Yes     rare ETOH use  . Drug Use: No  . Sexually Active: Not on file   Other Topics Concern  . Not on file   Social History Narrative  . No narrative on file     No Known Allergies   Outpatient Prescriptions Prior to Visit  Medication Sig Dispense Refill  . aspirin 81 MG tablet  Take 81 mg by mouth daily.        . Calcium Carbonate (CALCIUM 600) 1500 MG TABS Take 1 tablet by mouth daily.       . metoprolol (TOPROL-XL) 50 MG 24 hr tablet Take 50 mg by mouth daily.        [name] Kitchen NEXIUM 40 MG capsule TAKE 1 CAPSULE BY MOUTH ONCE DAILY  30 capsule  5  . QVAR 80 MCG/ACT inhaler INHALE 2 PUFFS INTO THE LUNGS 2 (TWO) TIMES DAILY.  8.7 g  3  . simvastatin (ZOCOR) 40 MG tablet Take 40 mg by mouth at bedtime.        [name] Kitchen SPIRIVA HANDIHALER 18 MCG inhalation capsule INHALE CONTENTS OF ONE CAPSULE ONCE DAILY  30 capsule  4  . temazepam (RESTORIL) 15 MG capsule Take 15 mg by mouth at bedtime as needed.        . zolpidem (AMBIEN) 5 MG tablet Take 1 tablet by mouth At bedtime.      [name] Kitchen azaTHIOprine (IMURAN) 50 MG tablet TAKE 4 TABLETS BY MOUTH DAILY AS DIRECTED  120 tablet  5  . famotidine (PEPCID AC) 10 MG chewable tablet Chew 10 mg by  mouth daily.        . irbesartan (AVAPRO) 150 MG tablet Take 150 mg by mouth at bedtime.        . predniSONE (DELTASONE) 10 MG tablet Take 4 tablets daily for 5 days then stop  20 tablet  0  . risedronate (ACTONEL) 35 MG tablet Take 35 mg by mouth daily. with water on empty stomach, nothing by mouth or lie down for next 30 minutes.           Review of Systems  Constitutional:   No  weight loss, night sweats,  Fevers, chills, fatigue, lassitude. HEENT:   No headaches,  Difficulty swallowing,  Tooth/dental problems,  Sore throat,                No sneezing, itching, ear ache, nasal congestion, post nasal drip,   CV:  No chest pain,  Orthopnea, PND, swelling in lower extremities, anasarca, dizziness, palpitations  GI  No heartburn, indigestion, abdominal pain, nausea, vomiting, diarrhea, change in bowel habits, loss of appetite  Resp: Notes  shortness of breath with exertion not at rest.  No excess mucus, notes  productive cough,  No non-productive cough,  No coughing up of blood.  No change in color of mucus.  Notes  wheezing.  No chest wall  deformity  Skin: no rash or lesions.  GU: no dysuria, change in color of urine, no urgency or frequency.  No flank pain.  MS:  No joint pain or swelling.  No decreased range of motion.  No back pain.  Psych:  No change in mood or affect. No depression or anxiety.  No memory loss.     Objective:   Physical Exam  Filed Vitals:   09/12/11 1502 09/12/11 1511  BP:  118/80  Pulse:  91  Temp:  97.5 F (36.4 C)  TempSrc:  Oral  Height:  6\' 1"  (1.854 m)  Weight:  178 lb 3.2 oz (80.831 kg)  SpO2: 79% 90%    Gen: Pleasant, well-nourished, in no distress,  normal affect  ENT: No lesions,  mouth clear,  oropharynx clear, no postnasal drip  Neck: No JVD, no TMG, no carotid bruits  Lungs: No use of accessory muscles, no dullness to percussion,rales dry  Cardiovascular: RRR, heart sounds normal, no murmur or gallops, no peripheral edema  Abdomen: soft and NT, no HSM,  BS normal  Musculoskeletal: No deformities, no cyanosis or clubbing  Neuro: alert, non focal  Skin: Warm, no lesions or rashes   No results found. Hepatic Function Panel     Component Value Date/Time   PROT 6.9 07/31/2011 1646   PROT 6.9 10/25/2008 1352   ALBUMIN 3.8 07/31/2011 1646   AST 20 07/31/2011 1646   AST 24 10/25/2008 1352   ALT 14 07/31/2011 1646   ALKPHOS 93 07/31/2011 1646   ALKPHOS 67 10/25/2008 1352   BILITOT 0.7 07/31/2011 1646   BILITOT 0.50 10/25/2008 1352   BILIDIR 0.1 07/31/2011 1646     CBC:    Component Value Date/Time   WBC 4.5 07/31/2011 1646   WBC 5.1 11/22/2008 1113   HGB 10.6* 07/31/2011 1646   HGB 11.8* 11/22/2008 1113   HCT 31.2* 07/31/2011 1646   HCT 34.4* 11/22/2008 1113   PLT 211.0 07/31/2011 1646   PLT 195 11/22/2008 1113   MCV 107.7* 07/31/2011 1646   MCV 105* 11/22/2008 1113   NEUTROABS 3.2 07/31/2011 1646   NEUTROABS 3.7 11/22/2008 1113   LYMPHSABS 0.7 07/31/2011 1646  LYMPHSABS 0.7* 11/22/2008 1113   MONOABS 0.5 07/31/2011 1646   EOSABS 0.1 07/31/2011 1646   EOSABS 0.2 11/22/2008 1113    BASOSABS 0.0 07/31/2011 1646   BASOSABS 0.0 11/22/2008 1113          Assessment & Plan:   COPD Chronic obstructive lung disease with associated pulmonary fibrotic changes gold stage D. oxygen dependent Plan Maintain inhaled medications as prescribed Maintain oxygen as prescribed Discontinue further Imuran and prednisone   Updated Medication List Outpatient Encounter Prescriptions as of 09/12/2011  Medication Sig Dispense Refill  . aspirin 81 MG tablet Take 81 mg by mouth daily.        . Calcium Carbonate (CALCIUM 600) 1500 MG TABS Take 1 tablet by mouth daily.       [name] Kitchen losartan (COZAAR) 100 MG tablet Take 1 tablet by mouth daily.      . meloxicam (MOBIC) 15 MG tablet Take 1 tablet by mouth daily.      . metoprolol (TOPROL-XL) 50 MG 24 hr tablet Take 50 mg by mouth daily.        . mirtazapine (REMERON) 30 MG tablet Take 1 tablet by mouth at bedtime.      [name] Kitchen NEXIUM 40 MG capsule TAKE 1 CAPSULE BY MOUTH ONCE DAILY  30 capsule  5  . QVAR 80 MCG/ACT inhaler INHALE 2 PUFFS INTO THE LUNGS 2 (TWO) TIMES DAILY.  8.7 g  3  . simvastatin (ZOCOR) 40 MG tablet Take 40 mg by mouth at bedtime.        [name] Kitchen SPIRIVA HANDIHALER 18 MCG inhalation capsule INHALE CONTENTS OF ONE CAPSULE ONCE DAILY  30 capsule  4  . temazepam (RESTORIL) 15 MG capsule Take 15 mg by mouth at bedtime as needed.        . zolpidem (AMBIEN) 5 MG tablet Take 1 tablet by mouth At bedtime.      [name] Kitchen DISCONTD: azaTHIOprine (IMURAN) 50 MG tablet TAKE 4 TABLETS BY MOUTH DAILY AS DIRECTED  120 tablet  5  . DISCONTD: famotidine (PEPCID AC) 10 MG chewable tablet Chew 10 mg by mouth daily.        [name] Kitchen DISCONTD: irbesartan (AVAPRO) 150 MG tablet Take 150 mg by mouth at bedtime.        [name] Kitchen DISCONTD: predniSONE (DELTASONE) 10 MG tablet Take 4 tablets daily for 5 days then stop  20 tablet  0  . DISCONTD: risedronate (ACTONEL) 35 MG tablet Take 35 mg by mouth daily. with water on empty stomach, nothing by mouth or lie down for next 30 minutes.

## 2011-09-12 NOTE — Patient Instructions (Addendum)
Please call us with the name of the rheumatologist you see so I can communicate with him/her. No changes in medications for now, I may make changes based upon the rheumatology exam Return 2 months

## 2011-09-13 NOTE — Assessment & Plan Note (Signed)
Chronic obstructive lung disease with associated pulmonary fibrotic changes gold stage D. oxygen dependent Plan Maintain inhaled medications as prescribed Maintain oxygen as prescribed Discontinue further Imuran and prednisone

## 2011-09-14 ENCOUNTER — Telehealth: Payer: Self-pay | Admitting: Critical Care Medicine

## 2011-09-14 NOTE — Telephone Encounter (Signed)
FYI for PW.  Pt stated that  Dr. Madelon Lips will contact PW today about the pt.  Nothing further is needed.

## 2011-09-30 ENCOUNTER — Other Ambulatory Visit: Payer: Self-pay | Admitting: Critical Care Medicine

## 2011-11-02 ENCOUNTER — Telehealth: Payer: Self-pay | Admitting: Critical Care Medicine

## 2011-11-02 NOTE — Telephone Encounter (Signed)
Will forward to Dr. Delford Field. Please advise thanks

## 2011-11-05 NOTE — Telephone Encounter (Signed)
I spoke to the Dr and she is going to Rx cellcept for confirmed RA

## 2011-11-05 NOTE — Telephone Encounter (Signed)
I will call her.

## 2011-11-07 ENCOUNTER — Encounter: Payer: Self-pay | Admitting: Critical Care Medicine

## 2011-11-07 ENCOUNTER — Ambulatory Visit (INDEPENDENT_AMBULATORY_CARE_PROVIDER_SITE_OTHER): Payer: Medicare Other | Admitting: Critical Care Medicine

## 2011-11-07 VITALS — BP 122/80 | HR 99 | Temp 97.7°F | Ht 73.0 in | Wt 185.0 lb

## 2011-11-07 DIAGNOSIS — J8410 Pulmonary fibrosis, unspecified: Secondary | ICD-10-CM

## 2011-11-07 DIAGNOSIS — J449 Chronic obstructive pulmonary disease, unspecified: Secondary | ICD-10-CM

## 2011-11-07 DIAGNOSIS — Z23 Encounter for immunization: Secondary | ICD-10-CM

## 2011-11-07 DIAGNOSIS — J4489 Other specified chronic obstructive pulmonary disease: Secondary | ICD-10-CM

## 2011-11-07 NOTE — Assessment & Plan Note (Signed)
Chronic obstructive lung disease with a pulmonary fibrotic component and emphysematous components Chronic hypoxemic respiratory failure oxygen dependent Plan Maintain inhaled medications as prescribed Maintain oxygen therapy

## 2011-11-07 NOTE — Assessment & Plan Note (Signed)
Autoimmune pulmonary fibrosis Rheumatoid arthritis confirmed by rheumatology Plan Per rheumatology

## 2011-11-07 NOTE — Progress Notes (Signed)
Subjective:    Patient ID: Evan [name], male    DOB: Jul 09, 1931, 78 y.o.   MRN: [id number]  HPI  78 y.o.   wm quit smoker around 1980 history of chronic obstructive airways disease exacerbated by chronic rheumatoid arthritis, associated pulmonary fibrosis, on 02 since 2008  ation in symptoms with  weather or environmental change or other alleviating or aggravating factors   11/07/2011 Dyspnea the same, coughs less. No chest pain. Arthritis in shoulders and hands worse  Sees rheum who feels has RA and wants to use embrel.  Past Medical History  Diagnosis Date  . Coronary atherosclerosis of unspecified type of vessel, native or graft   . Rheumatoid arthritis   . Other emphysema   . Postinflammatory pulmonary fibrosis   . Acute bronchitis   . Chronic airway obstruction, not elsewhere classified      Family History  Problem Relation Age of Onset  . Breast cancer Sister   . Colon cancer Father   . Pulmonary fibrosis Brother      History   Social History  . Marital Status: Married    Spouse Name: N/A    Number of Children: N/A  . Years of Education: N/A   Occupational History  . retired Medical illustrator    Social History Main Topics  . Smoking status: Former Smoker -- 2.0 packs/day for 40 years    Types: Cigarettes    Quit date: 01/22/1985  . Smokeless tobacco: Never Used  . Alcohol Use: Yes     rare ETOH use  . Drug Use: No  . Sexually Active: Not on file   Other Topics Concern  . Not on file   Social History Narrative  . No narrative on file     No Known Allergies   Outpatient Prescriptions Prior to Visit  Medication Sig Dispense Refill  . aspirin 81 MG tablet Take 81 mg by mouth daily.        . Calcium Carbonate (CALCIUM 600) 1500 MG TABS Take 1 tablet by mouth daily.       [name] Kitchen losartan (COZAAR) 100 MG tablet Take 1 tablet by mouth daily.      . metoprolol (TOPROL-XL) 50 MG 24 hr tablet Take 50 mg by mouth daily.        . mirtazapine (REMERON) 30 MG tablet  Take 1 tablet by mouth at bedtime.      [name] Kitchen NEXIUM 40 MG capsule TAKE 1 CAPSULE BY MOUTH ONCE DAILY  30 capsule  5  . QVAR 80 MCG/ACT inhaler INHALE 2 PUFFS INTO THE LUNGS 2 (TWO) TIMES DAILY.  8.7 g  3  . simvastatin (ZOCOR) 40 MG tablet Take 40 mg by mouth at bedtime.        [name] Kitchen SPIRIVA HANDIHALER 18 MCG inhalation capsule INHALE CONTENTS OF ONE CAPSULE ONCE DAILY  30 capsule  6  . temazepam (RESTORIL) 15 MG capsule Take 15 mg by mouth at bedtime.       [name] Kitchen zolpidem (AMBIEN) 5 MG tablet Take 1 tablet by mouth At bedtime.      . meloxicam (MOBIC) 15 MG tablet Take 1 tablet by mouth daily.           Review of Systems  Constitutional:   No  weight loss, night sweats,  Fevers, chills, fatigue, lassitude. HEENT:   No headaches,  Difficulty swallowing,  Tooth/dental problems,  Sore throat,                No sneezing,  itching, ear ache, nasal congestion, post nasal drip,   CV:  No chest pain,  Orthopnea, PND, swelling in lower extremities, anasarca, dizziness, palpitations  GI  No heartburn, indigestion, abdominal pain, nausea, vomiting, diarrhea, change in bowel habits, loss of appetite  Resp: Notes  shortness of breath with exertion not at rest.  No excess mucus, notes  productive cough,  No non-productive cough,  No coughing up of blood.  No change in color of mucus.  Notes  wheezing.  No chest wall deformity  Skin: no rash or lesions.  GU: no dysuria, change in color of urine, no urgency or frequency.  No flank pain.  MS:  No joint pain or swelling.  No decreased range of motion.  No back pain.  Psych:  No change in mood or affect. No depression or anxiety.  No memory loss.     Objective:   Physical Exam  Filed Vitals:   11/07/11 1329 11/07/11 1331  BP:  122/80  Pulse:  99  Temp:  97.7 F (36.5 C)  TempSrc:  Oral  Height:  6\' 1"  (1.854 m)  Weight:  185 lb (83.915 kg)  SpO2: 78% 91%    Gen: Pleasant, well-nourished, in no distress,  normal affect  ENT: No lesions,  mouth  clear,  oropharynx clear, no postnasal drip  Neck: No JVD, no TMG, no carotid bruits  Lungs: No use of accessory muscles, no dullness to percussion,rales dry  Cardiovascular: RRR, heart sounds normal, no murmur or gallops, no peripheral edema  Abdomen: soft and NT, no HSM,  BS normal  Musculoskeletal: No deformities, no cyanosis or clubbing  Neuro: alert, non focal  Skin: Warm, no lesions or rashes         Assessment & Plan:   COPD Chronic obstructive lung disease with a pulmonary fibrotic component and emphysematous components Chronic hypoxemic respiratory failure oxygen dependent Plan Maintain inhaled medications as prescribed Maintain oxygen therapy  PULMONARY FIBROSIS Autoimmune pulmonary fibrosis Rheumatoid arthritis confirmed by rheumatology Plan Per rheumatology    Updated Medication List Outpatient Encounter Prescriptions as of 11/07/2011  Medication Sig Dispense Refill  . aspirin 81 MG tablet Take 81 mg by mouth daily.        . Calcium Carbonate (CALCIUM 600) 1500 MG TABS Take 1 tablet by mouth daily.       [name] Kitchen losartan (COZAAR) 100 MG tablet Take 1 tablet by mouth daily.      . metoprolol (TOPROL-XL) 50 MG 24 hr tablet Take 50 mg by mouth daily.        . mirtazapine (REMERON) 30 MG tablet Take 1 tablet by mouth at bedtime.      [name] Kitchen NEXIUM 40 MG capsule TAKE 1 CAPSULE BY MOUTH ONCE DAILY  30 capsule  5  . QVAR 80 MCG/ACT inhaler INHALE 2 PUFFS INTO THE LUNGS 2 (TWO) TIMES DAILY.  8.7 g  3  . simvastatin (ZOCOR) 40 MG tablet Take 40 mg by mouth at bedtime.        [name] Kitchen SPIRIVA HANDIHALER 18 MCG inhalation capsule INHALE CONTENTS OF ONE CAPSULE ONCE DAILY  30 capsule  6  . temazepam (RESTORIL) 15 MG capsule Take 15 mg by mouth at bedtime.       [name] Kitchen zolpidem (AMBIEN) 5 MG tablet Take 1 tablet by mouth At bedtime.      [name] Kitchen DISCONTD: meloxicam (MOBIC) 15 MG tablet Take 1 tablet by mouth daily.

## 2011-11-07 NOTE — Patient Instructions (Addendum)
No change in medications. Return in     3 months           Flu vaccine

## 2012-02-11 ENCOUNTER — Other Ambulatory Visit: Payer: Self-pay | Admitting: Critical Care Medicine

## 2012-02-18 ENCOUNTER — Ambulatory Visit: Payer: Medicare Other | Admitting: Critical Care Medicine

## 2012-03-04 ENCOUNTER — Other Ambulatory Visit: Payer: Self-pay | Admitting: Adult Health

## 2012-03-18 ENCOUNTER — Ambulatory Visit (INDEPENDENT_AMBULATORY_CARE_PROVIDER_SITE_OTHER): Payer: Medicare Other | Admitting: Pulmonary Disease

## 2012-03-18 ENCOUNTER — Encounter: Payer: Self-pay | Admitting: Pulmonary Disease

## 2012-03-18 VITALS — BP 102/70 | HR 82 | Temp 97.8°F | Ht 73.0 in | Wt 177.2 lb

## 2012-03-18 DIAGNOSIS — J8410 Pulmonary fibrosis, unspecified: Secondary | ICD-10-CM

## 2012-03-18 DIAGNOSIS — J449 Chronic obstructive pulmonary disease, unspecified: Secondary | ICD-10-CM

## 2012-03-18 DIAGNOSIS — J209 Acute bronchitis, unspecified: Secondary | ICD-10-CM | POA: Insufficient documentation

## 2012-03-18 MED ORDER — LEVOFLOXACIN 750 MG PO TABS: 750.0000 mg | ORAL_TABLET | Freq: Every day | ORAL | Status: AC

## 2012-03-18 NOTE — Patient Instructions (Addendum)
Will treat with a course of levaquin for your bronchitis.  Take one a day for 5 days. Please call if you are not improving. Keep followup apptm with Dr. Delford Field.

## 2012-03-18 NOTE — Progress Notes (Signed)
  Subjective:    Patient ID: Evan [name], male    DOB: 1931/07/28, 79 y.o.   MRN: [id number]  HPI [name] for an acute sick visit.  He has known COPD and pulmonary fibrosis with chronic respiratory failure.  He gives a 10 day history of increasing cough with purulent mucus, but no fevers or chills noted.  He does not feel that his breathing has worsened above his usual baseline.  He has had some nasal discharge that is primarily clear, but occasionally colored tinted.  He denies any sinus pressure or significant postnasal drip.   Review of Systems  Constitutional: Negative for fever and unexpected weight change.  HENT: Positive for rhinorrhea. Negative for ear pain, nosebleeds, congestion, sore throat, sneezing, trouble swallowing, dental problem, postnasal drip and sinus pressure.   Eyes: Negative for redness and itching.  Respiratory: Positive for cough. Negative for chest tightness, shortness of breath and wheezing.   Cardiovascular: Negative for palpitations and leg swelling.  Gastrointestinal: Negative for nausea and vomiting.  Genitourinary: Negative for dysuria.  Musculoskeletal: Negative for joint swelling.  Skin: Negative for rash.  Neurological: Negative for headaches.  Hematological: Does not bruise/bleed easily.  Psychiatric/Behavioral: Negative for dysphoric mood. The patient is not nervous/anxious.        Objective:   Physical Exam Well-developed male in no acute distress Nose without purulence or discharge noted Oropharynx clear Neck without lymphadenopathy or thyromegaly Chest with crackles one half the way up bilaterally, no wheezes or rhonchi Cardiac exam with regular rate and rhythm Lower extremities without edema, no cyanosis Alert and oriented, moves all 4 extremities.       Assessment & Plan:

## 2012-03-18 NOTE — Assessment & Plan Note (Signed)
The patient's history is most consistent with acute bronchitis.  He has had symptoms for at least 10 days, and with his underlying pulmonary disease, would treat him with a short course of antibiotics for a bacterial infection.  The patient is to call if he does not improve, and will need to keep his followup with his primary pulmonologist.

## 2012-04-30 ENCOUNTER — Other Ambulatory Visit: Payer: Self-pay | Admitting: Critical Care Medicine

## 2012-05-21 ENCOUNTER — Other Ambulatory Visit (INDEPENDENT_AMBULATORY_CARE_PROVIDER_SITE_OTHER): Payer: Medicare Other

## 2012-05-21 ENCOUNTER — Encounter: Payer: Self-pay | Admitting: Critical Care Medicine

## 2012-05-21 ENCOUNTER — Ambulatory Visit (INDEPENDENT_AMBULATORY_CARE_PROVIDER_SITE_OTHER): Payer: Medicare Other | Admitting: Critical Care Medicine

## 2012-05-21 VITALS — BP 108/66 | HR 71 | Temp 97.8°F | Ht 73.0 in | Wt 180.0 lb

## 2012-05-21 DIAGNOSIS — J8410 Pulmonary fibrosis, unspecified: Secondary | ICD-10-CM

## 2012-05-21 DIAGNOSIS — J449 Chronic obstructive pulmonary disease, unspecified: Secondary | ICD-10-CM

## 2012-05-21 DIAGNOSIS — J9610 Chronic respiratory failure, unspecified whether with hypoxia or hypercapnia: Secondary | ICD-10-CM

## 2012-05-21 LAB — HEPATIC FUNCTION PANEL
ALT: 16 U/L (ref 0–53)
AST: 23 U/L (ref 0–37)
Bilirubin, Direct: 0.1 mg/dL (ref 0.0–0.3)
Total Bilirubin: 0.5 mg/dL (ref 0.3–1.2)
Total Protein: 6.6 g/dL (ref 6.0–8.3)

## 2012-05-21 LAB — CBC
HCT: 33.3 % — ABNORMAL LOW (ref 39.0–52.0)
MCV: 92.8 fl (ref 78.0–100.0)
RBC: 3.59 Mil/uL — ABNORMAL LOW (ref 4.22–5.81)
RDW: 14.1 % (ref 11.5–14.6)
WBC: 5.9 10*3/uL (ref 4.5–10.5)

## 2012-05-21 NOTE — Assessment & Plan Note (Signed)
Chronic respiratory failure due to COPD and associated pulmonary fibrosis Plan Maintain oxygen therapy

## 2012-05-21 NOTE — Progress Notes (Signed)
Quick Note:  Call pt and tell him labs are ok, No change in medications ______ 

## 2012-05-21 NOTE — Assessment & Plan Note (Signed)
Gold stage D. COPD smoking-induced stable at this time Plan Maintain inhaled medications as prescribed with associated oxygen therapy

## 2012-05-21 NOTE — Patient Instructions (Addendum)
Pulmonary function test will be scheduled Labs and chest xray today No change in medications Return 4 months

## 2012-05-21 NOTE — Assessment & Plan Note (Signed)
Autoimmune pulmonary fibrosis stable at this time Plan Repeat chest x-ray and pulmonary function studies Maintain CellCept Obtain labs

## 2012-05-21 NOTE — Progress Notes (Signed)
Subjective:    Patient ID: Evan [name], male    DOB: 1932/01/14, 79 y.o.   MRN: [id number]  HPI 79 y.o. [name] Kitchen   wm quit smoker around 1980 history of chronic obstructive airways disease exacerbated by chronic rheumatoid arthritis, associated pulmonary fibrosis, on 02 since 2008  ation in symptoms with  weather or environmental change or other alleviating or aggravating factors   11/07/2011 Dyspnea the same, coughs less. No chest pain. Arthritis in shoulders and hands worse  Sees rheum who feels has RA and wants to use embrel.  02/2012: [facility] OV: the patient comes in today for an acute sick visit.  He has known COPD and pulmonary fibrosis with chronic respiratory failure.  He gives a 10 day history of increasing cough with purulent mucus, but no fevers or chills noted.  He does not feel that his breathing has worsened above his usual baseline.  He has had some nasal discharge that is primarily clear, but occasionally colored tinted.  He denies any sinus pressure or significant postnasal drip.  05/21/2012 Pt last seen 02/2012 for Acute OV rx levaquin 750/d x 5days No real changes.  Seems like going along OK.  No real chest pain.  No real wheeze.  No real sinus.  Dyspnea about the same.  Has a trigger finger otherwise joints ok. Mucus is sl yellow.   Review of Systems Constitutional:   No  weight loss, night sweats,  Fevers, chills, fatigue, lassitude. HEENT:   No headaches,  Difficulty swallowing,  Tooth/dental problems,  Sore throat,                No sneezing, itching, ear ache, nasal congestion, post nasal drip,   CV:  No chest pain,  Orthopnea, PND, swelling in lower extremities, anasarca, dizziness, palpitations  GI  No heartburn, indigestion, abdominal pain, nausea, vomiting, diarrhea, change in bowel habits, loss of appetite  Resp: +++ shortness of breath with exertion not  at rest.  No excess mucus, ++ productive cough,  No non-productive cough,  No coughing up of blood.  No change in  color of mucus.  No wheezing.  No chest wall deformity  Skin: no rash or lesions.  GU: no dysuria, change in color of urine, no urgency or frequency.  No flank pain.  MS:  ++ joint pain  But better.  No swelling.  No decreased range of motion.  No back pain.  Psych:  No change in mood or affect. No depression or anxiety.  No memory loss.     Objective:   Physical Exam Filed Vitals:   05/21/12 1106  BP: 108/66  Pulse: 71  Temp: 97.8 F (36.6 C)  TempSrc: Oral  Height: 6\' 1"  (1.854 m)  Weight: 180 lb (81.647 kg)  SpO2: 96%    Gen: Pleasant, thin WM , in no distress,  normal affect  ENT: No lesions,  mouth clear,  oropharynx clear, no postnasal drip  Neck: No JVD, no TMG, no carotid bruits  Lungs: No use of accessory muscles, no dullness to percussion, Distant breath sounds with dry rales at the bases Cardiovascular: RRR, heart sounds normal, no murmur or gallops, no peripheral edema  Abdomen: soft and NT, no HSM,  BS normal  Musculoskeletal: No deformities, no cyanosis or clubbing  Neuro: alert, non focal  Skin: Warm, no lesions or rashes  No results found.        Assessment & Plan:   COPD Gold D  Gold stage D. COPD  smoking-induced stable at this time Plan Maintain inhaled medications as prescribed with associated oxygen therapy  PULMONARY FIBROSIS Autoimmune pulmonary fibrosis stable at this time Plan Repeat chest x-ray and pulmonary function studies Maintain CellCept Obtain labs  Chronic respiratory failure Chronic respiratory failure due to COPD and associated pulmonary fibrosis Plan Maintain oxygen therapy   Updated Medication List Outpatient Encounter Prescriptions as of 05/21/2012  Medication Sig Dispense Refill  . aspirin 81 MG tablet Take 81 mg by mouth daily.        . Calcium Carbonate (CALCIUM 600) 1500 MG TABS Take 1 tablet by mouth daily.       [name] Kitchen losartan (COZAAR) 100 MG tablet Take 1 tablet by mouth daily.      . metoprolol  (TOPROL-XL) 50 MG 24 hr tablet Take 50 mg by mouth daily.        . mirtazapine (REMERON) 30 MG tablet Take 1 tablet by mouth at bedtime.      . Multiple Vitamin (MULTIVITAMIN) tablet Take 1 tablet by mouth daily.      . mycophenolate (CELLCEPT) 500 MG tablet Take 500 mg by mouth 2 (two) times daily.      [name] Kitchen NEXIUM 40 MG capsule TAKE 1 CAPSULE BY MOUTH ONCE DAILY  30 capsule  4  . QVAR 80 MCG/ACT inhaler INHALE 2 PUFFS INTO THE LUNGS 2 (TWO) TIMES DAILY.  8.7 g  3  . simvastatin (ZOCOR) 40 MG tablet Take 40 mg by mouth at bedtime.        [name] Kitchen SPIRIVA HANDIHALER 18 MCG inhalation capsule INHALE CONTENTS OF ONE CAPSULE ONCE DAILY  30 capsule  5  . sulfamethoxazole-trimethoprim (BACTRIM DS) 800-160 MG per tablet Take 3 tablets by mouth. 3 tablets per week      . temazepam (RESTORIL) 15 MG capsule Take 15 mg by mouth at bedtime.       [name] Kitchen zolpidem (AMBIEN) 5 MG tablet Take 1 tablet by mouth At bedtime.       No facility-administered encounter medications on file as of 05/21/2012.

## 2012-05-22 NOTE — Progress Notes (Signed)
Quick Note:  lmomtcb for pt ______ 

## 2012-06-10 ENCOUNTER — Telehealth: Payer: Self-pay | Admitting: Critical Care Medicine

## 2012-06-10 NOTE — Telephone Encounter (Signed)
He has a rheumatologic cause for fibrosis.  He will qualify once it is released next year but the current trials of which we are participating do not allow autoimmune pts with fibrosis.  We will revisit once drug goes on the market next year

## 2012-06-10 NOTE — Telephone Encounter (Signed)
I spoke with pt and he stated he read in the NYT about 2 pulm fibrosis medications: 1) pirfenidone 2) ? Ninetidaide. He stated he read the pirfenidone is available in other countries and in there [place] some clinics are doing these medications as study meds. Pt states he wants to see if he qualifies. Please advise Dr. Delford Field thanks

## 2012-06-10 NOTE — Telephone Encounter (Signed)
I spoke with pt and is aware. He voiced his understanding and needed nothing further

## 2012-06-17 ENCOUNTER — Telehealth: Payer: Self-pay | Admitting: Critical Care Medicine

## 2012-06-17 NOTE — Telephone Encounter (Signed)
i am fine with this   He is a very very nice patient

## 2012-06-17 NOTE — Telephone Encounter (Signed)
Spoke to pt. I let him know that we would have to speak with PW about him switching MDs. Advised that CY would also have to be made aware of this and give the okay to see him. He agreed and verbalized understanding.  PW - please advise if you are okay with this pt seeing CY for a second opinion. Thanks.

## 2012-06-17 NOTE — Telephone Encounter (Signed)
CY - please advise if you are okay with seeing this pt for a second opinion. Thanks.

## 2012-06-18 NOTE — Telephone Encounter (Signed)
appt set for 07-03-12 at 10:45. Carron Curie, CMA

## 2012-06-18 NOTE — Telephone Encounter (Signed)
Ok - when we can schedule routine

## 2012-07-03 ENCOUNTER — Ambulatory Visit (INDEPENDENT_AMBULATORY_CARE_PROVIDER_SITE_OTHER): Payer: Medicare Other | Admitting: Internal Medicine

## 2012-07-03 ENCOUNTER — Encounter: Payer: Self-pay | Admitting: Internal Medicine

## 2012-07-03 VITALS — BP 110/68 | HR 80 | Ht 73.0 in | Wt 176.4 lb

## 2012-07-03 DIAGNOSIS — J8410 Pulmonary fibrosis, unspecified: Secondary | ICD-10-CM

## 2012-07-03 DIAGNOSIS — M069 Rheumatoid arthritis, unspecified: Secondary | ICD-10-CM

## 2012-07-03 DIAGNOSIS — J449 Chronic obstructive pulmonary disease, unspecified: Secondary | ICD-10-CM

## 2012-07-03 NOTE — Assessment & Plan Note (Signed)
On my review of the images, the CT scan of July, 2013 shows severe emphysema changes. I think this is an example of the interstitial fibrosis stabilizing his emphysema to prevent expiratory airway collapse, so his spirometry flows look better than expected.

## 2012-07-03 NOTE — Progress Notes (Signed)
07/03/12- 62 yoM former smoker (80 pk yrs) has been followed by Dr Delford Field and asking my opinion. (79 y.o. [name] Kitchen wm quit smoking around 1980 history of chronic obstructive airways disease exacerbated by chronic rheumatoid arthritis, associated pulmonary fibrosis, on 02 since 2008  ation in symptoms with weather or environmental change or other alleviating or aggravating factors) Evan [name] asked second opinion specifically about eligibility for Perfenidone study. He has been researching these issues. Dr. Delford Field indicated he was not a candidate for this study because he had rheumatoid arthritis. He notices little apparent change in symptoms. Little cough. He denies chest pain or edema. One remote episode of pneumonia.  Very occasional heartburn, incidental. No history of recognized aspiration. He has been aware of pulmonary fibrosis for many years, but was diagnosed with rheumatoid arthritis in his hands only 2 years ago. He has not responded to treatment efforts with steroids or azathioprine A brother died with pulmonary fibrosis but did not have arthritis. Radiology interpretation of the July 2013 chest CT reported overall appearance is "most consistent with usual interstitial pneumonia (UIP)". CT chest 08/13/11- IMPRESSION:  1. The appearance of the lungs is again compatible with an  underlying interstitial lung disease, and the overall appearance is  most consistent with usual interstitial pneumonia (UIP). Findings  appear to have only slightly progressed when compared to prior CT  scan 04/01/2008.  2. Atherosclerosis, including left main and three-vessel coronary  artery disease. In addition, there is mild aneurysmal dilatation  of the ascending thoracic aorta (4.6 cm in diameter), and ectasia  of the aortic arch (4.0 cm in diameter).  3. There is also evidence of moderate centrilobular emphysema,  most pronounced the lung apices.  4. Dilatation of the pulmonic trunk and main pulmonary arteries   bilaterally suggestive of pulmonary arterial hypertension.  Original Report Authenticated By: Florencia Reasons, M.D. PFT 08/25/10- mild restrictive defect, no obstructive defect, severe decrease in diffusion capacity insignificant response to bronchodilator. FVC 3.33/74%, FEV1 2.63/90%, FEV1/FVC 0.79. TLC 75% DLCO 34%.  Prior to Admission medications   Medication Sig Start Date End Date Taking? Authorizing Provider  aspirin 81 MG tablet Take 81 mg by mouth daily.     Yes Historical Provider, MD  Calcium Carbonate (CALCIUM 600) 1500 MG TABS Take 1 tablet by mouth daily.    Yes Historical Provider, MD  losartan (COZAAR) 100 MG tablet Take 1 tablet by mouth daily.   Yes Historical Provider, MD  metoprolol (TOPROL-XL) 50 MG 24 hr tablet Take 50 mg by mouth daily.     Yes Historical Provider, MD  mirtazapine (REMERON) 30 MG tablet Take 1 tablet by mouth at bedtime.   Yes Historical Provider, MD  Multiple Vitamin (MULTIVITAMIN) tablet Take 1 tablet by mouth daily.   Yes Historical Provider, MD  mycophenolate (CELLCEPT) 500 MG tablet Take 500 mg by mouth 2 (two) times daily.   Yes Historical Provider, MD  NEXIUM 40 MG capsule TAKE 1 CAPSULE BY MOUTH ONCE DAILY 03/04/12  Yes Storm Frisk, MD  QVAR 80 MCG/ACT inhaler INHALE 2 PUFFS INTO THE LUNGS 2 (TWO) TIMES DAILY. 02/11/12  Yes Storm Frisk, MD  simvastatin (ZOCOR) 40 MG tablet Take 40 mg by mouth at bedtime.     Yes Historical Provider, MD  SPIRIVA HANDIHALER 18 MCG inhalation capsule INHALE CONTENTS OF ONE CAPSULE ONCE DAILY 04/30/12  Yes Storm Frisk, MD  sulfamethoxazole-trimethoprim (BACTRIM DS) 800-160 MG per tablet Take 3 tablets by mouth. 3 tablets per week  Yes Historical Provider, MD  zolpidem (AMBIEN) 5 MG tablet Take 1 tablet by mouth At bedtime.   Yes Historical Provider, MD   Past Medical History  Diagnosis Date  . Coronary atherosclerosis of unspecified type of vessel, native or graft   . Rheumatoid arthritis(714.0)   .  Other emphysema   . Postinflammatory pulmonary fibrosis   . Acute bronchitis   . Chronic airway obstruction, not elsewhere classified    Past Surgical History  Procedure Laterality Date  . Stents with ami  2004  . Foot surgery      left  . Vericose vein surgery    . Aortic aneuyrsm thoracic      endovascular brabham   Family History  Problem Relation Age of Onset  . Breast cancer Sister   . Colon cancer Father   . Pulmonary fibrosis Brother    History   Social History  . Marital Status: Married    Spouse Name: N/A    Number of Children: N/A  . Years of Education: N/A   Occupational History  . retired Medical illustrator    Social History Main Topics  . Smoking status: Former Smoker -- 2.00 packs/day for 40 years    Types: Cigarettes    Quit date: 01/22/1985  . Smokeless tobacco: Never Used  . Alcohol Use: Yes     Comment: rare ETOH use  . Drug Use: No  . Sexually Active: Not on file   Other Topics Concern  . Not on file   Social History Narrative  . No narrative on file   ROS-see HPI Constitutional:   No-   weight loss, night sweats, fevers, chills, fatigue, lassitude. HEENT:   No-  headaches, difficulty swallowing, tooth/dental problems, sore throat,       No-  sneezing, itching, ear ache, +nasal congestion, post nasal drip,  CV:  No-   chest pain, orthopnea, PND, swelling in lower extremities, anasarca,                                  dizziness, palpitations Resp: +  shortness of breath with exertion or at rest.              No-   productive cough,  No non-productive cough,  No- coughing up of blood.              No-   change in color of mucus.  No- wheezing.   Skin: No-   rash or lesions. GI:  +  heartburn, indigestion, no-abdominal pain, nausea, vomiting, diarrhea,                 change in bowel habits, loss of appetite GU: No-   dysuria, change in color of urine, no urgency or frequency.  No- flank pain. MS:  + joint pain or swelling.  No- decreased range of  motion.  No- back pain. Neuro-     nothing unusual Psych:  No- change in mood or affect. No depression or anxiety.  No memory loss.  OBJ- Physical Exam General- Alert, Oriented, Affect-appropriate, Distress- none acute. Trim, O2 2 L Skin- rash-none, lesions- none, excoriation- none Lymphadenopathy- none Head- atraumatic            Eyes- Gross vision intact, PERRLA, conjunctivae and secretions clear            Ears- Hearing, canals-normal  Nose- Clear, no-Septal dev, mucus, polyps, erosion, perforation             Throat- Mallampati II , mucosa clear , drainage- none, tonsils- atrophic Neck- flexible , trachea midline, no stridor , thyroid nl, carotid no bruit Chest - symmetrical excursion , unlabored           Heart/CV- RRR , no murmur , no gallop  , no rub, nl s1 s2                           - JVD- none , edema- none, stasis changes- none, varices- none           Lung- +crackles to mid back, wheeze- none, cough- none , dullness-none, rub- none           Chest wall-  Abd- tender-no, distended-no, bowel sounds-present, HSM- no Br/ Gen/ Rectal- Not done, not indicated Extrem- cyanosis- none, clubbing, none, atrophy- none, strength- nl. Hypothenar wasting,  Neuro- grossly intact to observation

## 2012-07-03 NOTE — Assessment & Plan Note (Signed)
The radiologist considered the July, 2013 chest CT pattern to be consistent with UIP. The patient's brother died with pulmonary fibrosis but had no arthritis. Extra articular rheumatoid changes can develop before the arthritis. However it is possible that he has UIP and also has rheumatoid arthritis.   His respiratory and radiologic patterns are changing very slowly, so he may be able to wait until Perfenidone is commercially available. I will offer high definition CT scan of chest and we can talk with radiology and Dr. Corliss Skains.

## 2012-07-03 NOTE — Patient Instructions (Addendum)
I will address your candidacy for the Perfenadone trial with the research group here.   Order- High definition CT chest, no contrast      Dx pulmonary fibrosis, rheumatoid arthritis.

## 2012-07-07 NOTE — Addendum Note (Signed)
Addended by: Reynaldo Minium C on: 07/07/2012 12:19 PM   Modules accepted: Orders

## 2012-07-09 ENCOUNTER — Ambulatory Visit (INDEPENDENT_AMBULATORY_CARE_PROVIDER_SITE_OTHER)
Admission: RE | Admit: 2012-07-09 | Discharge: 2012-07-09 | Disposition: A | Discharge status: Home / Self Care | Payer: Medicare Other | Source: Ambulatory Visit | Attending: Internal Medicine | Admitting: Internal Medicine

## 2012-07-09 DIAGNOSIS — J8410 Pulmonary fibrosis, unspecified: Secondary | ICD-10-CM

## 2012-07-09 DIAGNOSIS — M069 Rheumatoid arthritis, unspecified: Secondary | ICD-10-CM

## 2012-08-01 ENCOUNTER — Other Ambulatory Visit: Payer: Self-pay | Admitting: Critical Care Medicine

## 2012-09-09 ENCOUNTER — Other Ambulatory Visit: Payer: Self-pay | Admitting: Critical Care Medicine

## 2012-09-16 ENCOUNTER — Telehealth: Payer: Self-pay | Admitting: Internal Medicine

## 2012-09-16 NOTE — Telephone Encounter (Signed)
Per CY-NO he does not per discussion with our doctor here in charge of the program.

## 2012-09-16 NOTE — Telephone Encounter (Signed)
Pt advised. Yukie Bergeron, CMA  

## 2012-09-16 NOTE — Telephone Encounter (Signed)
Spoke with pt  He states that [facility] had discussed the perfenadone study with him at last ov in June He wants update on whether or not he qualifies for this or not Next ov 11/03/12 Please advise, thanks!

## 2012-10-02 ENCOUNTER — Other Ambulatory Visit: Payer: Self-pay | Admitting: Critical Care Medicine

## 2012-11-03 ENCOUNTER — Ambulatory Visit (INDEPENDENT_AMBULATORY_CARE_PROVIDER_SITE_OTHER): Payer: Medicare Other | Admitting: Internal Medicine

## 2012-11-03 VITALS — BP 118/64 | HR 64 | Ht 73.0 in | Wt 186.2 lb

## 2012-11-03 DIAGNOSIS — Z23 Encounter for immunization: Secondary | ICD-10-CM

## 2012-11-03 DIAGNOSIS — J8410 Pulmonary fibrosis, unspecified: Secondary | ICD-10-CM

## 2012-11-03 DIAGNOSIS — J9610 Chronic respiratory failure, unspecified whether with hypoxia or hypercapnia: Secondary | ICD-10-CM

## 2012-11-03 MED ORDER — SULFAMETHOXAZOLE-TMP DS 800-160 MG PO TABS: ORAL_TABLET | ORAL | Status: DC

## 2012-11-03 NOTE — Progress Notes (Signed)
07/03/12- 79 yoM former smoker (80 pk yrs) has been followed by Dr Evan [name] and asking my opinion. (79 y.o. Evan [name] Kitchen wm quit smoking around 1980 history of chronic obstructive airways disease exacerbated by chronic rheumatoid arthritis, associated pulmonary fibrosis, on 02 since 2008  ation in symptoms with weather or environmental change or other alleviating or aggravating factors) Evan [name] asked second opinion specifically about eligibility for Perfenidone study. He has been researching these issues. Dr. Delford [name] indicated he was not a candidate for this study because he had rheumatoid arthritis. He notices little apparent change in symptoms. Little cough. He denies chest pain or edema. One remote episode of pneumonia.  Very occasional heartburn, incidental. No history of recognized aspiration. He has been aware of pulmonary fibrosis for many years, but was diagnosed with rheumatoid arthritis in his hands only 2 years ago. He has not responded to treatment efforts with steroids or azathioprine A brother died with pulmonary fibrosis but did not have arthritis. Radiology interpretation of the July 2013 chest CT reported overall appearance is "most consistent with usual interstitial pneumonia (UIP)". CT chest 08/13/11- IMPRESSION:  1. The appearance of the lungs is again compatible with an  underlying interstitial lung disease, and the overall appearance is  most consistent with usual interstitial pneumonia (UIP). Findings  appear to have only slightly progressed when compared to prior CT  scan 04/01/2008.  2. Atherosclerosis, including left main and three-vessel coronary  artery disease. In addition, there is mild aneurysmal dilatation  of the ascending thoracic aorta (4.6 cm in diameter), and ectasia  of the aortic arch (4.0 cm in diameter).  3. There is also evidence of moderate centrilobular emphysema,  most pronounced the lung apices.  4. Dilatation of the pulmonic trunk and main pulmonary arteries   bilaterally suggestive of pulmonary arterial hypertension.  Original Report Authenticated By: Evan [name], M.D. PFT 08/25/10- mild restrictive defect, no obstructive defect, severe decrease in diffusion capacity insignificant response to bronchodilator. FVC 3.33/74%, FEV1 2.63/90%, FEV1/FVC 0.79. TLC 75% DLCO 34%.  11/03/12- 79 yoM former smoker (80 pk yrs) followed for interstitial fibrosis c/w UIP, complicated by RA which makes him ineligible for perfenadone study.Evan [name] For: SOB gradually worsening - Denies cough or chest discomfort - Diff sleeping - Averages 3-4 hrs nightly - Wants flu shot O2 4L/ [facility] continuous Complains of difficulty initiating and maintaining sleep-Fall asleep easily. Discussed temazepam. Excluded from perfenadone trial because he is considered to have RA associated pulmonary fibrosis.  ROS-see HPI Constitutional:   No-   weight loss, night sweats, fevers, chills, fatigue, lassitude. HEENT:   No-  headaches, difficulty swallowing, tooth/dental problems, sore throat,       No-  sneezing, itching, ear ache, +nasal congestion, post nasal drip,  CV:  No-   chest pain, orthopnea, PND, swelling in lower extremities, anasarca, dizziness, palpitations Resp: +  shortness of breath with exertion or at rest.              No-   productive cough,  No non-productive cough,  No- coughing up of blood.              No-   change in color of mucus.  No- wheezing.   Skin: No-   rash or lesions. GI:  +  heartburn, indigestion, no-abdominal pain, nausea, vomiting,  GU:  MS:  + joint pain or swelling.   Neuro-     nothing unusual Psych:  No- change in mood or affect. No depression or anxiety.  No memory loss.  OBJ- Physical Exam General- Alert, Oriented, Affect-appropriate, Distress- none acute. Trim, O2 3-4 L Skin- +steroid type ecchymoses on arms Lymphadenopathy- none Head- atraumatic            Eyes- Gross vision intact, PERRLA, conjunctivae and secretions clear             Ears- Hearing, canals-normal            Nose- Clear, no-Septal dev, mucus, polyps, erosion, perforation             Throat- Mallampati II , mucosa clear , drainage- none, tonsils- atrophic Neck- flexible , trachea midline, no stridor , thyroid nl, carotid no bruit Chest - symmetrical excursion , unlabored           Heart/CV- RRR , no murmur , no gallop  , no rub, nl s1 s2                           - JVD- none , edema- none, stasis changes- none, varices- none           Lung- +crackles to mid back, wheeze- none, cough- none , dullness-none, rub- none           Chest wall-  Abd-  Br/ Gen/ Rectal- Not done, not indicated Extrem- cyanosis- none, clubbing, none, atrophy- none, strength- nl. Hypothenar wasting,  Neuro- grossly intact to observation

## 2012-11-03 NOTE — Patient Instructions (Signed)
Hi Dose Flu vax  You will be a candidate for the new Prevnar pnuemonia vaccine -13 this winter.

## 2012-11-16 ENCOUNTER — Encounter: Payer: Self-pay | Admitting: Internal Medicine

## 2012-11-16 NOTE — Assessment & Plan Note (Signed)
Oxygen dependent.  

## 2012-11-16 NOTE — Assessment & Plan Note (Signed)
Chronic hypoxic respiratory failure Watching for progression. His coronary artery disease may become more important Plan-continue oxygen. High-dose flu vaccine. Will need Prevnar-13 pneumococcal vaccine later

## 2012-12-03 ENCOUNTER — Other Ambulatory Visit: Payer: Self-pay | Admitting: Critical Care Medicine

## 2012-12-03 NOTE — Telephone Encounter (Signed)
Ok to refill prn.

## 2012-12-03 NOTE — Telephone Encounter (Signed)
PW was filling nexium 40 mg qd for pt.  Pt has now switched to Dr. Maple Hudson.  Dr. Maple Hudson, pls advise if you are ok with refilling this mediation for pt.  Thank you.

## 2012-12-09 IMAGING — CT CT ANGIO AOBIFEM WO/W CM
1 of 10 series · 11 of 33 positions shown · IV contrast ([ID] OMNI [id number])
Comparison: 04/21/2010

CLINICAL DATA: Left iliac arterial aneurysm

CT ANGIOGRAM ABDOMINAL AORTA AND BILATERAL LOWER EXTREMITIES WITH
CONTRAST
TECHNIQUE: Axial helical CT of the abdominal aorta and bilateral
lower extremities after 150ml Optiray 350 IV.  Coronal and sagittal
reconstructions were generated for vascular evaluation.

[Series 5: runoff · axial · 0.74mm/px · z∈[-1210,-103]mm · 11 of 525 slices shown]
[im 44/525  soft-tissue]
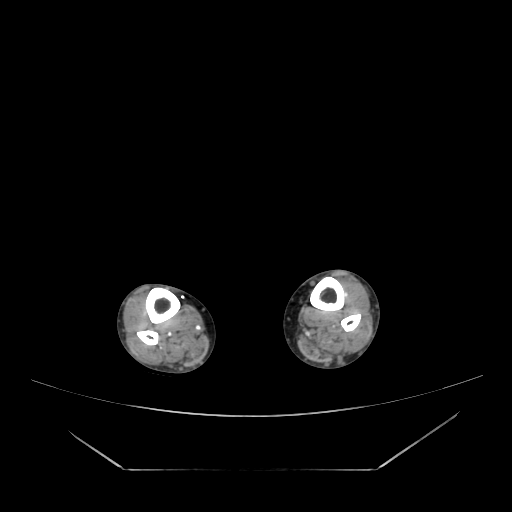
[im 88/525  bone]
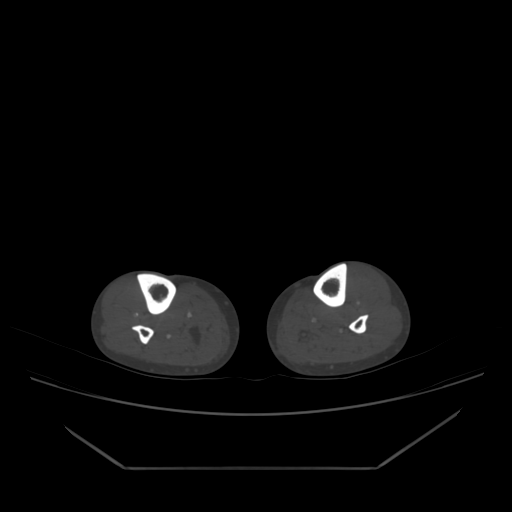
[im 132/525  soft-tissue]
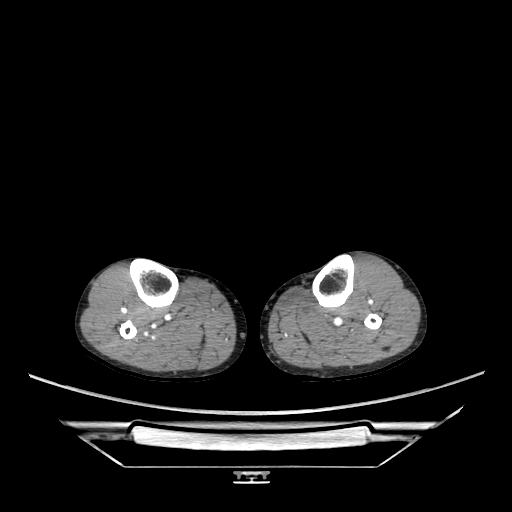
[im 175/525  bone]
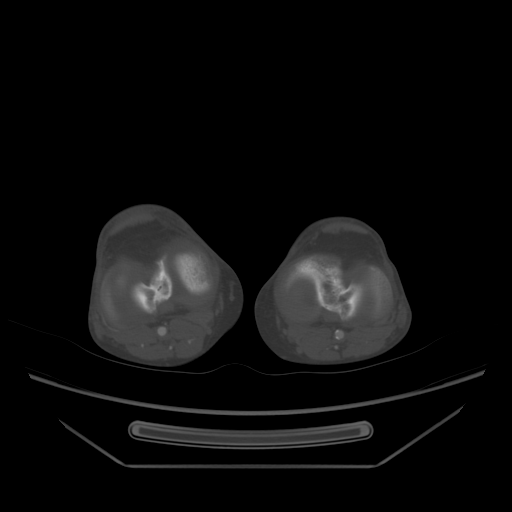
[im 219/525  soft-tissue]
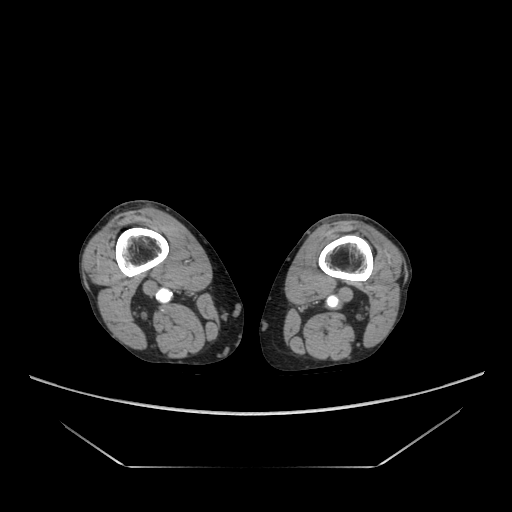
[im 263/525  bone]
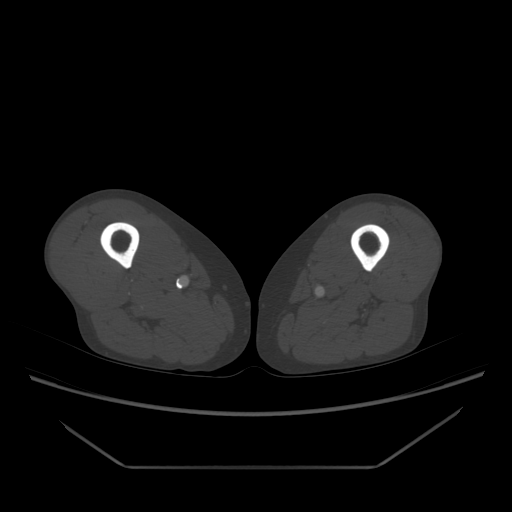
[im 306/525  soft-tissue]
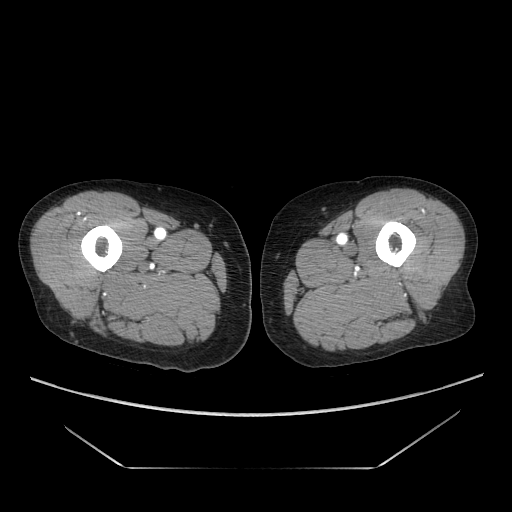
[im 350/525  bone]
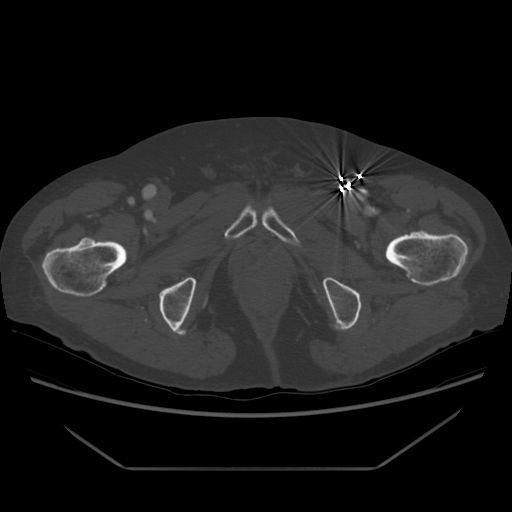
[im 394/525  soft-tissue]
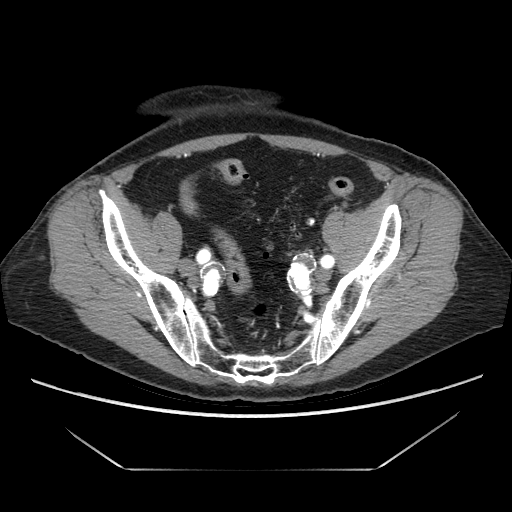
[im 437/525  bone]
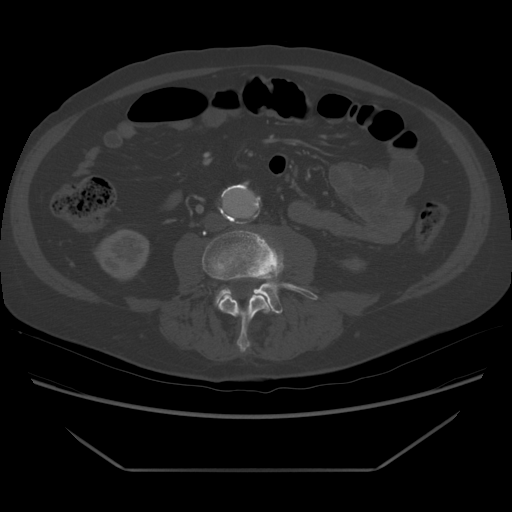
[im 481/525  soft-tissue]
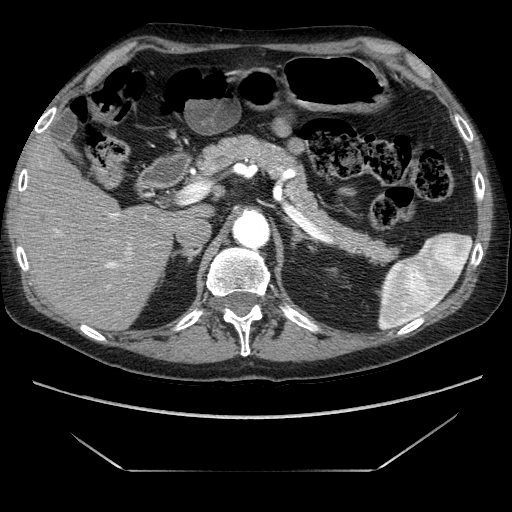

[11 of 33 positions shown; findings below may reference images not displayed]

FINDINGS: Scattered atheromatous plaque through the abdominal aorta.
There is aneurysmal dilatation of the infrarenal abdominal   aorta
to a diameter of maximal transverse diameter 3.8 cm, tapering to a
3 cm just above the bifurcation.  There is a small amount mural
thrombus in the infrarenal abdominal aorta.

There is partially   calcified plaque at the origin of the celiac
axis resulting in approximately 50% diameter stenosis.  There is
mild aneurysmal dilatation of the more distal celiac axis extending
to its bifurcation.  Distal branching is unremarkable.

There is partially calcified plaque at the origin of the superior
mesenteric artery resulting in approximately 50% diameter stenosis
over a segment less than 1 cm.  The more distal SMA is widely
patent.

On the left, there is a single dominant renal artery with partially
calcified ostial plaque resulting at least a 75% diameter stenosis
over a short segment less than 1 cm.  The vessel is unremarkable
distally.  There are at least two small accessory left renal
arteries which are at the lower limits of resolution of the
scanner.

On the right, there is a single renal artery with partially
calcified ostial plaque resulting in approximately 50% diameter
stenosis over a short segment.  Vessels unremarkable distally.

The inferior mesenteric artery is patent, arising just distal to
the aneurysmal segment of the aorta.

On the left, there is extensive   calcified atheromatous plaque
through the length of the tortuous common iliac artery.  There is a
bilobed fusiform aneurysmal dilatation of the left common iliac
artery. The proximal segment is dilated to a diameter 4.8 cm,   by
my measurement slightly increased from 4.5 cm on the prior exam.
There is a saccular extension anteromedially from the proximal
aspect of the aneurysm as before.  The more distal component is
dilated to 2.5 cm as before.  The vessel tapers to a diameter of
1.6 cm just above its bifurcation.  There is eccentric mural
thrombus in the aneurysmal segments.

There is aneurysmal dilatation of the left internal iliac artery to
a diameter of 2.1 cm, with mural thrombus.  This is stable from the
prior study.

Mild eccentric calcified plaque in the left external iliac artery
without aneurysmal dilatation.  There is mild dilatation of the
left common femoral artery to a diameter of 2.2 cm.  Metallic
vascular clips overlie this segment.

There is eccentric calcified plaque in the left superficial femoral
artery without high-grade stenosis.  There is moderate extensive
calcified plaque in the  proximal and  distal popliteal artery
without high-grade stenosis.  There is calcified   nonstenotic
plaque in the tibioperoneal trunk.  There is contiguous three-
vessel tibial runoff.

On the right, there is mild   fusiform aneurysmal dilatation of the
common iliac artery to a diameter 2.1cm, tapering to a diameter of
19 mm at its bifurcation.  There is 19 mm dilatation of the
proximal right internal iliac artery.  There is mild tortuosity of
the right common iliac artery without aneurysm.  Atheromatous
plaque through the common femoral artery without stenosis.
Scattered non stenotic partially calcified plaque in the mid and
distal   superficial femoral artery, and the proximal and distal
popliteal artery.  The anterior tibial artery occludes the mid calf
level.  There is contiguous posterior tibial and peroneal runoff.

Venous phase was not obtained.

Coarse subpleural fibrotic changes are noted in the visualized lung
bases.  Small low attenuation hepatic lesions probably cysts,
stable. Unremarkable arterial phase evaluation of spleen, adrenal
glands, kidneys, pancreas, gallbladder.  No free air.  No ascites.
Small bowel decompressed.  Multiple sigmoid diverticula without
adjacent inflammatory/edematous change.  Urinary bladder
physiologically distended.  Mild prostatic enlargement.
Degenerative disc disease L3-S1.  Facet degenerative changes in the
lower lumbar spine.

IMPRESSION

1.  Some interval increase in size of bilobed 4.8 cm left common
iliac artery aneurysm.
2.  Smaller right common iliac, bilateral internal iliac, and left
common femoral artery aneurysms.
3.  3.8 cm infrarenal abdominal aortic aneurysm.
4.  Sigmoid diverticulosis.

## 2012-12-31 ENCOUNTER — Telehealth: Payer: Self-pay | Admitting: Internal Medicine

## 2012-12-31 NOTE — Telephone Encounter (Signed)
Appt made for 01/06/2013 at 10:30am. Pt is aware.

## 2013-01-06 ENCOUNTER — Encounter: Payer: Self-pay | Admitting: Internal Medicine

## 2013-01-06 ENCOUNTER — Ambulatory Visit (INDEPENDENT_AMBULATORY_CARE_PROVIDER_SITE_OTHER): Payer: Medicare Other | Admitting: Internal Medicine

## 2013-01-06 VITALS — BP 120/76 | HR 97 | Ht 73.0 in | Wt 190.0 lb

## 2013-01-06 DIAGNOSIS — J449 Chronic obstructive pulmonary disease, unspecified: Secondary | ICD-10-CM

## 2013-01-06 DIAGNOSIS — J8410 Pulmonary fibrosis, unspecified: Secondary | ICD-10-CM

## 2013-01-06 DIAGNOSIS — M069 Rheumatoid arthritis, unspecified: Secondary | ICD-10-CM

## 2013-01-06 DIAGNOSIS — J9610 Chronic respiratory failure, unspecified whether with hypoxia or hypercapnia: Secondary | ICD-10-CM

## 2013-01-06 NOTE — Assessment & Plan Note (Signed)
Difficult to separate out any obstruction from his significant fibrotic restriction

## 2013-01-06 NOTE — Assessment & Plan Note (Signed)
Continued Rheumatology f/u

## 2013-01-06 NOTE — Patient Instructions (Addendum)
Order- referral consultation to Dr Colletta [place]- pulmonary fibrosis  Talk with [facility] about an oxygen concentrator for travel to [place]. You would need 3 Liter pulsed, and need to discuss using the device with [place] electrical voltages.

## 2013-01-06 NOTE — Assessment & Plan Note (Signed)
I outlined available therapy Plan- referred to Dr Marchelle Gearing to discuss possible treatment for interstitial lung disease- RA vs IPF

## 2013-01-06 NOTE — Progress Notes (Signed)
07/03/12- 79 yoM former smoker (80 pk yrs) has been followed by Dr Delford Field and asking my opinion. (79 y.o. [name] Kitchen wm quit smoking around 1980 history of chronic obstructive airways disease exacerbated by chronic rheumatoid arthritis, associated pulmonary fibrosis, on 02 since 2008  ation in symptoms with weather or environmental change or other alleviating or aggravating factors) Mr Bastin asked second opinion specifically about eligibility for Perfenidone study. He has been researching these issues. Dr. Delford Field indicated he was not a candidate for this study because he had rheumatoid arthritis. He notices little apparent change in symptoms. Little cough. He denies chest pain or edema. One remote episode of pneumonia.  Very occasional heartburn, incidental. No history of recognized aspiration. He has been aware of pulmonary fibrosis for many years, but was diagnosed with rheumatoid arthritis in his hands only 2 years ago. He has not responded to treatment efforts with steroids or azathioprine A brother died with pulmonary fibrosis but did not have arthritis. Radiology interpretation of the July 2013 chest CT reported overall appearance is "most consistent with usual interstitial pneumonia (UIP)". CT chest 08/13/11- IMPRESSION:  1. The appearance of the lungs is again compatible with an  underlying interstitial lung disease, and the overall appearance is  most consistent with usual interstitial pneumonia (UIP). Findings  appear to have only slightly progressed when compared to prior CT  scan 04/01/2008.  2. Atherosclerosis, including left main and three-vessel coronary  artery disease. In addition, there is mild aneurysmal dilatation  of the ascending thoracic aorta (4.6 cm in diameter), and ectasia  of the aortic arch (4.0 cm in diameter).  3. There is also evidence of moderate centrilobular emphysema,  most pronounced the lung apices.  4. Dilatation of the pulmonic trunk and main pulmonary arteries   bilaterally suggestive of pulmonary arterial hypertension.  Original Report Authenticated By: Florencia Reasons, M.D. PFT 08/25/10- mild restrictive defect, no obstructive defect, severe decrease in diffusion capacity insignificant response to bronchodilator. FVC 3.33/74%, FEV1 2.63/90%, FEV1/FVC 0.79. TLC 75% DLCO 34%.  11/03/12- 79 yoM former smoker (80 pk yrs) followed for interstitial fibrosis c/w UIP, complicated by RA which makes him ineligible for perfenadone study.[name] For: SOB gradually worsening - Denies cough or chest discomfort - Diff sleeping - Averages 3-4 hrs nightly - Wants flu shot O2 4L/ Advanced continuous Complains of difficulty initiating and maintaining sleep-Fall asleep easily. Discussed temazepam. Excluded from perfenadone trial because he is considered to have RA associated pulmonary fibrosis.  01/06/13- 79 yoM former smoker (80 pk yrs) followed for interstitial fibrosis c/w UIP, complicated by RA, CAD/ MI, Gold D COPD follows for- pt is planning on vacationing in june for 10 days, wants clearance for vacation.  Pt c/o some SOB, nothing unusual for him. Continues oxygen 4 L/Advanced. No recent changes or acute events. He again asks about availability of perfenadone and I briefly discussed both this and OFEV. I suggested we let him talk with Dr Marchelle Gearing.  He wants to vacation in [place] next summer. We discussed how he wants to spend his life, travel with a concentrator, self-pacing effort. CT 07/09/12 IMPRESSION:  Stable findings of moderate to severe COPD with subpleural  reticulation and honeycombing in a pattern typical for the provided  history of pulmonary fibrosis/UIP.  Original Report Authenticated By: Christiana Pellant, M.D.  ROS-see HPI Constitutional:   No-   weight loss, night sweats, fevers, chills, fatigue, lassitude. HEENT:   No-  headaches, difficulty swallowing, tooth/dental problems, sore throat,  No-  sneezing, itching, ear ache, +nasal  congestion, post nasal drip,  CV:  No-   chest pain, orthopnea, PND, swelling in lower extremities, anasarca, dizziness, palpitations Resp: +  shortness of breath with exertion or at rest.              No-   productive cough,  No non-productive cough,  No- coughing up of blood.              No-   change in color of mucus.  No- wheezing.   Skin: No-   rash or lesions. GI:  +  heartburn, indigestion, no-abdominal pain, nausea, vomiting,  GU:  MS:  + joint pain or swelling.   Neuro-     nothing unusual Psych:  No- change in mood or affect. No depression or anxiety.  No memory loss.  OBJ- Physical Exam General- Alert, Oriented, Affect-appropriate, Distress- none acute. Trim, O2 4 L Skin- +steroid type ecchymoses on arms Lymphadenopathy- none Head- atraumatic            Eyes- Gross vision intact, PERRLA, conjunctivae and secretions clear            Ears- Hearing, canals-normal            Nose- Clear, no-Septal dev, mucus, polyps, erosion, perforation             Throat- Mallampati II , mucosa clear , drainage- none, tonsils- atrophic Neck- flexible , trachea midline, no stridor , thyroid nl, carotid no bruit Chest - symmetrical excursion , unlabored           Heart/CV- RRR , no murmur , no gallop  , no rub, nl s1 s2                           - JVD- none , edema- none, stasis changes- none, varices- none           Lung- +crackles to mid back, wheeze- none, cough- none , dullness-none, rub- none           Chest wall-  Abd-  Br/ Gen/ Rectal- Not done, not indicated Extrem- cyanosis- none, clubbing, none, atrophy- none, strength- nl. Hypothenar wasting,  Neuro- grossly intact to observation

## 2013-01-06 NOTE — Assessment & Plan Note (Signed)
Due to fibrosis. He should be able to travel with a concentrator- 3L/ pulsed.

## 2013-01-13 ENCOUNTER — Ambulatory Visit (INDEPENDENT_AMBULATORY_CARE_PROVIDER_SITE_OTHER): Payer: Medicare Other | Admitting: Internal Medicine

## 2013-01-13 ENCOUNTER — Encounter: Payer: Self-pay | Admitting: Internal Medicine

## 2013-01-13 ENCOUNTER — Telehealth: Payer: Self-pay | Admitting: Internal Medicine

## 2013-01-13 VITALS — BP 128/82 | HR 71 | Ht 73.0 in | Wt 185.0 lb

## 2013-01-13 DIAGNOSIS — J8410 Pulmonary fibrosis, unspecified: Secondary | ICD-10-CM

## 2013-01-13 DIAGNOSIS — J849 Interstitial pulmonary disease, unspecified: Secondary | ICD-10-CM

## 2013-01-13 NOTE — Telephone Encounter (Signed)
Pt called back.  I advised pt that the appointment day for PFT & walk test on 01/20/13 @ 10:00 AM.  Pt has an appt w/ Dr. Hessie Dibble at 11:40.  Pt is to arrive at 9:30 AM.  I confirmed dates/times w/ pt.  Pt verbalized understanding & states nothing further needed at this time.  Antionette Fairy

## 2013-01-13 NOTE — Patient Instructions (Signed)
I think you have Rheumatoid ARthritis related Pulmonary Fibrosis Whether you have IPF, is a tough question to answer definitively I propose you see [facility] ILD clinic for specific opinion on this  - They will want CT Chest and PFT but we will see if they can do this all for  You on same day MEanwhile, continue your oxygen and medications REturn to see me in 2 months

## 2013-01-13 NOTE — Progress Notes (Signed)
Subjective:    Patient ID: Evan [name], male    DOB: 08-Feb-1931, 79 y.o.   MRN: [id number] PCP Katy Apo, MD  HPI  OV 01/13/2013  - 2nd opinion with [facility] local ILD person for question of Pirfenidone  79 year old who is marginal historian. REfered by Dr Maple Hudson. As best I can gather he has had dx of UIP/Emphysema on CT for  several years. Been on o2 4L [place] for few years. 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 He is on cellcept and with bactrim. His O2 needs have not subjectively gotten worse; objectively not checking. In fact at rest on RA he is 91% in office.  Subjecively progress with dsypnea (no cough) has been very slow even per his own history. Denies cough, cor pulmonale symptoms  HE is really keen on Esbriet/Ofev. Wants to add therapy to prolong life. HE is trying to rationalize that because RA developed years after ILD he has IPF  Note: he is not best historian. Lot of above infor gained from talking to Dr Maple Hudson, record review and emotional content of his body language  TESTS AVILABLE so far  IMPRESSION:  CT chest 07/09/12  Stable findings of moderate to severe COPD with subpleural  reticulation and honeycombing in a pattern typical for the provided  history of pulmonary fibrosis/UIP.  Original Report Authenticated By: Christiana Pellant, M.D.   AUtopimmune 03/26/08  - ANA negative  - RF 793.7  PFT 08/25/10  = FVC 3.18L/70%  - TLC 5.2/75%  - DLCO 6.9/34%  He has not had followup testing on PFT since 2012   Rheum Dr Corliss Skains note June 2014   - clearly notes he has RA of joints and has significant symptoms  Review of Systems  Constitutional: Negative for fever and unexpected weight change.  HENT: Negative for congestion, dental problem, ear pain, nosebleeds, postnasal drip, rhinorrhea, sinus pressure, sneezing, sore throat and trouble swallowing.   Eyes: Negative for redness and itching.  Respiratory:  Positive for shortness of breath. Negative for cough, chest tightness and wheezing.   Cardiovascular: Negative for palpitations and leg swelling.  Gastrointestinal: Negative for nausea and vomiting.  Genitourinary: Negative for dysuria.  Musculoskeletal: Negative for joint swelling.  Skin: Negative for rash.  Neurological: Negative for headaches.  Hematological: Does not bruise/bleed easily.  Psychiatric/Behavioral: Negative for dysphoric mood. The patient is not nervous/anxious.    Current outpatient prescriptions:aspirin 81 MG tablet, Take 81 mg by mouth daily.  , Disp: , Rfl: ;  Calcium Carbonate (CALCIUM 600) 1500 MG TABS, Take 1 tablet by mouth daily. , Disp: , Rfl: ;  losartan (COZAAR) 100 MG tablet, Take 1 tablet by mouth daily., Disp: , Rfl: ;  metoprolol (TOPROL-XL) 50 MG 24 hr tablet, Take 50 mg by mouth daily.  , Disp: , Rfl: ;  mirtazapine (REMERON) 30 MG tablet, Take 1 tablet by mouth at bedtime., Disp: , Rfl:  Multiple Vitamin (MULTIVITAMIN) tablet, Take 1 tablet by mouth daily., Disp: , Rfl: ;  mycophenolate (CELLCEPT) 500 MG tablet, Take 500 mg by mouth 2 (two) times daily., Disp: , Rfl: ;  NEXIUM 40 MG capsule, TAKE 1 CAPSULE BY MOUTH ONCE DAILY, Disp: 30 capsule, Rfl: 11;  QVAR 80 MCG/ACT inhaler, INHALE 2 PUFFS INTO THE LUNGS 2 (TWO) TIMES DAILY., Disp: 8.7 g, Rfl: 2;  simvastatin (ZOCOR) 40 MG tablet, Take 40 mg by mouth at bedtime.  , Disp: , Rfl:  SPIRIVA HANDIHALER 18 MCG inhalation capsule, INHALE CONTENTS OF ONE CAPSULE ONCE DAILY, Disp: 30 capsule, Rfl: 4;  sulfamethoxazole-trimethoprim (BACTRIM DS) 800-160 MG per tablet, 3 tablets per week, Disp: 12 tablet, Rfl: prn;  temazepam (RESTORIL) 15 MG capsule, 15 mg. Take one tablet daily at bedtime for sleep, Disp: , Rfl:       Objective:   Physical Exam  Nursing note and vitals reviewed. Constitutional: He is oriented to person, place, and time. He appears well-developed and well-nourished. No distress.  4LNC o2 but on RA at  rest he was 91%  HENT:  Head: Normocephalic and atraumatic.  Right Ear: External ear normal.  Left Ear: External ear normal.  Mouth/Throat: Oropharynx is clear and moist. No oropharyngeal exudate.  Eyes: Conjunctivae and EOM are normal. Pupils are equal, round, and reactive to light. Right eye exhibits no discharge. Left eye exhibits no discharge. No scleral icterus.  Neck: Normal range of motion. Neck supple. No JVD present. No tracheal deviation present. No thyromegaly present.  Cardiovascular: Normal rate, regular rhythm and intact distal pulses.  Exam reveals no gallop and no friction rub.   No murmur heard. Pulmonary/Chest: Effort normal. No respiratory distress. He has no wheezes. He has rales. He exhibits no tenderness.  Rales R > Left base  Abdominal: Soft. Bowel sounds are normal. He exhibits no distension and no mass. There is no tenderness. There is no rebound and no guarding.  Musculoskeletal: Normal range of motion. He exhibits no edema and no tenderness.  Clear RA on hand  Lymphadenopathy:    He has no cervical adenopathy.  Neurological: He is alert and oriented to person, place, and time. He has normal reflexes. No cranial nerve deficit. Coordination normal.  Skin: Skin is warm and dry. No rash noted. He is not diaphoretic. No erythema. No pallor.  Psychiatric: He has a normal mood and affect. His behavior is normal. Judgment and thought content normal.          Assessment & Plan:

## 2013-01-14 NOTE — Assessment & Plan Note (Signed)
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 I have explained to him that this is not ATS definition of the disease  - He left the visit disappointed  - I d/w Dr Maple Hudson referring pulmonary and felt that we could get another opinion from [facility]; so I have made referral. I did give a heads up to Dr Marcelene Butte via email  > 50% of this > 25 min visit spent in face to face counseling (15 min visit converted to 25 min)

## 2013-02-02 ENCOUNTER — Telehealth: Payer: Self-pay | Admitting: Internal Medicine

## 2013-02-02 DIAGNOSIS — J449 Chronic obstructive pulmonary disease, unspecified: Secondary | ICD-10-CM

## 2013-02-02 NOTE — Telephone Encounter (Signed)
LMTCB

## 2013-02-02 NOTE — Telephone Encounter (Signed)
Called and spoke with pt. He reports he went to [facility] on 01/20/13. He had several tests done with them and has not heard from them. These records are viewable in care everywhere. He is wanting to know if he is suppose to f/u with MR? Please advise thanks

## 2013-02-02 NOTE — Telephone Encounter (Signed)
Reviewed [facility] Notes by Dr Glynda Jaeger  He feels that patient ILD is related to RA (based on infromation I have provided and blood test he) and therefore patient is NOT a candidate for the new IPF drugs (ofev and esbriet)  He believes there might be a role to increase cellcept but wants patients old PFTs. He wanted to see rate of progression of ILD but the only PFT patient ever had ni our system was in 2012  So, please have aptient do new set of PFTs and see me in clinic; first avialble. Based on the new PFTs I can touch base with [facility] and adjust his cellcept  Dr. Kalman Shan, M.D., [facility].C.P Pulmonary and Critical Care Medicine Staff Physician [facility] Pager: [phone], If no answer or between  15:00h - 7:00h: call [phone]  02/02/2013 5:13 PM

## 2013-02-03 NOTE — Telephone Encounter (Signed)
Called and spoke with pt and he is aware of appt with MR on 2/10 and PFT has been scheduled for the pt at [facility] at 10 on 2/2.  Pt is aware of both appts. Nothing further is needed.

## 2013-02-10 ENCOUNTER — Other Ambulatory Visit: Payer: Self-pay | Admitting: Cardiology

## 2013-02-11 ENCOUNTER — Other Ambulatory Visit: Payer: Self-pay | Admitting: Cardiology

## 2013-02-14 ENCOUNTER — Other Ambulatory Visit: Payer: Self-pay | Admitting: Cardiology

## 2013-02-23 ENCOUNTER — Ambulatory Visit (HOSPITAL_COMMUNITY)
Admission: RE | Admit: 2013-02-23 | Discharge: 2013-02-23 | Disposition: A | Discharge status: Home / Self Care | Payer: Medicare Other | Source: Ambulatory Visit | Attending: Internal Medicine | Admitting: Internal Medicine

## 2013-02-23 DIAGNOSIS — J4489 Other specified chronic obstructive pulmonary disease: Secondary | ICD-10-CM | POA: Insufficient documentation

## 2013-02-23 DIAGNOSIS — Z87891 Personal history of nicotine dependence: Secondary | ICD-10-CM | POA: Insufficient documentation

## 2013-02-23 DIAGNOSIS — J449 Chronic obstructive pulmonary disease, unspecified: Secondary | ICD-10-CM | POA: Insufficient documentation

## 2013-02-23 LAB — PULMONARY FUNCTION TEST
DL/VA % pred: 34 %
DL/VA: 1.64 ml/min/mmHg/L
DLCO unc % pred: 19 %
DLCO unc: 6.94 ml/min/mmHg
FEF 25-75 Post: 2.09 L/sec
FEF 25-75 Pre: 1.9 L/sec
FEF2575-%Change-Post: 9 %
FEF2575-%PRED-POST: 91 %
FEF2575-%Pred-Pre: 82 %
FEV1-%CHANGE-POST: 1 %
FEV1-%PRED-POST: 74 %
FEV1-%Pred-Pre: 72 %
FEV1-POST: 2.43 L
FEV1-PRE: 2.39 L
FEV1FVC-%CHANGE-POST: 2 %
FEV1FVC-%PRED-PRE: 105 %
FEV6-%Change-Post: 0 %
FEV6-%PRED-PRE: 72 %
FEV6-%Pred-Post: 72 %
FEV6-Post: 3.11 L
FEV6-Pre: 3.12 L
FEV6FVC-%Change-Post: 0 %
FEV6FVC-%Pred-Post: 106 %
FEV6FVC-%Pred-Pre: 105 %
FVC-%CHANGE-POST: 0 %
FVC-%Pred-Post: 68 %
FVC-%Pred-Pre: 68 %
FVC-PRE: 3.14 L
FVC-Post: 3.12 L
PRE FEV6/FVC RATIO: 99 %
Post FEV1/FVC ratio: 78 %
Post FEV6/FVC ratio: 100 %
Pre FEV1/FVC ratio: 76 %
RV % PRED: 76 %
RV: 2.15 L
TLC % pred: 69 %
TLC: 5.3 L

## 2013-02-23 MED ORDER — ALBUTEROL SULFATE (2.5 MG/3ML) 0.083% IN NEBU
2.5000 mg | INHALATION_SOLUTION | Freq: Once | RESPIRATORY_TRACT | Status: AC
  Administered 2013-02-23: 2.5 mg via RESPIRATORY_TRACT

## 2013-03-03 ENCOUNTER — Other Ambulatory Visit (INDEPENDENT_AMBULATORY_CARE_PROVIDER_SITE_OTHER): Payer: Medicare Other

## 2013-03-03 ENCOUNTER — Encounter: Payer: Self-pay | Admitting: Internal Medicine

## 2013-03-03 ENCOUNTER — Ambulatory Visit (INDEPENDENT_AMBULATORY_CARE_PROVIDER_SITE_OTHER): Payer: Medicare Other | Admitting: Internal Medicine

## 2013-03-03 VITALS — BP 142/92 | HR 86 | Ht 73.0 in | Wt 188.6 lb

## 2013-03-03 DIAGNOSIS — J849 Interstitial pulmonary disease, unspecified: Secondary | ICD-10-CM

## 2013-03-03 DIAGNOSIS — Z79899 Other long term (current) drug therapy: Secondary | ICD-10-CM

## 2013-03-03 DIAGNOSIS — R911 Solitary pulmonary nodule: Secondary | ICD-10-CM

## 2013-03-03 DIAGNOSIS — J8410 Pulmonary fibrosis, unspecified: Secondary | ICD-10-CM

## 2013-03-03 LAB — CBC WITH DIFFERENTIAL/PLATELET
Basophils Absolute: 0 10*3/uL (ref 0.0–0.1)
Basophils Relative: 0.5 % (ref 0.0–3.0)
Eosinophils Absolute: 0.2 10*3/uL (ref 0.0–0.7)
Eosinophils Relative: 3.2 % (ref 0.0–5.0)
HCT: 35.5 % — ABNORMAL LOW (ref 39.0–52.0)
Hemoglobin: 11.5 g/dL — ABNORMAL LOW (ref 13.0–17.0)
Lymphocytes Relative: 14.9 % (ref 12.0–46.0)
Lymphs Abs: 1 10*3/uL (ref 0.7–4.0)
MCHC: 32.3 g/dL (ref 30.0–36.0)
MCV: 94.2 fl (ref 78.0–100.0)
Monocytes Absolute: 0.6 10*3/uL (ref 0.1–1.0)
Monocytes Relative: 9.1 % (ref 3.0–12.0)
Neutro Abs: 4.8 10*3/uL (ref 1.4–7.7)
Neutrophils Relative %: 72.3 % (ref 43.0–77.0)
Platelets: 213 10*3/uL (ref 150.0–400.0)
RBC: 3.77 Mil/uL — ABNORMAL LOW (ref 4.22–5.81)
RDW: 13.5 % (ref 11.5–14.6)
WBC: 6.6 10*3/uL (ref 4.5–10.5)

## 2013-03-03 LAB — BASIC METABOLIC PANEL
BUN: 22 mg/dL (ref 6–23)
CO2: 26 mEq/L (ref 19–32)
Calcium: 8.9 mg/dL (ref 8.4–10.5)
Chloride: 107 mEq/L (ref 96–112)
Creatinine, Ser: 1.7 mg/dL — ABNORMAL HIGH (ref 0.4–1.5)
GFR: 40.39 mL/min — ABNORMAL LOW (ref >60.00)
Glucose, Bld: 108 mg/dL — ABNORMAL HIGH (ref 70–99)
Potassium: 4.3 mEq/L (ref 3.5–5.1)
Sodium: 140 mEq/L (ref 135–145)

## 2013-03-03 LAB — HEPATIC FUNCTION PANEL
ALT: 18 U/L (ref 0–53)
AST: 17 U/L (ref 0–37)
Albumin: 4.1 g/dL (ref 3.5–5.2)
Alkaline Phosphatase: 78 U/L (ref 39–117)
Bilirubin, Direct: 0 mg/dL (ref 0.0–0.3)
Total Bilirubin: 0.3 mg/dL (ref 0.3–1.2)
Total Protein: 6.7 g/dL (ref 6.0–8.3)

## 2013-03-03 NOTE — Patient Instructions (Signed)
#  Pulmonary fibrosis -Breathing test points to continued stability - Have blood test for CBC, chemistry, liver function; will call with results to decide the next blood test -Continue CellCept at 1000 mg twice daily  - Make sure you use sunscreen and wear a hat when you're taking this medication - Continue oxygen as before - CMA will check that if you are eligible for pneumonia vaccine called Prevnar  #Lung nodule 6 mm right upper lobe - Repeat CT scan of the chest without contrast in 6 months  #Followup - 6 months with repeat CT scan of the chest

## 2013-03-03 NOTE — Progress Notes (Signed)
Subjective:    Patient ID: Evan [name], male    DOB: 1931-05-16, 80 y.o.   MRN: [id number]  HPI   OV 01/13/2013  - 2nd opinion with [facility] local ILD person for question of Pirfenidone  reports that he quit smoking about 28 years ago. His smoking use included Cigarettes. He has a 80 pack-year smoking history. He has never used smokeless tobacco.   80 year old who is marginal historian. REfered by Dr Maple Hudson. As best I can gather he has had dx of UIP/Emphysema on CT for  several years. Been on o2 4L [place] for few years. 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 He is on cellcept and with bactrim. His O2 needs have not subjectively gotten worse; objectively not checking. In fact at rest on RA he is 91% in office.  Subjecively progress with dsypnea (no cough) has been very slow even per his own history. Denies cough, cor pulmonale symptoms  HE is really keen on Esbriet/Ofev. Wants to add therapy to prolong life. HE is trying to rationalize that because RA developed years after ILD he has IPF  Note: he is not best historian. Lot of above infor gained from talking to Dr Maple Hudson, record review and emotional content of his body language  TESTS AVILABLE so far  IMPRESSION:  CT chest 07/09/12  Stable findings of moderate to severe COPD with subpleural  reticulation and honeycombing in a pattern typical for the provided  history of pulmonary fibrosis/UIP.  Original Report Authenticated By: Christiana Pellant, M.D.   AUtopimmune 03/26/08  - ANA negative  - RF 793.7  PFT 08/25/10  = FVC 3.18L/70%  - TLC 5.2/75%  - DLCO 6.9/34%  He has not had followup testing on PFT since 2012   Rheum Dr Corliss Skains note June 2014   - clearly notes he has RA of joints and has significant symptoms  REC Refer [facility] to get [facility]rd opinion on Esbriet/OFEV  OV 03/03/2013   Followup from [facility] visit. He saw Dr. Hessie Dibble at the [facility] clinic. He had extensive workup all listed below. He had pulmonary function test that basically did not show any change compared to 2012. His autoimmune profile was repeated and showed elevated CCP. Did have a repeat CT scan of the chest that showed persistence of UIP pattern but also new right upper lobe 6 mm lung nodule. All these are detailed below. Was counseled that he was not a candidate for idiopathic pulmonary fibrosis new pharmaceutical therapy. Instead his CellCept was increased to 1000 mg twice daily. He is tolerating this drug well and the increase dose. Is not on any prednisone. Has not had any followup blood work and will have this today. He is reporting no worsening in symptoms. He did have prominent in test on 02/23/2013 is a followup to the increase CellCept and this shows continued stability. All below are reviewed in detail. Of note, he attended pulmonary rehabilitation a few years ago and did not find it of any help. We do not think he is up-to-date with Prevnar and we'll decide on giving this today  [facility] autoimmune 01/20/13 : ccp > 300  6 minute walk test 01/20/2013 at [facility]: He maintain oxygen saturation above 88% on 8 L per minute for 2 minutes. Resting heart rate was 78. Maximum heart rate was 126. End of test \\r  saturation was 89%. Perceived dyspnea scale was 3  Pulmonary function test at [facility] 01/20/2013 showed FVC 3.14 L/66%. FEV1 2.28 L/64%. Ratio 73. Total lung capacity 4.7 L/62%. DLCO.  7.5/31%.  CT chest 01/20/13 [facility]  -  Impression:  1. Findings of pulmonary fibrosis in a pattern consistent with UIP.  2. Severe centrilobular emphysema.  3. 6 mm right upper lobe nodule. Recommend comparisons with priors, if  available. If no priors are available, a follow up CT in 6-12 months is  recommended.  4. Nonobstructive right intrarenal calculus.  Electronically Reviewed by: Mariann Laster, MD  Electronically Reviewed on: 01/20/2013 4:10 PM     Pulmonary function test 02/23/2013 at [facility]: FVC 3.14 L or 68%. FEV1 2.39L or 72%. Ratio 72/105%. Total lung capacity 5.3/69%. DLCO 6.9/19%   Review of Systems  Constitutional: Negative for fever and unexpected weight change.  HENT: Negative for congestion, dental problem, ear pain, nosebleeds, postnasal drip, rhinorrhea, sinus pressure, sneezing, sore throat and trouble swallowing.   Eyes: Negative for redness and itching.  Respiratory: Positive for shortness of breath. Negative for cough, chest tightness and wheezing.   Cardiovascular: Negative for chest pain, palpitations and leg swelling.  Gastrointestinal: Negative for nausea and vomiting.  Genitourinary: Negative for dysuria.  Musculoskeletal: Negative for joint swelling.  Skin: Negative for rash.  Neurological: Negative for headaches.  Hematological: Does not bruise/bleed easily.  Psychiatric/Behavioral: Negative for dysphoric mood. The patient is not nervous/anxious.    Current outpatient prescriptions:aspirin 81 MG tablet, Take 81 mg by mouth daily.  , Disp: , Rfl: ;  Calcium Carbonate (CALCIUM 600) 1500 MG TABS, Take 1 tablet by mouth daily. , Disp: , Rfl: ;  losartan (COZAAR) 100 MG tablet, Take 1 tablet by mouth daily., Disp: , Rfl: ;  metoprolol succinate (TOPROL-XL) 50 MG 24 hr tablet, TAKE 1 TABLET BY MOUTH ONCE A DAY, Disp: 30 tablet, Rfl: 4 mirtazapine (REMERON) 30 MG tablet, Take 1 tablet by mouth at bedtime., Disp: , Rfl: ;  Multiple Vitamin (MULTIVITAMIN) tablet, Take 1 tablet by mouth daily., Disp: , Rfl: ;  mycophenolate (CELLCEPT) 500 MG tablet, Take 1,000 mg by mouth 2 (two) times daily. , Disp: , Rfl: ;  NEXIUM 40 MG capsule, TAKE 1 CAPSULE BY MOUTH ONCE DAILY, Disp: 30 capsule, Rfl: 11 QVAR 80 MCG/ACT inhaler, INHALE 2 PUFFS INTO THE LUNGS 2 (TWO) TIMES DAILY., Disp: 8.7 g, Rfl: 2;  simvastatin (ZOCOR) 40 MG tablet, Take 40 mg by mouth at bedtime.  , Disp: , Rfl: ;  SPIRIVA HANDIHALER 18 MCG  inhalation capsule, INHALE CONTENTS OF ONE CAPSULE ONCE DAILY, Disp: 30 capsule, Rfl: 4;  sulfamethoxazole-trimethoprim (BACTRIM DS) 800-160 MG per tablet, 3 tablets per week, Disp: 12 tablet, Rfl: prn temazepam (RESTORIL) 15 MG capsule, 15 mg. Take one tablet daily at bedtime for sleep, Disp: , Rfl:       Objective:   Physical Exam  Nursing note and vitals reviewed. Constitutional: He is oriented to person, place, and time. He appears well-developed and well-nourished. No distress.  HENT:  Head: Normocephalic and atraumatic.  Right Ear: External ear normal.  Left Ear: External ear normal.  Mouth/Throat: Oropharynx is clear and moist. No oropharyngeal exudate.  High nasal cannula 02. Suspect one nostril right side is blocked due to trauma as a teen playing football  Eyes: Conjunctivae and EOM are normal. Pupils are equal, round, and reactive to light. Right eye exhibits no discharge. Left eye exhibits no discharge. No scleral icterus.  Neck: Normal range of motion. Neck supple.  No JVD present. No tracheal deviation present. No thyromegaly present.  Cardiovascular: Normal rate, regular rhythm and intact distal pulses.  Exam reveals no gallop and no friction rub.   No murmur heard. Pulmonary/Chest: Effort normal. No respiratory distress. He has no wheezes. He has rales. He exhibits no tenderness.  Crackles at base  Abdominal: Soft. Bowel sounds are normal. He exhibits no distension and no mass. There is no tenderness. There is no rebound and no guarding.  Musculoskeletal: Normal range of motion. He exhibits no edema and no tenderness.  Lymphadenopathy:    He has no cervical adenopathy.  Neurological: He is alert and oriented to person, place, and time. He has normal reflexes. No cranial nerve deficit. Coordination normal.  Skin: Skin is warm and dry. No rash noted. He is not diaphoretic. No erythema. No pallor.  Psychiatric: He has a normal mood and affect. His behavior is normal. Judgment  and thought content normal.  Seems more accepting he has RA-ILD          Assessment & Plan:

## 2013-03-07 ENCOUNTER — Other Ambulatory Visit: Payer: Self-pay | Admitting: Internal Medicine

## 2013-03-09 ENCOUNTER — Ambulatory Visit: Payer: Medicare Other | Admitting: Internal Medicine

## 2013-03-14 DIAGNOSIS — R911 Solitary pulmonary nodule: Secondary | ICD-10-CM | POA: Insufficient documentation

## 2013-03-14 DIAGNOSIS — Z79899 Other long term (current) drug therapy: Secondary | ICD-10-CM | POA: Insufficient documentation

## 2013-03-14 NOTE — Assessment & Plan Note (Signed)
#  Pulmonary fibrosis -Breathing test points to continued stability - Have blood test for CBC, chemistry, liver function; will call with results to decide the next blood test -Continue CellCept at 1000 mg twice daily  - Make sure you use sunscreen and wear a hat when you're taking this medication - Continue oxygen as before - CMA will check that if you are eligible for pneumonia vaccine called Prevnar   #Followup - 6 months with repeat CT scan of the chest

## 2013-03-14 NOTE — Assessment & Plan Note (Signed)
#  Lung nodule 6 mm right upper lobe - Repeat CT scan of the chest without contrast in 6 months

## 2013-03-17 ENCOUNTER — Telehealth: Payer: Self-pay | Admitting: Internal Medicine

## 2013-03-17 NOTE — Telephone Encounter (Signed)
Labs 03/03/13 ok excdept creat bump ip t 1.7gm% with gfr at 40. I am seeing him tomorrow. If he does not show up order cbc, bmet

## 2013-03-18 ENCOUNTER — Telehealth: Payer: Self-pay | Admitting: Internal Medicine

## 2013-03-18 ENCOUNTER — Ambulatory Visit (INDEPENDENT_AMBULATORY_CARE_PROVIDER_SITE_OTHER): Payer: Medicare Other | Admitting: Internal Medicine

## 2013-03-18 ENCOUNTER — Other Ambulatory Visit (INDEPENDENT_AMBULATORY_CARE_PROVIDER_SITE_OTHER): Payer: Medicare Other

## 2013-03-18 ENCOUNTER — Ambulatory Visit (INDEPENDENT_AMBULATORY_CARE_PROVIDER_SITE_OTHER)
Admission: RE | Admit: 2013-03-18 | Discharge: 2013-03-18 | Disposition: A | Discharge status: Home / Self Care | Payer: Medicare Other | Source: Ambulatory Visit | Attending: Internal Medicine | Admitting: Internal Medicine

## 2013-03-18 VITALS — BP 108/60 | HR 75 | Temp 97.2°F | Ht 73.0 in | Wt 179.4 lb

## 2013-03-18 DIAGNOSIS — Z79899 Other long term (current) drug therapy: Secondary | ICD-10-CM

## 2013-03-18 DIAGNOSIS — R0602 Shortness of breath: Secondary | ICD-10-CM

## 2013-03-18 DIAGNOSIS — J8410 Pulmonary fibrosis, unspecified: Secondary | ICD-10-CM

## 2013-03-18 DIAGNOSIS — N189 Chronic kidney disease, unspecified: Secondary | ICD-10-CM

## 2013-03-18 DIAGNOSIS — J849 Interstitial pulmonary disease, unspecified: Secondary | ICD-10-CM

## 2013-03-18 DIAGNOSIS — N289 Disorder of kidney and ureter, unspecified: Secondary | ICD-10-CM

## 2013-03-18 LAB — HEPATIC FUNCTION PANEL
ALBUMIN: 4 g/dL (ref 3.5–5.2)
ALT: 16 U/L (ref 0–53)
AST: 20 U/L (ref 0–37)
Alkaline Phosphatase: 99 U/L (ref 39–117)
Bilirubin, Direct: 0 mg/dL (ref 0.0–0.3)
TOTAL PROTEIN: 6.9 g/dL (ref 6.0–8.3)
Total Bilirubin: 0.4 mg/dL (ref 0.3–1.2)

## 2013-03-18 LAB — CBC WITH DIFFERENTIAL/PLATELET
Basophils Absolute: 0 10*3/uL (ref 0.0–0.1)
Basophils Relative: 0.2 % (ref 0.0–3.0)
EOS PCT: 2.3 % (ref 0.0–5.0)
Eosinophils Absolute: 0.1 10*3/uL (ref 0.0–0.7)
HCT: 34.1 % — ABNORMAL LOW (ref 39.0–52.0)
Hemoglobin: 11.1 g/dL — ABNORMAL LOW (ref 13.0–17.0)
Lymphocytes Relative: 11.7 % — ABNORMAL LOW (ref 12.0–46.0)
Lymphs Abs: 0.7 10*3/uL (ref 0.7–4.0)
MCHC: 32.6 g/dL (ref 30.0–36.0)
MCV: 93.5 fl (ref 78.0–100.0)
MONOS PCT: 10.6 % (ref 3.0–12.0)
Monocytes Absolute: 0.7 10*3/uL (ref 0.1–1.0)
NEUTROS PCT: 75.2 % (ref 43.0–77.0)
Neutro Abs: 4.6 10*3/uL (ref 1.4–7.7)
PLATELETS: 295 10*3/uL (ref 150.0–400.0)
RBC: 3.65 Mil/uL — ABNORMAL LOW (ref 4.22–5.81)
RDW: 13.3 % (ref 11.5–14.6)
WBC: 6.2 10*3/uL (ref 4.5–10.5)

## 2013-03-18 LAB — BASIC METABOLIC PANEL
BUN: 27 mg/dL — AB (ref 6–23)
CO2: 24 mEq/L (ref 19–32)
Calcium: 8.8 mg/dL (ref 8.4–10.5)
Chloride: 106 mEq/L (ref 96–112)
Creatinine, Ser: 1.9 mg/dL — ABNORMAL HIGH (ref 0.4–1.5)
GFR: 37.16 mL/min — ABNORMAL LOW (ref >60.00)
GLUCOSE: 94 mg/dL (ref 70–99)
POTASSIUM: 4.6 meq/L (ref 3.5–5.1)
Sodium: 137 mEq/L (ref 135–145)

## 2013-03-18 MED ORDER — PREDNISONE 10 MG PO TABS: ORAL_TABLET | ORAL | Status: DC

## 2013-03-18 MED ORDER — LEVOFLOXACIN 500 MG PO TABS: 500.0000 mg | ORAL_TABLET | Freq: Every day | ORAL | Status: DC

## 2013-03-18 NOTE — Patient Instructions (Addendum)
I thnk you might be reacting to high dose of cellcept IN addition, you likely have acute bronchitis versus pneumonia  Do CXR 2 view, cbcd, bmet, lft test; will call with results Cut down on cellcept to 500mg twice daily; if labs bad will tell you to stop this  take levaquin 500mg once daily  X 6 days Take prednisone 40 mg daily x 2 days, then 20mg daily x 2 days, then 10mg daily x 2 days, then 5mg daily x 2 days and stop   ROV 3 weeks with me or my NP to assess progress  - might need repeat lab work then   .............. Update 4:03 PM, 03/19/2013 Creatinine 1.9mg% - advised to stop cellcept, repeat labs in 1 weeks esp cbc, bmet,. He is agreeable 

## 2013-03-18 NOTE — Telephone Encounter (Signed)
ddd

## 2013-03-18 NOTE — Progress Notes (Signed)
Subjective:    Patient ID: Evan [name], male    DOB: [birth year], 80 y.o.   MRN: [id number]  HPI    OV 01/13/2013  - 2nd opinion with Evan [name] local ILD person for question of Pirfenidone  reports that he quit smoking about 28 years ago. His smoking use included Cigarettes. He has a 80 pack-year smoking history. He has never used smokeless tobacco.   80 year old who is marginal historian. REfered by Dr Evan [name]. As best I can gather he has had dx of UIP/Emphysema on CT for  several years. Been on o2 4L [place] for few years. 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 He is on cellcept and with bactrim. His O2 needs have not subjectively gotten worse; objectively not checking. In fact at rest on RA he is 91% in office.  Subjecively progress with dsypnea (no cough) has been very slow even per his own history. Denies cough, cor pulmonale symptoms  HE is really keen on Esbriet/Ofev. Wants to add therapy to prolong life. HE is trying to rationalize that because RA developed years after ILD he has IPF  Note: he is not best historian. Lot of above infor gained from talking to Dr Evan [name], record review and emotional content of his body language  TESTS AVILABLE so far  IMPRESSION:  CT chest 07/09/12  Stable findings of moderate to severe COPD with subpleural  reticulation and honeycombing in a pattern typical for the provided  history of pulmonary fibrosis/UIP.  Original Report Authenticated By: Evan [name], M.D.   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 [name]. He had extensive workup all listed below. He had pulmonary function test that basically did not show any change compared to 2012. His autoimmune profile was repeated and showed elevated CCP. Did have a repeat CT scan of the chest that showed persistence of UIP pattern but also new right upper lobe 6 mm lung nodule. All these are detailed below. Was counseled that he was not a candidate for idiopathic pulmonary fibrosis new pharmaceutical therapy. Instead his CellCept was increased to 1000 mg twice daily. He is tolerating this drug well and the increase dose. Is not on any prednisone. Has not had any followup blood work and will have this today. He is reporting no worsening in symptoms. He did have prominent in test on 02/23/2013 is a followup to the increase CellCept and this shows continued stability. All below are reviewed in detail. Of note, he attended pulmonary rehabilitation a few years ago and did not find it of any help. We do not think he is up-to-date with Prevnar and we'll decide on giving this today  [facility] autoimmune 01/20/13 : ccp > 300  6 minute walk test 01/20/2013 at [facility]: He maintain oxygen saturation above 88% on 8 L per minute for 2 minutes. Resting heart rate was 78. Maximum heart rate was 126. End of test \\r  saturation was 89%. Perceived dyspnea scale was  3  Pulmonary function test at [facility] 01/20/2013 showed FVC 3.14 L/66%. FEV1 2.28 L/64%. Ratio 73. Total lung capacity 4.7 L/62%. DLCO.  7.5/31%.  CT chest 01/20/13 [facility]  -  Impression:  1. Findings of pulmonary fibrosis in a pattern consistent with UIP.  2. Severe centrilobular emphysema.  3. 6 mm right upper lobe nodule. Recommend comparisons with priors, if  available. If no priors are available, a follow up CT in 6-12 months is  recommended.  4. Nonobstructive right intrarenal calculus.  Electronically Reviewed by: Evan Laster, MD  Electronically Reviewed on: 01/20/2013 4:10 PM     Pulmonary function test 02/23/2013 at [facility]: FVC 3.14 L or 68%. FEV1 2.39L or 72%. Ratio 72/105%. Total lung capacity 5.3/69%. DLCO 6.9/19%  REC #Pulmonary fibrosis -Breathing test points to continued stability - Have blood test for CBC, chemistry, liver function; will call with results to decide the next blood test -Continue CellCept at 1000 mg twice daily  - Make sure you use sunscreen and wear a hat when you're taking this medication - Continue oxygen as before - CMA will check that if you are eligible for pneumonia vaccine called Prevnar  #Lung nodule 6 mm right upper lobe - Repeat CT scan of the chest without contrast in 6 months  #Followup - 6 months with repeat CT scan of the chest   OV 03/18/2013  Chief Complaint  Patient presents with  . Follow-up    F/U per DR Evan [name] - Repeat labs - Prod cough (yellow- clear) - Increased SOB since last ov   Acute visit. Past 3-4 weeks, since increasing his CellCept dosage, he did have a general feeling of tiredness along with a sensation that he is flu but no actual fevers or subjective fevers or chills. Symptoms are moderate in severity. It is associated with worsening cough associated with white and sometimes yellow sputum. The cough and sputum are new for him. There is associated worsening dyspnea on exertion though not necessarily change in oxygen usage. There is no associated edema, weight loss, chills, hemoptysis  He is here with his wife  Labs from 03/03/2013 on the higher dose CellCept reviewed and shows a rising creatinine which could reflect CellCept toxicity  Labs   Recent Labs Lab 03/18/13 1405  HGB 11.1*  HCT 34.1*  WBC 6.2  PLT 295.0    Recent Labs Lab 03/18/13 1405  NA 137  K 4.6  CL 106  CO2 24  GLUCOSE 94  BUN 27*  CREATININE 1.9*  CALCIUM 8.8  creatinine rising was 1.7mg % on 03/03/13    Recent Labs Lab 03/18/13 1405  AST 20  ALT 16  ALKPHOS 99  BILITOT 0.4  PROT 6.9   ALBUMIN 4.0     Review of Systems  Constitutional: Negative for fever, chills, diaphoresis, activity change, appetite change, fatigue and unexpected weight change.  HENT: Positive for congestion and rhinorrhea. Negative for dental problem, ear discharge, ear pain, facial swelling, hearing loss, mouth sores, nosebleeds, postnasal drip, sinus pressure, sneezing, sore throat, tinnitus, trouble swallowing and voice change.   Eyes: Negative for photophobia, discharge, itching and visual disturbance.  Respiratory: Positive for cough, shortness of breath and wheezing. Negative for apnea, choking, chest tightness and stridor.   Cardiovascular: Negative for chest pain, palpitations and leg swelling.  Gastrointestinal: Negative for nausea, vomiting, abdominal pain, constipation, blood in stool and abdominal distention.  Genitourinary: Negative for dysuria, urgency, frequency, hematuria, flank pain, decreased urine volume and difficulty urinating.  Musculoskeletal: Negative for arthralgias, back pain, gait problem, joint swelling, myalgias, neck pain and neck stiffness.  Skin: Negative for color change, pallor and rash.  Neurological: Negative for dizziness, tremors, seizures, syncope, speech difficulty, weakness, light-headedness, numbness and headaches.  Hematological: Negative for adenopathy. Does not bruise/bleed easily.  Psychiatric/Behavioral: Negative for confusion, sleep disturbance and agitation. The patient is not nervous/anxious.        Objective:   Physical Exam   ENT:  Head: Normocephalic and atraumatic.  Right Ear: External ear normal.  Left Ear: External ear normal.  Mouth/Throat: Oropharynx is clear and moist. No oropharyngeal exudate.  High nasal cannula 02. Suspect one nostril right side is blocked due to trauma as a teen playing football  Eyes: Conjunctivae and EOM are normal. Pupils are equal, round, and reactive to light. Right eye exhibits no discharge. Left eye exhibits no  discharge. No scleral icterus.  Neck: Normal range of motion. Neck supple. No JVD present. No tracheal deviation present. No thyromegaly present.  Cardiovascular: Normal rate, regular rhythm and intact distal pulses.  Exam reveals no gallop and no friction rub.   No murmur heard. Pulmonary/Chest: Effort normal. No respiratory distress. He has no wheezes. He has rales. He exhibits no tenderness.  Crackles at base . Esp R > L base; ? This is new Abdominal: Soft. Bowel sounds are normal. He exhibits no distension and no mass. There is no tenderness. There is no rebound and no guarding.  Musculoskeletal: Normal range of motion. He exhibits no edema and no tenderness.  Lymphadenopathy:    He has no cervical adenopathy.  Neurological: He is alert and oriented to person, place, and time. He has normal reflexes. No cranial nerve deficit. Coordination normal.  Skin: Skin is warm and dry. No rash noted. He is not diaphoretic. No erythema. No pallor.  Psychiatric: He has a normal mood and affect. His behavior is normal. Judgment and thought content normal.  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

## 2013-03-18 NOTE — Telephone Encounter (Signed)
Pt came in for OV.Evan [name], CMA

## 2013-03-19 ENCOUNTER — Telehealth: Payer: Self-pay | Admitting: Internal Medicine

## 2013-03-19 ENCOUNTER — Encounter: Payer: Self-pay | Admitting: Internal Medicine

## 2013-03-19 DIAGNOSIS — N289 Disorder of kidney and ureter, unspecified: Secondary | ICD-10-CM

## 2013-03-19 DIAGNOSIS — N189 Chronic kidney disease, unspecified: Secondary | ICD-10-CM | POA: Insufficient documentation

## 2013-03-19 DIAGNOSIS — J449 Chronic obstructive pulmonary disease, unspecified: Secondary | ICD-10-CM

## 2013-03-19 NOTE — Assessment & Plan Note (Signed)
I thnk you might be reacting to high dose of cellcept IN addition, you likely have acute bronchitis versus pneumonia  Do CXR 2 view, cbcd, bmet, lft test; will call with results Cut down on cellcept to 500mg twice daily; if labs bad will tell you to stop this  take levaquin 500mg once daily  X 6 days Take prednisone 40 mg daily x 2 days, then 20mg daily x 2 days, then 10mg daily x 2 days, then 5mg daily x 2 days and stop   ROV 3 weeks with me or my NP to assess progress  - might need repeat lab work then   .............. Update 4:03 PM, 03/19/2013 Creatinine 1.9mg% - advised to stop cellcept, repeat labs in 1 weeks esp cbc, bmet,. He is agreeable 

## 2013-03-19 NOTE — Assessment & Plan Note (Signed)
I thnk you might be reacting to high dose of cellcept IN addition, you likely have acute bronchitis versus pneumonia  Do CXR 2 view, cbcd, bmet, lft test; will call with results Cut down on cellcept to 500mg  twice daily; if labs bad will tell you to stop this  take levaquin 500mg  once daily  X 6 days Take prednisone 40 mg daily x 2 days, then 20mg  daily x 2 days, then 10mg  daily x 2 days, then 5mg  daily x 2 days and stop   ROV 3 weeks with me or my NP to assess progress  - might need repeat lab work then   .............. Update 4:03 PM, 03/19/2013 Creatinine 1.9mg % - advised to stop cellcept, repeat labs in 1 weeks esp cbc, bmet,. He is agreeable

## 2013-03-19 NOTE — Telephone Encounter (Signed)
Order  bmet in 1 week from 03/19/2013: next Thursday and cbc as well. Give him instructions to do it

## 2013-03-19 NOTE — Assessment & Plan Note (Signed)
New and getting worse. Temporally correlates with increasing cellcept dose  Suspect cellcept toxicity  Plan Dc cellcept Recheck bmet in 1 week

## 2013-03-20 ENCOUNTER — Other Ambulatory Visit: Payer: Self-pay | Admitting: Internal Medicine

## 2013-03-20 MED ORDER — BECLOMETHASONE DIPROPIONATE 80 MCG/ACT IN AERS: INHALATION_SPRAY | RESPIRATORY_TRACT | Status: DC

## 2013-03-20 NOTE — Telephone Encounter (Signed)
Rx sent electronically to pharmacy on file.  

## 2013-03-20 NOTE — Telephone Encounter (Signed)
Pt advised and order placed. Jennifer Castillo, CMA  

## 2013-03-27 ENCOUNTER — Other Ambulatory Visit: Payer: Self-pay | Admitting: Internal Medicine

## 2013-03-27 ENCOUNTER — Other Ambulatory Visit (INDEPENDENT_AMBULATORY_CARE_PROVIDER_SITE_OTHER): Payer: Medicare Other

## 2013-03-27 DIAGNOSIS — Z79899 Other long term (current) drug therapy: Secondary | ICD-10-CM

## 2013-03-27 DIAGNOSIS — J449 Chronic obstructive pulmonary disease, unspecified: Secondary | ICD-10-CM

## 2013-03-27 LAB — BASIC METABOLIC PANEL
BUN: 19 mg/dL (ref 6–23)
CALCIUM: 8.6 mg/dL (ref 8.4–10.5)
CO2: 24 mEq/L (ref 19–32)
Chloride: 109 mEq/L (ref 96–112)
Creatinine, Ser: 1.6 mg/dL — ABNORMAL HIGH (ref 0.4–1.5)
GFR: 44.49 mL/min — AB (ref >60.00)
Glucose, Bld: 113 mg/dL — ABNORMAL HIGH (ref 70–99)
Potassium: 4.5 mEq/L (ref 3.5–5.1)
SODIUM: 142 meq/L (ref 135–145)

## 2013-04-02 ENCOUNTER — Other Ambulatory Visit: Payer: Self-pay | Admitting: Critical Care Medicine

## 2013-04-08 ENCOUNTER — Encounter: Payer: Self-pay | Admitting: Internal Medicine

## 2013-04-08 ENCOUNTER — Ambulatory Visit (INDEPENDENT_AMBULATORY_CARE_PROVIDER_SITE_OTHER): Payer: Medicare Other | Admitting: Internal Medicine

## 2013-04-08 ENCOUNTER — Other Ambulatory Visit (INDEPENDENT_AMBULATORY_CARE_PROVIDER_SITE_OTHER): Payer: Medicare Other

## 2013-04-08 VITALS — BP 122/78 | HR 92 | Wt 186.0 lb

## 2013-04-08 DIAGNOSIS — J849 Interstitial pulmonary disease, unspecified: Secondary | ICD-10-CM

## 2013-04-08 DIAGNOSIS — J8410 Pulmonary fibrosis, unspecified: Secondary | ICD-10-CM

## 2013-04-08 DIAGNOSIS — Z79899 Other long term (current) drug therapy: Secondary | ICD-10-CM

## 2013-04-08 LAB — BASIC METABOLIC PANEL
BUN: 25 mg/dL — AB (ref 6–23)
CHLORIDE: 106 meq/L (ref 96–112)
CO2: 27 mEq/L (ref 19–32)
Calcium: 9.1 mg/dL (ref 8.4–10.5)
Creatinine, Ser: 1.9 mg/dL — ABNORMAL HIGH (ref 0.4–1.5)
GFR: 36.93 mL/min — ABNORMAL LOW (ref >60.00)
Glucose, Bld: 102 mg/dL — ABNORMAL HIGH (ref 70–99)
POTASSIUM: 4.9 meq/L (ref 3.5–5.1)
SODIUM: 141 meq/L (ref 135–145)

## 2013-04-08 NOTE — Patient Instructions (Signed)
Will call you with lab results and if ok will have you restart cellcept 500mg  twice daily Look at [facility] oxygen system  Followup Based on lab results

## 2013-04-08 NOTE — Progress Notes (Signed)
Subjective:    Patient ID: Evan [name], male    DOB: 06/15/1931, 80 y.o.   MRN: [id number]  HPI  OV 01/13/2013  - 2nd opinion with Jaislyn Blinn local ILD person for question of Pirfenidone  reports that he quit smoking about 28 years ago. His smoking use included Cigarettes. He has a 80 pack-year smoking history. He has never used smokeless tobacco.   80 year old who is marginal historian. REfered by Dr Maple Hudson. As best I can gather he has had dx of UIP/Emphysema on CT for  several years. Been on o2 4L [place] for few years. 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 He is on cellcept and with bactrim. His O2 needs have not subjectively gotten worse; objectively not checking. In fact at rest on RA he is 91% in office.  Subjecively progress with dsypnea (no cough) has been very slow even per his own history. Denies cough, cor pulmonale symptoms  HE is really keen on Esbriet/Ofev. Wants to add therapy to prolong life. HE is trying to rationalize that because RA developed years after ILD he has IPF  Note: he is not best historian. Lot of above infor gained from talking to Dr Maple Hudson, record review and emotional content of his body language  TESTS AVILABLE so far  IMPRESSION:  CT chest 07/09/12  Stable findings of moderate to severe COPD with subpleural  reticulation and honeycombing in a pattern typical for the provided  history of pulmonary fibrosis/UIP.  Original Report Authenticated By: Christiana Pellant, M.D.   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 He [facility]. [facility] Dibble [facility]. He had extensive workup all listed below. He had pulmonary function test that basically did not show any change compared to 2012. His autoimmune profile was repeated and showed elevated CCP. Did have a repeat CT scan of the chest that showed persistence of UIP pattern but also new right upper lobe 6 mm lung nodule. All these are detailed below. Was counseled that he was not a candidate for idiopathic pulmonary fibrosis new pharmaceutical therapy. Instead his CellCept was increased to 1000 mg twice daily. He is tolerating this drug well and the increase dose. Is not on any prednisone. Has not had any followup blood work and will have this today. He is reporting no worsening in symptoms. He did have prominent in test on 02/23/2013 is a followup to the increase CellCept and this shows continued stability. All below are reviewed in detail. Of note, he attended pulmonary rehabilitation a few years ago and did not find it of any help. We do not think he is up-to-date with Prevnar and we'll decide on giving this today  [facility] autoimmune 01/20/13 : ccp > 300  6 minute walk test 01/20/2013 [facility]: He maintain oxygen saturation above 88% on 8 L per minute for 2 minutes. Resting heart rate was 78. Maximum heart rate was 126. End of test \\r  saturation was 89%. Perceived dyspnea scale was 3  Pulmonary function test at [facility] 01/20/2013 showed FVC 3.14 L/66%. FEV1 2.28 L/64%. Ratio 73. Total lung capacity 4.7 L/62%. DLCO.  7.5/31%.  CT chest 01/20/13 [facility]  -  Impression:  1. Findings of pulmonary fibrosis in a pattern consistent with UIP.  2. Severe centrilobular emphysema.  3. 6 mm right upper lobe nodule. Recommend comparisons with priors, if  available. If no priors are available, a follow up CT in 6-12 months is  recommended.  4. Nonobstructive right intrarenal calculus.  Electronically Reviewed by: Mariann Laster, MD  Electronically Reviewed on: 01/20/2013 4:10 PM     Pulmonary function test 02/23/2013 at [facility]: FVC 3.14 L or 68%. FEV1 2.39L or 72%. Ratio 72/105%. Total lung capacity 5.3/69%. DLCO 6.9/19%  REC #Pulmonary fibrosis -Breathing test points to continued stability - Have blood test for CBC, chemistry, liver function; will call with results to decide the next blood test -Continue CellCept at 1000 mg twice daily  - Make sure you use sunscreen and wear a hat when you're taking this medication - Continue oxygen as before - CMA will check that if you are eligible for pneumonia vaccine called Prevnar  #Lung nodule 6 mm right upper lobe - Repeat CT scan of the chest without contrast in 6 months  #Followup - 6 months with repeat CT scan of the chest   OV 03/18/2013  Chief Complaint  Patient presents with  . Follow-up    F/U per DR Marchelle Gearing - Repeat labs - Prod cough (yellow- clear) - Increased SOB since last ov   Acute visit. Past 3-4 weeks, since increasing his CellCept dosage, he did have a general feeling of tiredness along with a sensation that he is flu but no actual fevers or subjective fevers or chills. Symptoms are moderate in severity. It is associated with worsening cough associated with white and sometimes yellow sputum. The cough and sputum are new for him. There is associated worsening dyspnea on exertion though not necessarily change in oxygen usage. There is no associated edema, weight loss, chills, hemoptysis  He is here with his wife  Labs from 03/03/2013 on the higher dose CellCept reviewed and shows a rising creatinine which could reflect CellCept toxicity  Labs   Recent Labs Lab 03/18/13 1405  HGB 11.1*  HCT 34.1*  WBC 6.2  PLT 295.0    Recent Labs Lab 03/18/13 1405  NA 137  K 4.6  CL 106  CO2 24  GLUCOSE 94  BUN 27*  CREATININE 1.9*  CALCIUM 8.8  creatinine rising was 1.7mg % on 03/03/13    Recent Labs Lab 03/18/13 1405  AST 20  ALT 16  ALKPHOS 99  BILITOT 0.4  PROT 6.9   ALBUMIN 4.0     OV 04/08/2013  Chief Complaint  Patient presents with  . Follow-up    w/ labs. Pt reports no change in breathing. denies any wheezing, no chest tx. RA sat 80%. Placed on 4 liters O2 91%   FU RA with UIP type Pulmonary fibrosis. Concern for cell cept related renal dysfhnctiion  Stable. No problelms. Off cellcept due to renal issues. Wants to go back on low dose atleast. Labs off cellcept are below: FEels stable. Wants portable o2 system that will give him more than 4L [place]; advanced home care said no   No results found for this basename: AST, ALT, ALKPHOS, BILITOT, PROT, ALBUMIN, INR,  in the last 168 hours   Recent Labs Lab 04/08/13 1205  NA 141  K 4.9  CL 106  CO2 27  GLUCOSE 102*  BUN 25*  CREATININE 1.9*  CALCIUM 9.1   No results found for this basename: HGB, HCT, WBC, PLT,  in the last 168 hours    Review of Systems  Constitutional: Negative for fever and unexpected weight change.  HENT: Negative for congestion, dental problem, ear pain, nosebleeds, postnasal drip, rhinorrhea, sinus pressure, sneezing, sore throat and trouble swallowing.   Eyes: Negative for redness and itching.  Respiratory: Positive for shortness of breath. Negative for cough, chest tightness and wheezing.   Cardiovascular: Negative for palpitations and leg swelling.  Gastrointestinal: Negative for nausea and vomiting.  Genitourinary: Negative for dysuria.  Musculoskeletal: Negative for joint swelling.  Skin: Negative for rash.  Neurological: Negative for headaches.  Hematological: Does not bruise/bleed easily.  Psychiatric/Behavioral: Negative for dysphoric mood. The patient is not nervous/anxious.        Objective:   Physical Exam   ENT:  Head: Normocephalic and atraumatic.  Right Ear: External ear normal.  Left Ear: External ear normal.  Mouth/Throat: Oropharynx is clear and moist. No oropharyngeal exudate.  High nasal cannula 02. Suspect one nostril right side is  blocked due to trauma as a teen playing football  Eyes: Conjunctivae and EOM are normal. Pupils are equal, round, and reactive to light. Right eye exhibits no discharge. Left eye exhibits no discharge. No scleral icterus.  Neck: Normal range of motion. Neck supple. No JVD present. No tracheal deviation present. No thyromegaly present.  Cardiovascular: Normal rate, regular rhythm and intact distal pulses.  Exam reveals no gallop and no friction rub.   No murmur heard. Pulmonary/Chest: Effort normal. No respiratory distress. He has no wheezes. He has rales. He exhibits no tenderness.  Crackles at base . Esp R > L base Abdominal: Soft. Bowel sounds are normal. He exhibits no distension and no mass. There is no tenderness. There is no rebound and no guarding.  Musculoskeletal: Normal range of motion. He exhibits no edema and no tenderness.  Lymphadenopathy:    He has no cervical adenopathy.  Neurological: He is alert and oriented to person, place, and time. He has normal reflexes. No cranial nerve deficit. Coordination normal.  Skin: Skin is warm and dry. No rash noted. He is not diaphoretic. No erythema. No pallor.  Psychiatric: He has a normal mood and affect. His behavior is normal. Judgment and thought content normal.  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

## 2013-04-09 ENCOUNTER — Telehealth: Payer: Self-pay | Admitting: Internal Medicine

## 2013-04-09 DIAGNOSIS — N189 Chronic kidney disease, unspecified: Secondary | ICD-10-CM

## 2013-04-09 DIAGNOSIS — N289 Disorder of kidney and ureter, unspecified: Principal | ICD-10-CM

## 2013-04-09 NOTE — Assessment & Plan Note (Signed)
We stopped cellcept because his creat rose to 1.9mg % from 1.7gm% when I had doubled on his cellcept dose. Now after holiday it seemed to go back down but is back up at 1.9mg %. GFR is in 40s. Will refer to renal and make sure is ok to take cellcept  He wants more o2 with exertion: have asked him to look at [employer] portable system because current helios does not go above 4L

## 2013-04-09 NOTE — Telephone Encounter (Signed)
l et him know creat back up at 1.9mg % and so I dio not know anymore if this is related to cellcept or not. Please refer him to renal. Have him see renal and then come to see me back in 6 - 8 weeks. No cellcept till then  Dr. Kalman Shan, M.D., [facility].C.P Pulmonary and Critical Care Medicine Staff Physician [facility] Pulmonary and Critical Care Pager: [phone], If no answer or between  15:00h - 7:00h: call [phone]  04/09/2013 7:15 AM    Recent Labs Lab 04/08/13 1205  NA 141  K 4.9  CL 106  CO2 27  GLUCOSE 102*  BUN 25*  CREATININE 1.9*  CALCIUM 9.1

## 2013-04-09 NOTE — Telephone Encounter (Signed)
I spoke with the pt and advised. Order placed for referral and reminder placed for f/u in 6-8 weeks. Carron Curie, CMA

## 2013-04-21 ENCOUNTER — Telehealth: Payer: Self-pay | Admitting: Internal Medicine

## 2013-04-21 NOTE — Telephone Encounter (Signed)
I spoke with Evan [name] and she states that they have faxed records to [facility] and they have contacted [place] back and the Evan [name] has been assigned a level 3 and this is about 4 week waiting period. I spoke with the Evan [name] and advised of this and that he will be contacted by [facility]. Carron Curie, CMA

## 2013-06-12 ENCOUNTER — Encounter: Payer: Self-pay | Admitting: Internal Medicine

## 2013-06-12 ENCOUNTER — Other Ambulatory Visit (INDEPENDENT_AMBULATORY_CARE_PROVIDER_SITE_OTHER): Payer: Medicare Other

## 2013-06-12 ENCOUNTER — Ambulatory Visit (INDEPENDENT_AMBULATORY_CARE_PROVIDER_SITE_OTHER): Payer: Medicare Other | Admitting: Internal Medicine

## 2013-06-12 ENCOUNTER — Telehealth: Payer: Self-pay | Admitting: Internal Medicine

## 2013-06-12 VITALS — BP 120/78 | HR 88 | Ht 73.0 in | Wt 186.2 lb

## 2013-06-12 DIAGNOSIS — J8410 Pulmonary fibrosis, unspecified: Secondary | ICD-10-CM

## 2013-06-12 DIAGNOSIS — J9610 Chronic respiratory failure, unspecified whether with hypoxia or hypercapnia: Secondary | ICD-10-CM

## 2013-06-12 DIAGNOSIS — N289 Disorder of kidney and ureter, unspecified: Secondary | ICD-10-CM

## 2013-06-12 DIAGNOSIS — N189 Chronic kidney disease, unspecified: Secondary | ICD-10-CM

## 2013-06-12 DIAGNOSIS — J849 Interstitial pulmonary disease, unspecified: Secondary | ICD-10-CM

## 2013-06-12 DIAGNOSIS — M069 Rheumatoid arthritis, unspecified: Secondary | ICD-10-CM

## 2013-06-12 MED ORDER — LEVOFLOXACIN 500 MG PO TABS: ORAL_TABLET | ORAL | Status: DC

## 2013-06-12 MED ORDER — PREDNISONE 10 MG PO TABS: ORAL_TABLET | ORAL | Status: DC

## 2013-06-12 NOTE — Telephone Encounter (Signed)
CLint  He has a fu to see you in June 2015. Not sure if this was an old one or you want to keep it as [place] seeing him on dual basis. Anything fine; will just let him know   THanks  Dr. Kalman Shan, M.D., [facility].C.P Pulmonary and Critical Care Medicine Staff Physician [facility] Pulmonary and Critical Care Pager: [phone], If no answer or between  15:00h - 7:00h: call [phone]  06/12/2013 5:26 PM

## 2013-06-12 NOTE — Patient Instructions (Addendum)
#  ILD  You might be having bronchitis or ILD flare up Continue to hold off cellcept and bactrim till renal issue sorted out  take levaquin 500mg  once daily  X 5 days Take prednisone 40 mg daily x 2 days, then 20mg  daily x 2 days, then 10mg  daily x 2 days, then 5mg  daily x 2 days and stop  #REnal insuff - recheck bmet blood test  - we will re-refer you to rena; do not know why you do not have appointment yet   #Followup PFT in 2 months to assess disease progression Return to see me in 2 months

## 2013-06-12 NOTE — Progress Notes (Signed)
Subjective:    Patient ID: Evan [name], male    DOB: 1931-05-16, 80 y.o.   MRN: [id number]  HPI   OV 01/13/2013  - 2nd opinion with Evan [name] local ILD person for question of Pirfenidone  reports that he quit smoking about 28 years ago. His smoking use included Cigarettes. He has a 80 pack-year smoking history. He has never used smokeless tobacco.   80 year old who is marginal historian. REfered by Evan Evan [name]. As best I can gather he has had dx of UIP/Emphysema on CT for  several years. Been on o2 4L [place] for few years. Then, susptect in 2010 some years aftger ILD dx, developed RA in hands and sees Evan Evan [name]. He is on cellcept and with bactrim. His O2 needs have not subjectively gotten worse; objectively not checking. In fact at rest on RA he is 91% in office.  Subjecively progress with dsypnea (no cough) has been very slow even per his own history. Denies cough, cor pulmonale symptoms  HE is really keen on Esbriet/Ofev. Wants to add therapy to prolong life. HE is trying to rationalize that because RA developed years after ILD he has IPF  Note: he is not best historian. Lot of above infor gained from talking to Evan Evan [name], record review and emotional content of his body language  TESTS AVILABLE so far  IMPRESSION:  CT chest 07/09/12  Stable findings of moderate to severe COPD with subpleural  reticulation and honeycombing in a pattern typical for the provided  history of pulmonary fibrosis/UIP.  Original Report Authenticated By: Christiana Pellant, M.D.   [facility] 03/26/08  - ANA negative  - RF 793.7  PFT 08/25/10  = FVC 3.18L/70%  - TLC 5.2/75%  - DLCO 6.9/34%  He has not had followup testing on PFT since 2012   Rheum Evan Evan [name] note June 2014   - clearly notes he has RA of joints and has significant symptoms  REC Refer [facility] to get [facility]rd opinion on Esbriet/OFEV  OV 03/03/2013   Followup from [facility] visit. He saw Evan. Hessie [name] at the [facility]. He had extensive workup all listed below. He had pulmonary function test that basically did not show any change compared to 2012. His autoimmune profile was repeated and showed elevated CCP. Did have a repeat CT scan of the chest that showed persistence of UIP pattern but also new right upper lobe 6 mm lung nodule. All these are detailed below. Was counseled that he was not a candidate for idiopathic pulmonary fibrosis new pharmaceutical therapy. Instead his CellCept was increased to 1000 mg twice daily. He is tolerating this drug well and the increase dose. Is not on any prednisone. Has not had any followup blood work and will have this today. He is reporting no worsening in symptoms. He did have prominent in test on 02/23/2013 is a followup to the increase CellCept and this shows continued stability. All below are reviewed in detail. Of note, he attended pulmonary rehabilitation a few years ago and did not find it of any help. We do not think he is up-to-date with Prevnar and we'll decide on giving this today  [facility] autoimmune 01/20/13 : ccp > 300  6 minute walk test 01/20/2013 at [facility]: He maintain oxygen saturation above 88% on 8 L per minute for 2 minutes. Resting heart rate was 78. Maximum heart rate was 126. End of test \\r  saturation was 89%. Perceived dyspnea scale was 3  Pulmonary function test at [facility] 01/20/2013 showed FVC 3.14 L/66%. FEV1 2.28 L/64%. Ratio 73. Total lung capacity 4.7 L/62%. DLCO.  7.5/31%.  CT chest 01/20/13 [facility]  -  Impression:  1. Findings of pulmonary fibrosis in a pattern consistent with UIP.  2. Severe centrilobular emphysema.  3. 6 mm right upper lobe nodule. Recommend comparisons with priors, if  available. If no priors are available, a follow up CT in 6-12 months is  recommended.  4. Nonobstructive right intrarenal calculus.  Electronically Reviewed by: Evan Laster, MD  Electronically Reviewed on: 01/20/2013 4:10 PM     Pulmonary function test 02/23/2013 at [facility]: FVC 3.14 L or 68%. FEV1 2.39L or 72%. Ratio 72/105%. Total lung capacity 5.3/69%. DLCO 6.9/19%  REC #Pulmonary fibrosis -Breathing test points to continued stability - Have blood test for CBC, chemistry, liver function; will call with results to decide the next blood test -Continue CellCept at 1000 mg twice daily  - Make sure you use sunscreen and wear a hat when you're taking this medication - Continue oxygen as before - CMA will check that if you are eligible for pneumonia vaccine called Prevnar  #Lung nodule 6 mm right upper lobe - Repeat CT scan of the chest without contrast in 6 months  #Followup - 6 months with repeat CT scan of the chest   OV 03/18/2013  Chief Complaint  Patient presents with  . Follow-up    F/U per Evan Evan [name] - Repeat labs - Prod cough (yellow- clear) - Increased SOB since last ov   Acute visit. Past 3-4 weeks, since increasing his CellCept dosage, he did have a general feeling of tiredness along with a sensation that he is flu but no actual fevers or subjective fevers or chills. Symptoms are moderate in severity. It is associated with worsening cough associated with white and sometimes yellow sputum. The cough and sputum are new for him. There is associated worsening dyspnea on exertion though not necessarily change in oxygen usage. There is no associated edema, weight loss, chills, hemoptysis  He is here with his wife  Labs from 03/03/2013 on the higher dose CellCept reviewed and shows a rising creatinine which could reflect CellCept toxicity  Labs   Recent Labs Lab 03/18/13 1405  HGB 11.1*  HCT 34.1*  WBC 6.2  PLT 295.0    Recent Labs Lab 03/18/13 1405  NA 137  K 4.6  CL 106  CO2 24  GLUCOSE 94  BUN 27*  CREATININE 1.9*  CALCIUM 8.8  creatinine rising was 1.7mg % on 03/03/13    Recent Labs Lab 03/18/13 1405  AST 20  ALT 16  ALKPHOS 99  BILITOT 0.4  PROT 6.9    ALBUMIN 4.0     OV 04/08/2013  Chief Complaint  Patient presents with  . Follow-up    w/ labs. Pt reports no change in breathing. denies any wheezing, no chest tx. RA sat 80%. Placed on 4 liters O2 91%   FU RA with UIP type Pulmonary fibrosis. Concern for cell cept related renal dysfhnctiion  Stable. No problelms. Off cellcept due to renal issues. Wants to go back on low dose atleast. Labs off cellcept are below: FEels stable. Wants portable o2 system that will give him more than 4L [place]; advanced home care said no  REC   creat back up at 1.9mg % and so I dio not know anymore if this is related to cellcept or not. Please refer him to renal. Have him  see renal and then come to see me back in 6 - 8 weeks. No cellcept till then  OV 06/12/2013   Chief Complaint  Patient presents with  . Follow-up    Pt states breathing has worsened. Pt c/o increase in SOB, productive cough with yellow mucous s/s have been going on since last OV in march. Denies CP.     Followup interstitial lung disease the UIP pattern associated with rheumatoid arthritis  - At last visit his creatinine was rising despite coming off CellCept. I referred him to renal but he tells me that he is yet to receive that appointment. He is kind of frustrated that he is yet to see renal. He continues to be off CellCept but I am not sure if he is off Bactrim. He does not know that either. He continues his oxygen as before. He thinks his interstitial lung disease is slowly progressive based on the fact that he continues to have progressive dyspnea since his last visit. In addition for the last several weeks he's had worsening cough associated with green sputum. There is no associated edema, chest pain, orthopnea or paroxysmal nocturnal dyspnea.  Past, Family, Social reviewed: no change since last visit  No results found for this basename: NA, K, CL, CO2, GLUCOSE, BUN, CREATININE, CALCIUM, MG, PHOS,  in the last 168  hours     Review of Systems  Constitutional: Negative for fever and unexpected weight change.  HENT: Negative for congestion, dental problem, ear pain, nosebleeds, postnasal drip, rhinorrhea, sinus pressure, sneezing, sore throat and trouble swallowing.   Eyes: Negative for redness and itching.  Respiratory: Positive for cough and shortness of breath. Negative for chest tightness and wheezing.   Cardiovascular: Negative for palpitations and leg swelling.  Gastrointestinal: Negative for nausea and vomiting.  Genitourinary: Negative for dysuria.  Musculoskeletal: Negative for joint swelling.  Skin: Negative for rash.  Neurological: Negative for headaches.  Hematological: Does not bruise/bleed easily.  Psychiatric/Behavioral: Negative for dysphoric mood. The patient is not nervous/anxious.        Objective:   Physical Exam  Filed Vitals:   06/12/13 1633  BP: 120/78  Pulse: 88  Height: 6\' 1"  (1.854 m)  Weight: 186 lb 3.2 oz (84.46 kg)  SpO2: 92%      ENT:  Head: Normocephalic and atraumatic.  Right Ear: External ear normal.  Left Ear: External ear normal.  Mouth/Throat: Oropharynx is clear and moist. No oropharyngeal exudate.  High nasal cannula 02.  Eyes: Conjunctivae and EOM are normal. Pupils are equal, round, and reactive to light. Right eye exhibits no discharge. Left eye exhibits no discharge. No scleral icterus.  Neck: Normal range of motion. Neck supple. No JVD present. No tracheal deviation present. No thyromegaly present.  Cardiovascular: Normal rate, regular rhythm and intact distal pulses.  Exam reveals no gallop and no friction rub.   No murmur heard. Pulmonary/Chest: Effort normal. No respiratory distress. He has no wheezes. He has rales. He exhibits no tenderness.  Crackles at base and half way up; likely worse Abdominal: Soft. Bowel sounds are normal. He exhibits no distension and no mass. There is no tenderness. There is no rebound and no guarding.   Musculoskeletal: Normal range of motion. He exhibits no edema and no tenderness.  Lymphadenopathy:    He has no cervical adenopathy.  Neurological: He is alert and oriented to person, place, and time. He has normal reflexes. No cranial nerve deficit. Coordination normal.  Skin: Skin is warm and dry.  No rash noted. He is not diaphoretic. No erythema. No pallor.  Psychiatric: He has a normal mood and affect. His behavior is normal. Judgment and thought content normal.           Assessment & Plan:

## 2013-06-13 NOTE — Telephone Encounter (Signed)
He doesn't need me if he is seeing you. Can you have your nurse help him cancel his June appointment with me. I'll be happy to see him again if needed in the future. Thanks -Clint

## 2013-06-14 DIAGNOSIS — N289 Disorder of kidney and ureter, unspecified: Secondary | ICD-10-CM | POA: Insufficient documentation

## 2013-06-14 NOTE — Telephone Encounter (Signed)
Evan [name]   Please cancel fu with Dr Maple Hudson. Once he is stabilized I will get him back to Dr  Maple Hudson   Dr. Kalman Shan, M.D., [facility].C.P Pulmonary and Critical Care Medicine Staff Physician [facility] [place] [facility] Pager: [phone], If no answer or between  15:00h - 7:00h: call [phone]  06/14/2013 10:26 AM

## 2013-06-14 NOTE — Assessment & Plan Note (Signed)
#  REnal insuff - recheck bmet blood test  - we will re-refer you to rena; do not know why you do not have appointment yet

## 2013-06-14 NOTE — Assessment & Plan Note (Signed)
#  ILD  You might be having bronchitis or ILD flare up Continue to hold off cellcept and bactrim till renal issue sorted out  take levaquin 500mg  once daily  X 5 days Take prednisone 40 mg daily x 2 days, then 20mg  daily x 2 days, then 10mg  daily x 2 days, then 5mg  daily x 2 days and stop    #Followup PFT in 2 months to assess disease progression Return to see me in 2 months

## 2013-06-16 LAB — BASIC METABOLIC PANEL
BUN: 27 mg/dL — ABNORMAL HIGH (ref 6–23)
CALCIUM: 9.4 mg/dL (ref 8.4–10.5)
CHLORIDE: 108 meq/L (ref 96–112)
CO2: 25 mEq/L (ref 19–32)
CREATININE: 1.8 mg/dL — AB (ref 0.4–1.5)
GFR: 39.83 mL/min — ABNORMAL LOW (ref >60.00)
Glucose, Bld: 97 mg/dL (ref 70–99)
Potassium: 4.5 mEq/L (ref 3.5–5.1)
Sodium: 142 mEq/L (ref 135–145)

## 2013-06-16 NOTE — Telephone Encounter (Signed)
lmtcb

## 2013-06-17 NOTE — Progress Notes (Signed)
Quick Note:  lmtcb ______ 

## 2013-06-18 ENCOUNTER — Encounter: Payer: Self-pay | Admitting: Emergency Medicine

## 2013-06-18 NOTE — Telephone Encounter (Signed)
Called and spoke to pt regarding CY appt cancellation. Pt verbalized understanding and confirmed upcoming apt with MR and PFT. Pt denied any further questions or concerns at this time.

## 2013-06-23 ENCOUNTER — Other Ambulatory Visit: Payer: Self-pay | Admitting: Internal Medicine

## 2013-06-23 ENCOUNTER — Telehealth: Payer: Self-pay | Admitting: Internal Medicine

## 2013-06-23 MED ORDER — MYCOPHENOLATE MOFETIL 500 MG PO TABS: 500.0000 mg | ORAL_TABLET | Freq: Two times a day (BID) | ORAL | Status: DC

## 2013-06-23 NOTE — Telephone Encounter (Signed)
Called and spoke to pt regarding recs per MR. Pt verbalized understanding. Pt stated he did not have any more cellcept to take. Cellcept sent to pts preferred pharmacy, pt was informed of correct dose and frequency of medication, pt confirmed with readback. Nothing further needed at this time.

## 2013-06-23 NOTE — Telephone Encounter (Signed)
Elise  Please call .Graciella Belton Ackley and tell him that I spoke in detail with Dr Eliott Nine of renal. They are very backed up and might not see him for a while. She will try to see him as soon as possible. However,  A) she definitely thinks he needs to see them to figure out why kidney is slightly worse   B) but she does not think this is due to cellcept. So, he can restart his cellcept at 500mg  twice daily  C) ensure fu based on prior OV  Thanks  Dr. , M.D., [facility].C.P Pulmonary and Critical Care Medicine Staff Physician [facility] [place] [facility] Pager: [phone], If no answer or between  15:00h - 7:00h: call [phone]  06/23/2013 12:26 PM

## 2013-06-24 NOTE — Progress Notes (Signed)
Quick Note:  Spoke with pt regarding appt and MR recs---see phone note from 06/23/2013 ______

## 2013-07-02 ENCOUNTER — Other Ambulatory Visit: Payer: Self-pay | Admitting: Internal Medicine

## 2013-07-07 ENCOUNTER — Ambulatory Visit: Payer: Medicare Other | Admitting: Internal Medicine

## 2013-07-14 ENCOUNTER — Telehealth: Payer: Self-pay | Admitting: Internal Medicine

## 2013-07-14 NOTE — Telephone Encounter (Signed)
Spoke with the pt  He states still has not heard anything regarding referral to nephrology  I advised we made the referral back in March 2015, and according to referral notes we contacted [facility] on 06/17/13 and were advised that his appt would be 2-3 months out and pt would be informed Pt states no one ever let him know this  He wants an update on this appt asap  Will forward to PCC's  Please advise thanks!

## 2013-07-14 NOTE — Telephone Encounter (Signed)
Called and spoke with patient that I spoke with him in March that these appointments take several months to obtain. Called back to [facility] in May when patient saw our physician again and faxed over the latest ov note along with latest labs. Spoke with Jerald Kief at [facility] who advised that their doctor had rated patient a 3, so that it would still be 2-3 months before an appointment would be made. Called patient back today and explained this again. I advised the patient that I had placed another call into [facility] requesting to know the status of this referral and that I would call him back when I speak with Jerald Kief at [facility]. Rhonda J Cobb

## 2013-07-15 NOTE — Telephone Encounter (Signed)
Spoke with Jerald Kief at [facility] today and she stated that she has the referral and she is finishing up on Feb referrals and should start on March referrals soon. Appointment should be scheduled sometime in July. Called and spoke with patient and advised him of the above. Advised patient that I will call Bjorn Loser at [facility] back around the middle of July to inquire on the appointment status then. Per Jerald Kief, she will contact the patient to arrange appointment. Pt is aware of the above. Nothing else needed at this time. Rhonda J Cobb

## 2013-07-29 ENCOUNTER — Ambulatory Visit: Payer: Medicare Other | Admitting: Cardiology

## 2013-08-03 ENCOUNTER — Ambulatory Visit: Payer: Medicare Other | Admitting: Internal Medicine

## 2013-08-05 ENCOUNTER — Other Ambulatory Visit: Payer: Self-pay | Admitting: Nephrology

## 2013-08-05 ENCOUNTER — Ambulatory Visit (INDEPENDENT_AMBULATORY_CARE_PROVIDER_SITE_OTHER): Payer: Medicare Other | Admitting: Internal Medicine

## 2013-08-05 DIAGNOSIS — J849 Interstitial pulmonary disease, unspecified: Secondary | ICD-10-CM

## 2013-08-05 DIAGNOSIS — N289 Disorder of kidney and ureter, unspecified: Secondary | ICD-10-CM

## 2013-08-05 DIAGNOSIS — M069 Rheumatoid arthritis, unspecified: Secondary | ICD-10-CM

## 2013-08-05 DIAGNOSIS — J8410 Pulmonary fibrosis, unspecified: Secondary | ICD-10-CM

## 2013-08-05 DIAGNOSIS — N183 Chronic kidney disease, stage 3 unspecified: Secondary | ICD-10-CM

## 2013-08-05 DIAGNOSIS — J9610 Chronic respiratory failure, unspecified whether with hypoxia or hypercapnia: Secondary | ICD-10-CM

## 2013-08-05 LAB — PULMONARY FUNCTION TEST
DL/VA % PRED: 37 %
DL/VA: 1.77 ml/min/mmHg/L
DLCO UNC % PRED: 20 %
DLCO UNC: 7.34 ml/min/mmHg
FEF 25-75 POST: 1.81 L/s
FEF 25-75 Pre: 1.32 L/sec
FEF2575-%Change-Post: 37 %
FEF2575-%Pred-Post: 79 %
FEF2575-%Pred-Pre: 57 %
FEV1-%Change-Post: 4 %
FEV1-%Pred-Post: 69 %
FEV1-%Pred-Pre: 66 %
FEV1-PRE: 2.17 L
FEV1-Post: 2.27 L
FEV1FVC-%Change-Post: 2 %
FEV1FVC-%PRED-PRE: 100 %
FEV6-%CHANGE-POST: 2 %
FEV6-%PRED-PRE: 68 %
FEV6-%Pred-Post: 70 %
FEV6-POST: 3 L
FEV6-Pre: 2.93 L
FEV6FVC-%CHANGE-POST: 0 %
FEV6FVC-%PRED-POST: 104 %
FEV6FVC-%Pred-Pre: 103 %
FVC-%Change-Post: 1 %
FVC-%PRED-POST: 67 %
FVC-%Pred-Pre: 65 %
FVC-PRE: 3 L
FVC-Post: 3.06 L
Post FEV1/FVC ratio: 74 %
Post FEV6/FVC ratio: 98 %
Pre FEV1/FVC ratio: 72 %
Pre FEV6/FVC Ratio: 98 %
RV % pred: 78 %
RV: 2.21 L
TLC % pred: 66 %
TLC: 5.08 L

## 2013-08-05 NOTE — Progress Notes (Signed)
PFT done today. 

## 2013-08-07 ENCOUNTER — Ambulatory Visit
Admission: RE | Admit: 2013-08-07 | Discharge: 2013-08-07 | Disposition: A | Discharge status: Home / Self Care | Payer: Medicare Other | Source: Ambulatory Visit | Attending: Nephrology | Admitting: Nephrology

## 2013-08-07 DIAGNOSIS — N183 Chronic kidney disease, stage 3 unspecified: Secondary | ICD-10-CM

## 2013-08-28 ENCOUNTER — Other Ambulatory Visit: Payer: Medicare Other

## 2013-09-04 ENCOUNTER — Ambulatory Visit (INDEPENDENT_AMBULATORY_CARE_PROVIDER_SITE_OTHER)
Admission: RE | Admit: 2013-09-04 | Discharge: 2013-09-04 | Disposition: A | Discharge status: Home / Self Care | Payer: Medicare Other | Source: Ambulatory Visit | Attending: Internal Medicine | Admitting: Internal Medicine

## 2013-09-04 DIAGNOSIS — J8410 Pulmonary fibrosis, unspecified: Secondary | ICD-10-CM

## 2013-09-07 ENCOUNTER — Encounter: Payer: Self-pay | Admitting: Internal Medicine

## 2013-09-07 ENCOUNTER — Ambulatory Visit (INDEPENDENT_AMBULATORY_CARE_PROVIDER_SITE_OTHER): Payer: Medicare Other | Admitting: Internal Medicine

## 2013-09-07 VITALS — BP 122/72 | HR 81 | Ht 73.0 in | Wt 186.0 lb

## 2013-09-07 DIAGNOSIS — J9610 Chronic respiratory failure, unspecified whether with hypoxia or hypercapnia: Secondary | ICD-10-CM

## 2013-09-07 DIAGNOSIS — J9611 Chronic respiratory failure with hypoxia: Secondary | ICD-10-CM

## 2013-09-07 DIAGNOSIS — R0902 Hypoxemia: Secondary | ICD-10-CM

## 2013-09-07 DIAGNOSIS — J209 Acute bronchitis, unspecified: Secondary | ICD-10-CM

## 2013-09-07 DIAGNOSIS — J849 Interstitial pulmonary disease, unspecified: Secondary | ICD-10-CM

## 2013-09-07 DIAGNOSIS — J8410 Pulmonary fibrosis, unspecified: Secondary | ICD-10-CM

## 2013-09-07 MED ORDER — PREDNISONE 10 MG PO TABS: ORAL_TABLET | ORAL | Status: DC

## 2013-09-07 MED ORDER — CEFDINIR 300 MG PO CAPS: 300.0000 mg | ORAL_CAPSULE | Freq: Two times a day (BID) | ORAL | Status: DC

## 2013-09-07 NOTE — Patient Instructions (Addendum)
#  Acute bronchitis  - you are having another episode again  - try cefdinir 300mg  po twice daily x 5 days - Please take prednisone 40 mg x1 day, then 30 mg x1 day, then 20 mg x1 day, then 10 mg x1 day, and then 5 mg x1 day and stop  #ILD disease due to RA  - disease is stable on PFT test - due to acute bronchitis hold off on cellcept restart - refer pulmonary rehab  #FOllowup  3 months   spirometry at followup

## 2013-09-07 NOTE — Progress Notes (Signed)
Subjective:    Patient ID: Evan [name], male    DOB: December 20, 1931, 80 y.o.   MRN: [id number]  HPI  OV 01/13/2013  - 2nd opinion with Lennin Osmond local ILD person for question of Pirfenidone  reports that he quit smoking about 28 years ago. His smoking use included Cigarettes. He has a 80 pack-year smoking history. He has never used smokeless tobacco.   80 year old who is marginal historian. REfered by Dr Maple Hudson. As best I can gather he has had dx of UIP/Emphysema on CT for  several years. Been on o2 4L [place] for few years. 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 He is on cellcept and with bactrim. His O2 needs have not subjectively gotten worse; objectively not checking. In fact at rest on RA he is 91% in office.  Subjecively progress with dsypnea (no cough) has been very slow even per his own history. Denies cough, cor pulmonale symptoms  HE is really keen on Esbriet/Ofev. Wants to add therapy to prolong life. HE is trying to rationalize that because RA developed years after ILD he has IPF  Note: he is not best historian. Lot of above infor gained from talking to Dr Maple Hudson, record review and emotional content of his body language  TESTS AVILABLE so far  IMPRESSION:  CT chest 07/09/12  Stable findings of moderate to severe COPD with subpleural  reticulation and honeycombing in a pattern typical for the provided  history of pulmonary fibrosis/UIP.  Original Report Authenticated By: Christiana Pellant, M.D.   AUtopimmune 03/26/08  - ANA negative  - RF 793.7  PFT 08/25/10  = FVC 3.18L/70%  - TLC 5.2/75%  - DLCO 6.9/34%  He has not had followup testing on PFT since 2012   Rheum Dr Corliss Skains note June 2014   - clearly notes he has RA of joints and has significant symptoms  REC Refer [facility] to get 3rd opinion on Esbriet/OFEV  OV 03/03/2013   Followup from [facility] visit. He saw Dr. Hessie Dibble at the [facility]. He had extensive workup all listed below. He had pulmonary function test that basically did not show any change compared to 2012. His autoimmune profile was repeated and showed elevated CCP. Did have a repeat CT scan of the chest that showed persistence of UIP pattern but also new right upper lobe 6 mm lung nodule. All these are detailed below. Was counseled that he was not a candidate for idiopathic pulmonary fibrosis new pharmaceutical therapy. Instead his CellCept was increased to 1000 mg twice daily. He is tolerating this drug well and the increase dose. Is not on any prednisone. Has not had any followup blood work and will have this today. He is reporting no worsening in symptoms. He did have prominent in test on 02/23/2013 is a followup to the increase CellCept and this shows continued stability. All below are reviewed in detail. Of note, he attended pulmonary rehabilitation a few years ago and did not find it of any help. We do not think he is up-to-date with Prevnar and we'll decide on giving this today  [facility] autoimmune 01/20/13 : ccp > 300  6 minute walk test 01/20/2013 at [facility]: He maintain oxygen saturation above 88% on 8 L per minute for 2 minutes. Resting heart rate was 78. Maximum heart rate was 126. End of test \\r  saturation was 89%. Perceived dyspnea scale was 3  Pulmonary function test at [facility] 01/20/2013 showed FVC 3.14 L/66%. FEV1 2.28 L/64%. Ratio 73. Total lung capacity 4.7 L/62%. DLCO.  7.5/31%.  CT chest 01/20/13 [facility]  -  Impression:  1. Findings of pulmonary fibrosis in a pattern consistent with UIP.  2. Severe centrilobular emphysema.  3. 6 mm right upper lobe nodule. Recommend comparisons with priors, if  available. If no priors are available, a follow up CT in 6-12 months is  recommended.  4. Nonobstructive right intrarenal calculus.  Electronically Reviewed by: Mariann Laster, MD  Electronically Reviewed on: 01/20/2013 4:10 PM     Pulmonary function test 02/23/2013 at [facility]: FVC 3.14 L or 68%. FEV1 2.39L or 72%. Ratio 72/105%. Total lung capacity 5.3/69%. DLCO 6.9/19%  REC #Pulmonary fibrosis -Breathing test points to continued stability - Have blood test for CBC, chemistry, liver function; will call with results to decide the next blood test -Continue CellCept at 1000 mg twice daily  - Make sure you use sunscreen and wear a hat when you're taking this medication - Continue oxygen as before - CMA will check that if you are eligible for pneumonia vaccine called Prevnar  #Lung nodule 6 mm right upper lobe - Repeat CT scan of the chest without contrast in 6 months  #Followup - 6 months with repeat CT scan of the chest   OV 03/18/2013  Chief Complaint  Patient presents with  . Follow-up    F/U per DR Marchelle Gearing - Repeat labs - Prod cough (yellow- clear) - Increased SOB since last ov   Acute visit. Past 3-4 weeks, since increasing his CellCept dosage, he did have a general feeling of tiredness along with a sensation that he is flu but no actual fevers or subjective fevers or chills. Symptoms are moderate in severity. It is associated with worsening cough associated with white and sometimes yellow sputum. The cough and sputum are new for him. There is associated worsening dyspnea on exertion though not necessarily change in oxygen usage. There is no associated edema, weight loss, chills, hemoptysis  He is here with his wife  Labs from 03/03/2013 on the higher dose CellCept reviewed and shows a rising creatinine which could reflect CellCept toxicity  Labs OV 04/08/2013  Chief Complaint  Patient presents with  . Follow-up    w/ labs. Pt reports no change in breathing. denies any wheezing, no chest tx. RA sat 80%. Placed on 4 liters O2 91%   FU RA with UIP type Pulmonary fibrosis. Concern for cell cept related renal dysfhnctiion  Stable. No problelms. Off cellcept due to renal issues. Wants to  go back on low dose atleast. Labs off cellcept are below: FEels stable. Wants portable o2 system that will give him more than 4L [place]; advanced home care said no  REC   creat back up at 1.9mg % and so I dio not know anymore if this is related to cellcept or not. Please refer him to renal. Have him see renal and then come to see me back in 6 - 8 weeks. No cellcept till then  OV 06/12/2013   Chief Complaint  Patient presents with  . Follow-up    Pt states breathing has worsened. Pt c/o increase in SOB, productive cough with yellow mucous s/s have been going on since last OV in march. Denies CP.     Followup interstitial lung disease the UIP pattern associated with rheumatoid arthritis  - At last visit his creatinine was rising despite coming off  CellCept. I referred him to renal but he tells me that he is yet to receive that appointment. He is kind of frustrated that he is yet to see renal. He continues to be off CellCept but I am not sure if he is off Bactrim. He does not know that either. He continues his oxygen as before. He thinks his interstitial lung disease is slowly progressive based on the fact that he continues to have progressive dyspnea since his last visit. In addition for the last several weeks he's had worsening cough associated with green sputum. There is no associated edema, chest pain, orthopnea or paroxysmal nocturnal dyspnea.  Past, Family, Social reviewed: no change since last visit  No results found for this basename: NA, K, CL, CO2, GLUCOSE, BUN, CREATININE, CALCIUM, MG, PHOS,  in the last 168 hours   #ILD  You might be having bronchitis or ILD flare up Continue to hold off cellcept and bactrim till renal issue sorted out  take levaquin 500mg  once daily  X 5 days Take prednisone 40 mg daily x 2 days, then 20mg  daily x 2 days, then 10mg  daily x 2 days, then 5mg  daily x 2 days and stop  #REnal insuff - recheck bmet blood test  - we will re-refer you to rena; do not know  why you do not have appointment yet   #Followup PFT in 2 months to assess disease progression Return to see me in 2 months   OV 09/07/2013  Chief Complaint  Patient presents with  . Follow-up    Pt had PFT on 08/05/2013. Pt states he has progressivly become more SOB since last OV. Pt c/o prod cough with yellow mucous. Pt denies CP/tightness.     FU ILD UIP pattern due to RA with chronic resp failure  - He continues to say he is declining. He is off cellcept due to bronchitis last visit and also til he saw renal. Reviewed dR Eliott Nine notes and she is ok with cellcept restart. His PFTs PFT 08/05/13 shows stabilit : FVC 3.06L/67%, TLC 5.1L/66%, DLCO 7.34/20%. However, past 2 weeks having recurrence of worsening cough, and yellow sputum again. He is also feeling physically deconditioned and not motivated to do anything at home; wife seems to indicate that too. He is reluctantly agreeable to rehab  Review of Systems  Constitutional: Negative for fever and unexpected weight change.  HENT: Positive for congestion. Negative for dental problem, ear pain, nosebleeds, postnasal drip, rhinorrhea, sinus pressure, sneezing, sore throat and trouble swallowing.   Eyes: Negative for redness and itching.  Respiratory: Positive for cough and shortness of breath. Negative for chest tightness and wheezing.   Cardiovascular: Negative for palpitations and leg swelling.  Gastrointestinal: Negative for nausea and vomiting.  Genitourinary: Negative for dysuria.  Musculoskeletal: Negative for joint swelling.  Skin: Negative for rash.  Neurological: Negative for headaches.  Hematological: Does not bruise/bleed easily.  Psychiatric/Behavioral: Negative for dysphoric mood. The patient is not nervous/anxious.    Current outpatient prescriptions:aspirin 81 MG tablet, Take 81 mg by mouth daily.  , Disp: , Rfl: ;  beclomethasone (QVAR) 80 MCG/ACT inhaler, INHALE 2 PUFFS INTO THE LUNGS 2 (TWO) TIMES DAILY., Disp: 8.7 g,  Rfl: 4;  Calcium Carbonate (CALCIUM 600) 1500 MG TABS, Take 1 tablet by mouth daily. , Disp: , Rfl: ;  losartan (COZAAR) 100 MG tablet, Take 1 tablet by mouth daily., Disp: , Rfl:  metoprolol succinate (TOPROL-XL) 50 MG 24 hr tablet, TAKE 1 TABLET BY MOUTH ONCE  A DAY, Disp: 30 tablet, Rfl: 4;  mirtazapine (REMERON) 30 MG tablet, Take 1 tablet by mouth at bedtime., Disp: , Rfl: ;  Multiple Vitamin (MULTIVITAMIN) tablet, Take 1 tablet by mouth daily., Disp: , Rfl: ;  NEXIUM 40 MG capsule, TAKE 1 CAPSULE BY MOUTH ONCE DAILY, Disp: 30 capsule, Rfl: 11 simvastatin (ZOCOR) 40 MG tablet, Take 40 mg by mouth at bedtime.  , Disp: , Rfl: ;  SPIRIVA HANDIHALER 18 MCG inhalation capsule, INHALE CONTENTS OF ONE CAPSULE ONCE DAILY, Disp: 30 capsule, Rfl: 2;  temazepam (RESTORIL) 15 MG capsule, 15 mg. Take one tablet daily at bedtime for sleep, Disp: , Rfl: ;  mycophenolate (CELLCEPT) 500 MG tablet, Take 1 tablet (500 mg total) by mouth 2 (two) times daily., Disp: 60 tablet, Rfl: 3      Objective:   Physical Exam   Filed Vitals:   09/07/13 1418  BP: 122/72  Pulse: 81  Height: 6\' 1"  (1.854 m)  Weight: 186 lb (84.369 kg)  SpO2: 90%    ENT:  Head: Normocephalic and atraumatic.  Right Ear: External ear normal.  Left Ear: External ear normal.  Mouth/Throat: Oropharynx is clear and moist. No oropharyngeal exudate.  High nasal cannula 02.  Eyes: Conjunctivae and EOM are normal. Pupils are equal, round, and reactive to light. Right eye exhibits no discharge. Left eye exhibits no discharge. No scleral icterus.  Neck: Normal range of motion. Neck supple. No JVD present. No tracheal deviation present. No thyromegaly present.  Cardiovascular: Normal rate, regular rhythm and intact distal pulses.  Exam reveals no gallop and no friction rub.   No murmur heard. Pulmonary/Chest: Effort normal. No respiratory distress. He has no wheezes. He has rales. He exhibits no tenderness.  Crackles at base and half way up;  likely worse Abdominal: Soft. Bowel sounds are normal. He exhibits no distension and no mass. There is no tenderness. There is no rebound and no guarding.  Musculoskeletal: Normal range of motion. He exhibits no edema and no tenderness.  Lymphadenopathy:    He has no cervical adenopathy.  Neurological: He is alert and oriented to person, place, and time. He has normal reflexes. No cranial nerve deficit. Coordination normal.  Skin: Skin is warm and dry. No rash noted. He is not diaphoretic. No erythema. No pallor.  Psychiatric: He has a normal mood and affect. His behavior is normal. Judgment and thought content normal.           Assessment & Plan:  #Acute bronchitis  - you are having another episode again  - try cefdinir 300mg  po twice daily x 5 days - Please take prednisone 40 mg x1 day, then 30 mg x1 day, then 20 mg x1 day, then 10 mg x1 day, and then 5 mg x1 day and stop  #ILD disease due to RA  - disease is stable on PFT test - due to acute bronchitis hold off on cellcept restart - refer pulmonary rehab  #FOllowup  3 months   spirometry at followup

## 2013-09-13 DIAGNOSIS — J209 Acute bronchitis, unspecified: Secondary | ICD-10-CM | POA: Insufficient documentation

## 2013-09-13 NOTE — Assessment & Plan Note (Addendum)
 #  ILD disease due to RA  - disease is stable on PFT test - due to acute bronchitis hold off on cellcept restart - refer pulmonary rehab  #FOllowup  3 months   spirometry at followup

## 2013-09-13 NOTE — Assessment & Plan Note (Signed)
#  Acute bronchitis  - you are having another episode again  - try cefdinir 300mg  po twice daily x 5 days - Please take prednisone 40 mg x1 day, then 30 mg x1 day, then 20 mg x1 day, then 10 mg x1 day, and then 5 mg x1 day and stop

## 2013-09-23 ENCOUNTER — Encounter: Payer: Self-pay | Admitting: Pulmonary Disease

## 2013-09-23 ENCOUNTER — Ambulatory Visit (INDEPENDENT_AMBULATORY_CARE_PROVIDER_SITE_OTHER): Payer: Medicare Other | Admitting: Pulmonary Disease

## 2013-09-23 ENCOUNTER — Inpatient Hospital Stay (HOSPITAL_COMMUNITY)
Admission: AD | Admit: 2013-09-23 | Discharge: 2013-10-02 | DRG: 189 | Disposition: A | Discharge status: Other Acute Inpatient Hospital | Payer: Medicare Other | Source: Ambulatory Visit | Attending: Internal Medicine | Admitting: Internal Medicine

## 2013-09-23 ENCOUNTER — Encounter (HOSPITAL_COMMUNITY): Payer: Self-pay | Admitting: *Deleted

## 2013-09-23 ENCOUNTER — Inpatient Hospital Stay (HOSPITAL_COMMUNITY): Payer: Medicare Other

## 2013-09-23 VITALS — BP 92/62 | HR 114

## 2013-09-23 DIAGNOSIS — J8410 Pulmonary fibrosis, unspecified: Secondary | ICD-10-CM | POA: Diagnosis present

## 2013-09-23 DIAGNOSIS — R911 Solitary pulmonary nodule: Secondary | ICD-10-CM

## 2013-09-23 DIAGNOSIS — E785 Hyperlipidemia, unspecified: Secondary | ICD-10-CM

## 2013-09-23 DIAGNOSIS — M069 Rheumatoid arthritis, unspecified: Secondary | ICD-10-CM | POA: Diagnosis present

## 2013-09-23 DIAGNOSIS — K5730 Diverticulosis of large intestine without perforation or abscess without bleeding: Secondary | ICD-10-CM

## 2013-09-23 DIAGNOSIS — J189 Pneumonia, unspecified organism: Secondary | ICD-10-CM | POA: Diagnosis present

## 2013-09-23 DIAGNOSIS — Z7982 Long term (current) use of aspirin: Secondary | ICD-10-CM | POA: Diagnosis not present

## 2013-09-23 DIAGNOSIS — Z87891 Personal history of nicotine dependence: Secondary | ICD-10-CM | POA: Diagnosis not present

## 2013-09-23 DIAGNOSIS — J449 Chronic obstructive pulmonary disease, unspecified: Secondary | ICD-10-CM

## 2013-09-23 DIAGNOSIS — I129 Hypertensive chronic kidney disease with stage 1 through stage 4 chronic kidney disease, or unspecified chronic kidney disease: Secondary | ICD-10-CM | POA: Diagnosis present

## 2013-09-23 DIAGNOSIS — J849 Interstitial pulmonary disease, unspecified: Secondary | ICD-10-CM

## 2013-09-23 DIAGNOSIS — J438 Other emphysema: Secondary | ICD-10-CM | POA: Diagnosis present

## 2013-09-23 DIAGNOSIS — Z9981 Dependence on supplemental oxygen: Secondary | ICD-10-CM

## 2013-09-23 DIAGNOSIS — E43 Unspecified severe protein-calorie malnutrition: Secondary | ICD-10-CM | POA: Insufficient documentation

## 2013-09-23 DIAGNOSIS — R0902 Hypoxemia: Secondary | ICD-10-CM

## 2013-09-23 DIAGNOSIS — J9621 Acute and chronic respiratory failure with hypoxia: Secondary | ICD-10-CM

## 2013-09-23 DIAGNOSIS — Z79899 Other long term (current) drug therapy: Secondary | ICD-10-CM | POA: Diagnosis not present

## 2013-09-23 DIAGNOSIS — F40298 Other specified phobia: Secondary | ICD-10-CM | POA: Diagnosis present

## 2013-09-23 DIAGNOSIS — K219 Gastro-esophageal reflux disease without esophagitis: Secondary | ICD-10-CM | POA: Diagnosis present

## 2013-09-23 DIAGNOSIS — K5900 Constipation, unspecified: Secondary | ICD-10-CM | POA: Diagnosis not present

## 2013-09-23 DIAGNOSIS — I2789 Other specified pulmonary heart diseases: Secondary | ICD-10-CM | POA: Diagnosis present

## 2013-09-23 DIAGNOSIS — N289 Disorder of kidney and ureter, unspecified: Secondary | ICD-10-CM

## 2013-09-23 DIAGNOSIS — J9620 Acute and chronic respiratory failure, unspecified whether with hypoxia or hypercapnia: Secondary | ICD-10-CM | POA: Diagnosis present

## 2013-09-23 DIAGNOSIS — I2510 Atherosclerotic heart disease of native coronary artery without angina pectoris: Secondary | ICD-10-CM | POA: Diagnosis present

## 2013-09-23 DIAGNOSIS — J9611 Chronic respiratory failure with hypoxia: Secondary | ICD-10-CM

## 2013-09-23 DIAGNOSIS — J9600 Acute respiratory failure, unspecified whether with hypoxia or hypercapnia: Secondary | ICD-10-CM

## 2013-09-23 DIAGNOSIS — J84112 Idiopathic pulmonary fibrosis: Secondary | ICD-10-CM | POA: Diagnosis present

## 2013-09-23 DIAGNOSIS — I252 Old myocardial infarction: Secondary | ICD-10-CM

## 2013-09-23 DIAGNOSIS — J9601 Acute respiratory failure with hypoxia: Secondary | ICD-10-CM

## 2013-09-23 DIAGNOSIS — I10 Essential (primary) hypertension: Secondary | ICD-10-CM

## 2013-09-23 DIAGNOSIS — N189 Chronic kidney disease, unspecified: Secondary | ICD-10-CM

## 2013-09-23 DIAGNOSIS — J209 Acute bronchitis, unspecified: Secondary | ICD-10-CM

## 2013-09-23 DIAGNOSIS — I498 Other specified cardiac arrhythmias: Secondary | ICD-10-CM | POA: Diagnosis not present

## 2013-09-23 DIAGNOSIS — K648 Other hemorrhoids: Secondary | ICD-10-CM

## 2013-09-23 DIAGNOSIS — G4700 Insomnia, unspecified: Secondary | ICD-10-CM

## 2013-09-23 LAB — COMPREHENSIVE METABOLIC PANEL
ALK PHOS: 100 U/L (ref 39–117)
ALT: 10 U/L (ref 0–53)
ANION GAP: 14 (ref 5–15)
AST: 25 U/L (ref 0–37)
Albumin: 3.1 g/dL — ABNORMAL LOW (ref 3.5–5.2)
BUN: 24 mg/dL — AB (ref 6–23)
CHLORIDE: 105 meq/L (ref 96–112)
CO2: 21 mEq/L (ref 19–32)
CREATININE: 1.79 mg/dL — AB (ref 0.50–1.35)
Calcium: 8.6 mg/dL (ref 8.4–10.5)
GFR calc Af Amer: 40 mL/min — ABNORMAL LOW (ref >90)
GFR, EST NON AFRICAN AMERICAN: 34 mL/min — AB (ref >90)
Glucose, Bld: 125 mg/dL — ABNORMAL HIGH (ref 70–99)
POTASSIUM: 5 meq/L (ref 3.7–5.3)
Sodium: 140 mEq/L (ref 137–147)
Total Bilirubin: 0.4 mg/dL (ref 0.3–1.2)
Total Protein: 6.6 g/dL (ref 6.0–8.3)

## 2013-09-23 LAB — CBC WITH DIFFERENTIAL/PLATELET
Basophils Absolute: 0 10*3/uL (ref 0.0–0.1)
Basophils Relative: 0 % (ref 0–1)
EOS ABS: 0.3 10*3/uL (ref 0.0–0.7)
EOS PCT: 3 % (ref 0–5)
HEMATOCRIT: 36.1 % — AB (ref 39.0–52.0)
Hemoglobin: 11.7 g/dL — ABNORMAL LOW (ref 13.0–17.0)
LYMPHS ABS: 1.4 10*3/uL (ref 0.7–4.0)
Lymphocytes Relative: 13 % (ref 12–46)
MCH: 30.2 pg (ref 26.0–34.0)
MCHC: 32.4 g/dL (ref 30.0–36.0)
MCV: 93 fL (ref 78.0–100.0)
MONO ABS: 0.9 10*3/uL (ref 0.1–1.0)
MONOS PCT: 8 % (ref 3–12)
Neutro Abs: 7.9 10*3/uL — ABNORMAL HIGH (ref 1.7–7.7)
Neutrophils Relative %: 76 % (ref 43–77)
PLATELETS: 234 10*3/uL (ref 150–400)
RBC: 3.88 MIL/uL — ABNORMAL LOW (ref 4.22–5.81)
RDW: 13.4 % (ref 11.5–15.5)
WBC: 10.4 10*3/uL (ref 4.0–10.5)

## 2013-09-23 LAB — GLUCOSE, CAPILLARY
GLUCOSE-CAPILLARY: 134 mg/dL — AB (ref 70–99)
GLUCOSE-CAPILLARY: 179 mg/dL — AB (ref 70–99)

## 2013-09-23 LAB — PROTIME-INR
INR: 1.28 (ref 0.00–1.49)
PROTHROMBIN TIME: 16 s — AB (ref 11.6–15.2)

## 2013-09-23 LAB — PRO B NATRIURETIC PEPTIDE: Pro B Natriuretic peptide (BNP): 290.5 pg/mL (ref 0–450)

## 2013-09-23 LAB — MAGNESIUM: MAGNESIUM: 1.9 mg/dL (ref 1.5–2.5)

## 2013-09-23 LAB — PHOSPHORUS: Phosphorus: 2.6 mg/dL (ref 2.3–4.6)

## 2013-09-23 LAB — TROPONIN I

## 2013-09-23 LAB — PROCALCITONIN

## 2013-09-23 LAB — MRSA PCR SCREENING: MRSA BY PCR: NEGATIVE

## 2013-09-23 LAB — SEDIMENTATION RATE: Sed Rate: 75 mm/hr — ABNORMAL HIGH (ref 0–16)

## 2013-09-23 MED ORDER — CALCIUM CARBONATE 1250 (500 CA) MG PO TABS
1.0000 | ORAL_TABLET | Freq: Every day | ORAL | Status: DC
  Administered 2013-09-24 – 2013-10-02 (×9): 500 mg via ORAL
  Filled 2013-09-23 (×10): qty 1

## 2013-09-23 MED ORDER — PANTOPRAZOLE SODIUM 40 MG PO TBEC
40.0000 mg | DELAYED_RELEASE_TABLET | Freq: Every day | ORAL | Status: DC
  Administered 2013-09-24 – 2013-10-02 (×9): 40 mg via ORAL
  Filled 2013-09-23 (×9): qty 1

## 2013-09-23 MED ORDER — SODIUM CHLORIDE 0.9 % IV SOLN
INTRAVENOUS | Status: DC
  Administered 2013-09-23 – 2013-09-27 (×5): via INTRAVENOUS
  Administered 2013-09-28: 1000 mL via INTRAVENOUS

## 2013-09-23 MED ORDER — METHYLPREDNISOLONE SODIUM SUCC 125 MG IJ SOLR
80.0000 mg | Freq: Four times a day (QID) | INTRAMUSCULAR | Status: DC
  Administered 2013-09-23 – 2013-09-26 (×12): 80 mg via INTRAVENOUS
  Filled 2013-09-23 (×15): qty 1.28

## 2013-09-23 MED ORDER — INSULIN ASPART 100 UNIT/ML ~~LOC~~ SOLN
0.0000 [IU] | Freq: Three times a day (TID) | SUBCUTANEOUS | Status: DC
  Administered 2013-09-23: 2 [IU] via SUBCUTANEOUS
  Administered 2013-09-24: 5 [IU] via SUBCUTANEOUS
  Administered 2013-09-24: 2 [IU] via SUBCUTANEOUS
  Administered 2013-09-24: 3 [IU] via SUBCUTANEOUS
  Administered 2013-09-25 (×2): 5 [IU] via SUBCUTANEOUS
  Administered 2013-09-25: 3 [IU] via SUBCUTANEOUS
  Administered 2013-09-26: 5 [IU] via SUBCUTANEOUS
  Administered 2013-09-26: 3 [IU] via SUBCUTANEOUS
  Administered 2013-09-26: 8 [IU] via SUBCUTANEOUS
  Administered 2013-09-27 – 2013-09-29 (×7): 3 [IU] via SUBCUTANEOUS
  Administered 2013-09-29: 8 [IU] via SUBCUTANEOUS
  Administered 2013-09-29 – 2013-09-30 (×2): 5 [IU] via SUBCUTANEOUS
  Administered 2013-09-30 (×2): 3 [IU] via SUBCUTANEOUS
  Administered 2013-10-01 (×3): 5 [IU] via SUBCUTANEOUS
  Administered 2013-10-02 (×2): 3 [IU] via SUBCUTANEOUS

## 2013-09-23 MED ORDER — METOPROLOL SUCCINATE ER 50 MG PO TB24
50.0000 mg | ORAL_TABLET | Freq: Every day | ORAL | Status: DC
  Administered 2013-09-23 – 2013-09-25 (×3): 50 mg via ORAL
  Filled 2013-09-23 (×6): qty 1

## 2013-09-23 MED ORDER — ASPIRIN EC 81 MG PO TBEC
81.0000 mg | DELAYED_RELEASE_TABLET | Freq: Every day | ORAL | Status: DC
  Administered 2013-09-24 – 2013-10-02 (×9): 81 mg via ORAL
  Filled 2013-09-23 (×10): qty 1

## 2013-09-23 MED ORDER — LOSARTAN POTASSIUM 50 MG PO TABS
100.0000 mg | ORAL_TABLET | Freq: Every day | ORAL | Status: DC
  Administered 2013-09-24 – 2013-10-02 (×9): 100 mg via ORAL
  Filled 2013-09-23 (×10): qty 2

## 2013-09-23 MED ORDER — PIPERACILLIN-TAZOBACTAM 3.375 G IVPB
3.3750 g | Freq: Three times a day (TID) | INTRAVENOUS | Status: DC
  Administered 2013-09-23 – 2013-10-02 (×27): 3.375 g via INTRAVENOUS
  Filled 2013-09-23 (×31): qty 50

## 2013-09-23 MED ORDER — BUDESONIDE 0.25 MG/2ML IN SUSP
0.2500 mg | Freq: Two times a day (BID) | RESPIRATORY_TRACT | Status: DC
  Administered 2013-09-23 – 2013-10-02 (×18): 0.25 mg via RESPIRATORY_TRACT
  Filled 2013-09-23 (×22): qty 2

## 2013-09-23 MED ORDER — ALBUTEROL SULFATE (2.5 MG/3ML) 0.083% IN NEBU
2.5000 mg | INHALATION_SOLUTION | RESPIRATORY_TRACT | Status: DC | PRN
  Administered 2013-09-27: 2.5 mg via RESPIRATORY_TRACT
  Filled 2013-09-23: qty 3

## 2013-09-23 MED ORDER — MIRTAZAPINE 30 MG PO TABS
30.0000 mg | ORAL_TABLET | Freq: Every day | ORAL | Status: DC
  Administered 2013-09-23 – 2013-10-01 (×9): 30 mg via ORAL
  Filled 2013-09-23 (×10): qty 1

## 2013-09-23 MED ORDER — TEMAZEPAM 15 MG PO CAPS
15.0000 mg | ORAL_CAPSULE | Freq: Every evening | ORAL | Status: DC | PRN
  Administered 2013-09-25 – 2013-10-01 (×6): 15 mg via ORAL
  Filled 2013-09-23 (×6): qty 1

## 2013-09-23 MED ORDER — LEVOFLOXACIN IN D5W 750 MG/150ML IV SOLN
750.0000 mg | INTRAVENOUS | Status: DC
  Administered 2013-09-23: 750 mg via INTRAVENOUS
  Filled 2013-09-23 (×2): qty 150

## 2013-09-23 MED ORDER — ARFORMOTEROL TARTRATE 15 MCG/2ML IN NEBU
15.0000 ug | INHALATION_SOLUTION | Freq: Two times a day (BID) | RESPIRATORY_TRACT | Status: DC
  Administered 2013-09-23 – 2013-10-02 (×17): 15 ug via RESPIRATORY_TRACT
  Filled 2013-09-23 (×23): qty 2

## 2013-09-23 MED ORDER — CETYLPYRIDINIUM CHLORIDE 0.05 % MT LIQD
7.0000 mL | Freq: Two times a day (BID) | OROMUCOSAL | Status: DC
  Administered 2013-09-23 – 2013-10-02 (×15): 7 mL via OROMUCOSAL

## 2013-09-23 MED ORDER — SIMVASTATIN 40 MG PO TABS
40.0000 mg | ORAL_TABLET | Freq: Every day | ORAL | Status: DC
  Administered 2013-09-23 – 2013-10-01 (×9): 40 mg via ORAL
  Filled 2013-09-23 (×12): qty 1

## 2013-09-23 MED ORDER — TIOTROPIUM BROMIDE MONOHYDRATE 18 MCG IN CAPS
18.0000 ug | ORAL_CAPSULE | Freq: Every day | RESPIRATORY_TRACT | Status: DC
  Administered 2013-09-24 – 2013-10-02 (×9): 18 ug via RESPIRATORY_TRACT
  Filled 2013-09-23 (×3): qty 5

## 2013-09-23 MED ORDER — HEPARIN SODIUM (PORCINE) 5000 UNIT/ML IJ SOLN
5000.0000 [IU] | Freq: Three times a day (TID) | INTRAMUSCULAR | Status: DC
  Administered 2013-09-23 – 2013-10-02 (×28): 5000 [IU] via SUBCUTANEOUS
  Filled 2013-09-23 (×34): qty 1

## 2013-09-23 NOTE — H&P (Signed)
Name: Evan [name] MRN: [id number] DOB: 10-16-1931    ADMISSION DATE:  09/23/2013   PRIMARY SERVICE:  PCCM   CHIEF COMPLAINT:  Acute on chronic respiratory failure   BRIEF PATIENT DESCRIPTION:  80 year old male f/b MR w/ known h/o chronic resp failure (O2 at 4 liters/pulsed) on basis of  ILD felt to be RA related (off cellcept since 03/2013 d/t rising Scr) . Admitted from office on 9/2 w/ working dx of  acute on chronic resp failure in setting of CAP vs IPF flare.   SIGNIFICANT EVENTS / STUDIES:  CT chest 9/2>>>  CULTURES/ID workup: Sputum 9/2>>> u strep antigen 9/2>>> u legionella antigen 9/2>>> resp viral panel 9/2>>> admitc PCT 9/2>>>  ANTIBIOTICS: Zosyn 9/2>>> levaquin 9/2>>>  HISTORY OF PRESENT ILLNESS:   80 year old male f/b MR w/ known h/o chronic resp failure (O2 at 4 liters/pulsed) on basis of  ILD felt to be RA related (off cellcept since 03/2013 d/t rising Scr). Presented to the office on 9/2 for sick work-in. On presentation reported ~ 5-7 day h/o cough productive of clear to yellow colored sputum, w/ associated worsening dyspnea. At baseline he can ambulate the distance of [place] and [place] on presentation he is SOB at ~ 74ft.  He  Denies fever, denies chest pain, but has felt like his heart has been racing,  denies wheeze, denies sick exposures, denies swelling, denies light headedness, N or V.  On 4 liters pulsed O2 his saturations in the office were 71%, these raised to 80s on 4L cont O2. He was sent to [facility] stepdown unit as direct admit w/ working dx of acute on chronic resp failure on basis of CAP vs ILD flare.   PAST MEDICAL HISTORY :  Past Medical History  Diagnosis Date  . Coronary atherosclerosis of unspecified type of vessel, native or graft   . Rheumatoid arthritis(714.0)   . Other emphysema   . Postinflammatory pulmonary fibrosis   . Acute bronchitis   . Chronic airway obstruction, not elsewhere classified    Past Surgical History  Procedure  Laterality Date  . Stents with ami  2004  . Foot surgery      left  . Vericose vein surgery    . Aortic aneuyrsm thoracic      endovascular [place]ham   Prior to Admission medications   Medication Sig Start Date End Date Taking? Authorizing Provider  aspirin 81 MG tablet Take 81 mg by mouth daily.      Historical Provider, MD  beclomethasone (QVAR) 80 MCG/ACT inhaler INHALE 2 PUFFS INTO THE LUNGS 2 (TWO) TIMES DAILY. 03/20/13   Waymon Budge, MD  Calcium Carbonate (CALCIUM 600) 1500 MG TABS Take 1 tablet by mouth daily.     Historical Provider, MD  losartan (COZAAR) 100 MG tablet Take 1 tablet by mouth daily.    Historical Provider, MD  metoprolol succinate (TOPROL-XL) 50 MG 24 hr tablet TAKE 1 TABLET BY MOUTH ONCE A DAY 02/14/13   Donato Schultz, MD  mirtazapine (REMERON) 30 MG tablet Take 1 tablet by mouth at bedtime.    Historical Provider, MD  Multiple Vitamin (MULTIVITAMIN) tablet Take 1 tablet by mouth daily.    Historical Provider, MD  NEXIUM 40 MG capsule TAKE 1 CAPSULE BY MOUTH ONCE DAILY 12/03/12   Waymon Budge, MD  simvastatin (ZOCOR) 40 MG tablet Take 40 mg by mouth at bedtime.      Historical Provider, MD  SPIRIVA HANDIHALER 18 MCG inhalation  capsule INHALE CONTENTS OF ONE CAPSULE ONCE DAILY 07/02/13   Kalman Shan, MD  temazepam (RESTORIL) 15 MG capsule 15 mg. Take one tablet daily at bedtime for sleep 10/26/12   Historical Provider, MD   No Known Allergies  FAMILY HISTORY:  Family History  Problem Relation Age of Onset  . Breast cancer Sister   . Colon cancer Father   . Pulmonary fibrosis Brother    SOCIAL HISTORY:  reports that he quit smoking about 28 years ago. His smoking use included Cigarettes. He has a 80 pack-year smoking history. He has never used smokeless tobacco. He reports that he drinks alcohol. He reports that he does not use illicit drugs.  Review of Systems:   Bolds are positive  Constitutional: weight loss, gain, night sweats, Fevers, chills,  fatigue .  HEENT: headaches, Sore throat, sneezing, nasal congestion, post nasal drip, Difficulty swallowing, Tooth/dental problems, visual complaints visual changes, ear ache CV:  chest pain, radiates: ,Orthopnea, PND, swelling in lower extremities, dizziness, palpitations, heart racing syncope.  GI  heartburn, indigestion, abdominal pain, nausea, vomiting, diarrhea, change in bowel habits, loss of appetite, bloody stools.  Resp: see HPI  Skin: rash or itching or icterus GU: dysuria, change in color of urine, urgency or frequency. flank pain, hematuria  MS: joint pain or swelling. decreased range of motion  Psych: change in mood or affect. depression or anxiety.  Neuro: difficulty with speech, weakness, numbness, ataxia    SUBJECTIVE:  No distress but very SOB  VITAL SIGNS: Pulse Rate:  [114] 114 (09/02 1335) BP: (92)/(62) 92/62 mmHg (09/02 1335) SpO2:  [71 %-88 %] 88 % (09/02 1340) FiO2 (%):  [26 %] 26 % (09/02 1335)  PHYSICAL EXAMINATION: General:  Anxious 80 year old male. Able to complete full sentences in spite of pulse ox in mid-80s Neuro:  Non-focal and intact  HEENT:  [place], no JVD, MMM  Cardiovascular:  Tachy rrr  Lungs:  Dry and diffuse velcro like rales  Abdomen: soft, non-tender  Musculoskeletal:  Intact  Skin:  Pale, 2 + pulses. LE appear a little mottled after ambulating   No results found for this basename: NA, K, CL, CO2, BUN, CREATININE, GLUCOSE,  in the last 168 hours No results found for this basename: HGB, HCT, WBC, PLT,  in the last 168 hours No results found.  ASSESSMENT / PLAN:  Acute on Chronic respiratory failure in the setting of CAP vs ILD flare H/o GOLD D COPD (predominately by CAT scoring as FEV1 66%)  ILD felt to be related to RA. Has not been on cellcept since March 2015 d/t rising scr and recurrent office sick visits. Suspect that this is most likely infection but ILD flare on ddx as well.   Plan Admit the the SDU Supplemental oxygen  High  resolution CT chest to better define infiltrates Send Sputum culture, Procalcitonin protocol, CBC, Sed rate, BNP, resp viral antigen panel, urine antigens  Empiric Levaquin and zosyn (psuedomonas risk and recent abx ~2 weeks ago) Scheduled BDs (will do brovana/budesonide and spiriva) Systemic steroids 80mg  IV q6 Flutter and IS for pulm hygiene   CRI (BL scr 1.7 to 1.9)  Plan Gentle hydration Ck chemistry Pharmacy to dose meds  H/o CAD and HTN  Plan Continue home meds   H/o GERD Plan PPI    ACNP-BC [facility] Pulmonary/Critical Care Pager # [phone] OR # [phone] if no answer    09/23/2013, 2:04 PM  I have personally interviewed the pt and examined  in detail.  Agree with assessment as above.  The most likely diagnosis is CAP, but would also consider an acute flare of his ILD in the differential.  Will treat empirically with broad spectrum abx, especially since he has been on immunosuppression in the past.  Will also treat with steroids, and check HRCT to evaluate for acute exacerbation of his ILD.

## 2013-09-23 NOTE — Progress Notes (Signed)
Pt arrived to 3S02 via wheelchair. Pt arrived with wife and family friend. Pt wearing O2 via nasal cannula, home O2 device. Pt in no apparent discomfort or distress. Pt able to stand and ambulate independently to the restroom to change into hospital gown. Anders Simmonds, NP at the bedside to speak with family and assess patient. Will obtain vitals, complete assessment, and await orders for care. Will continue to monitor. Asher Muir Kijana Cromie,RN

## 2013-09-23 NOTE — Progress Notes (Signed)
ANTIBIOTIC CONSULT NOTE - INITIAL  Pharmacy Consult for Zosyn Indication: rule out pneumonia  No Known Allergies  Patient Measurements: Height: 6\' 1"  (185.4 cm) Weight: 175 lb 8 oz (79.606 kg) IBW/kg (Calculated) : 79.9  Vital Signs: Temp: 98.7 F (37.1 C) (09/02 1535) BP: 129/73 mmHg (09/02 1548) Pulse Rate: 122 (09/02 1548) Intake/Output from previous day:   Intake/Output from this shift:    Labs: No results found for this basename: WBC, HGB, PLT, LABCREA, CREATININE,  in the last 72 hours Estimated Creatinine Clearance: 37.5 ml/min (by C-G formula based on Cr of 1.8). No results found for this basename: VANCOTROUGH, VANCOPEAK, VANCORANDOM, GENTTROUGH, GENTPEAK, GENTRANDOM, TOBRATROUGH, TOBRAPEAK, TOBRARND, AMIKACINPEAK, AMIKACINTROU, AMIKACIN,  in the last 72 hours   Microbiology: No results found for this or any previous visit (from the past 720 hour(s)).  Medical History: Past Medical History  Diagnosis Date  . Coronary atherosclerosis of unspecified type of vessel, native or graft   . Rheumatoid arthritis(714.0)   . Other emphysema   . Postinflammatory pulmonary fibrosis   . Acute bronchitis   . Chronic airway obstruction, not elsewhere classified     Medications:  Prescriptions prior to admission  Medication Sig Dispense Refill  . aspirin 81 MG tablet Take 81 mg by mouth daily.        . beclomethasone (QVAR) 80 MCG/ACT inhaler INHALE 2 PUFFS INTO THE LUNGS 2 (TWO) TIMES DAILY.  8.7 g  4  . Calcium Carbonate (CALCIUM 600) 1500 MG TABS Take 1 tablet by mouth daily.       01-07-1987 losartan (COZAAR) 100 MG tablet Take 1 tablet by mouth daily.      . metoprolol succinate (TOPROL-XL) 50 MG 24 hr tablet TAKE 1 TABLET BY MOUTH ONCE A DAY  30 tablet  4  . mirtazapine (REMERON) 30 MG tablet Take 1 tablet by mouth at bedtime.      . Multiple Vitamin (MULTIVITAMIN) tablet Take 1 tablet by mouth daily.      [name] Kitchen NEXIUM 40 MG capsule TAKE 1 CAPSULE BY MOUTH ONCE DAILY  30 capsule   11  . simvastatin (ZOCOR) 40 MG tablet Take 40 mg by mouth at bedtime.        [name] Kitchen SPIRIVA HANDIHALER 18 MCG inhalation capsule INHALE CONTENTS OF ONE CAPSULE ONCE DAILY  30 capsule  2  . temazepam (RESTORIL) 15 MG capsule 15 mg. Take one tablet daily at bedtime for sleep       Assessment: 80 yo M with interstitial lung disease admitted 09/23/2013 with increasing SOB.  Pharmacy consulted to dose zosyn for possible pnemonia  ID: r/o PNA 9/2>> zosyn  Renal:  Baseline SCr 5/15 ~1.8, est CrCl ~30-35 ml/min  Goal of Therapy:  Renal adjustment of antibiotics  Plan:  1. Zosyn 3.375g IV q8h infuse over 4h 2. Follow up SCr, UOP, cultures, clinical course and adjust as clinically indicated.  Thank you for allowing pharmacy to be a part of this patients care team.  6/15 Pharm.D., BCPS, AQ-Cardiology Clinical Pharmacist 09/23/2013 5:11 PM Pager: [phone] Phone: [phone]

## 2013-09-23 NOTE — Patient Instructions (Signed)
Will admit you to the hospital for antibiotics and further evaluation.

## 2013-09-23 NOTE — Progress Notes (Signed)
   Subjective:    Patient ID: Evan [name], male    DOB: 1931-12-24, 81 y.o.   MRN: [id number]  HPI See admit h and p.    Review of Systems  Constitutional: Negative for fever and unexpected weight change.  HENT: Negative for congestion, dental problem, ear pain, nosebleeds, postnasal drip, rhinorrhea, sinus pressure, sneezing, sore throat and trouble swallowing.   Eyes: Negative for redness and itching.  Respiratory: Positive for cough and shortness of breath. Negative for chest tightness and wheezing.   Cardiovascular: Negative for palpitations and leg swelling.  Gastrointestinal: Negative for nausea and vomiting.  Genitourinary: Negative for dysuria.  Musculoskeletal: Negative for joint swelling.  Skin: Negative for rash.  Neurological: Negative for headaches.  Hematological: Does not bruise/bleed easily.  Psychiatric/Behavioral: Negative for dysphoric mood. The patient is not nervous/anxious.        Objective:   Physical Exam        Assessment & Plan:

## 2013-09-24 ENCOUNTER — Inpatient Hospital Stay (HOSPITAL_COMMUNITY): Payer: Medicare Other

## 2013-09-24 DIAGNOSIS — J209 Acute bronchitis, unspecified: Secondary | ICD-10-CM

## 2013-09-24 LAB — TROPONIN I: Troponin I: <0.3 ng/mL (ref <0.30)

## 2013-09-24 LAB — GLUCOSE, CAPILLARY
GLUCOSE-CAPILLARY: 168 mg/dL — AB (ref 70–99)
GLUCOSE-CAPILLARY: 236 mg/dL — AB (ref 70–99)
Glucose-Capillary: 142 mg/dL — ABNORMAL HIGH (ref 70–99)
Glucose-Capillary: 250 mg/dL — ABNORMAL HIGH (ref 70–99)

## 2013-09-24 LAB — CBC
HCT: 35.8 % — ABNORMAL LOW (ref 39.0–52.0)
Hemoglobin: 11.4 g/dL — ABNORMAL LOW (ref 13.0–17.0)
MCH: 29.6 pg (ref 26.0–34.0)
MCHC: 31.8 g/dL (ref 30.0–36.0)
MCV: 93 fL (ref 78.0–100.0)
PLATELETS: 225 10*3/uL (ref 150–400)
RBC: 3.85 MIL/uL — ABNORMAL LOW (ref 4.22–5.81)
RDW: 13.3 % (ref 11.5–15.5)
WBC: 4.7 10*3/uL (ref 4.0–10.5)

## 2013-09-24 LAB — BASIC METABOLIC PANEL
Anion gap: 12 (ref 5–15)
BUN: 26 mg/dL — ABNORMAL HIGH (ref 6–23)
CHLORIDE: 105 meq/L (ref 96–112)
CO2: 22 mEq/L (ref 19–32)
Calcium: 8.6 mg/dL (ref 8.4–10.5)
Creatinine, Ser: 1.7 mg/dL — ABNORMAL HIGH (ref 0.50–1.35)
GFR, EST AFRICAN AMERICAN: 42 mL/min — AB (ref >90)
GFR, EST NON AFRICAN AMERICAN: 37 mL/min — AB (ref >90)
Glucose, Bld: 173 mg/dL — ABNORMAL HIGH (ref 70–99)
Potassium: 5.2 mEq/L (ref 3.7–5.3)
SODIUM: 139 meq/L (ref 137–147)

## 2013-09-24 LAB — PROCALCITONIN: Procalcitonin: <0.1 ng/mL

## 2013-09-24 LAB — STREP PNEUMONIAE URINARY ANTIGEN: Strep Pneumo Urinary Antigen: NEGATIVE

## 2013-09-24 MED ORDER — ENSURE COMPLETE PO LIQD
237.0000 mL | Freq: Three times a day (TID) | ORAL | Status: DC
  Administered 2013-09-24 – 2013-10-01 (×18): 237 mL via ORAL

## 2013-09-24 MED ORDER — ENSURE PUDDING PO PUDG: 1.0000 | Freq: Two times a day (BID) | ORAL | Status: DC | PRN

## 2013-09-24 MED ORDER — LEVOFLOXACIN IN D5W 750 MG/150ML IV SOLN
750.0000 mg | INTRAVENOUS | Status: DC
  Filled 2013-09-24: qty 150

## 2013-09-24 NOTE — Progress Notes (Signed)
Pt unable to void. Alternate interventions include attempted use of urinal, stand at bedside and assisted ambulation to the bathroom. Bladder scan volume 693 ml. In and out cath completed as ordered emptied 800 ml. Pt tolerated well.

## 2013-09-24 NOTE — Progress Notes (Signed)
Name: Evan [name] MRN: [id number] DOB: 1931/11/10    ADMISSION DATE:  09/23/2013   PRIMARY SERVICE:  PCCM   CHIEF COMPLAINT:  Acute on chronic respiratory failure   BRIEF PATIENT DESCRIPTION:    80 year old male f/b MR w/ known h/o chronic resp failure (O2 at 4 liters/pulsed) on basis of  ILD felt to be RA related (off cellcept since 03/2013 d/t rising Scr). Presented to the office on 9/2 for sick work-in. On presentation reported ~ 5-7 day h/o cough productive of clear to yellow colored sputum, w/ associated worsening dyspnea. At baseline he can ambulate the distance of [place] and [place] on presentation he is SOB at ~ 80ft.  He  Denies fever, denies chest pain, but has felt like his heart has been racing,  denies wheeze, denies sick exposures, denies swelling, denies light headedness, N or V.  On 4 liters pulsed O2 his saturations in the office were 71%, these raised to 80s on 4L cont O2. He was sent to [facility] stepdown unit as direct admit w/ working dx of acute on chronic resp failure on basis of CAP vs ILD flare.    CULTURES/ID workup: Sputum 9/2>>> u strep antigen 9/2>>> u legionella antigen 9/2>>> resp viral panel 9/2>>> admitc PCT 9/2>>>  ANTIBIOTICS: Zosyn 9/2>>> levaquin 9/2>>>    SUBJECTIVE:   09/24/13: Feels significantly better. Says he thought he was dying yesterday. Now on 6L [place] at rest.  PCT and trop aer negative   VITAL SIGNS: Temp:  [97.5 F (36.4 C)-98.7 F (37.1 C)] 97.5 F (36.4 C) (09/03 0730) Pulse Rate:  [60-122] 60 (09/03 0331) Resp:  [15-21] 15 (09/03 0331) BP: (92-129)/(62-73) 119/70 mmHg (09/03 0331) SpO2:  [71 %-98 %] 98 % (09/03 0331) FiO2 (%):  [26 %] 26 % (09/02 1335) Weight:  [79.606 kg (175 lb 8 oz)] 79.606 kg (175 lb 8 oz) (09/02 1600)  PHYSICAL EXAMINATION: General:  Looks better Neuro:  Non-focal and intact  HEENT:  [place], no JVD, MMM  Cardiovascular:  Tachy rrr  Lungs:  Dry and diffuse velcro like rales  Abdomen: soft, non-tender    Musculoskeletal:  Intact  Skin:  Pale, 2 + pulses. LE appear a little mottled after ambulating   PULMONARY No results found for this basename: PHART, PCO2, PCO2ART, PO2, PO2ART, HCO3, TCO2, O2SAT,  in the last 168 hours  CBC  Recent Labs Lab 09/23/13 1742 09/24/13 0249  HGB 11.7* 11.4*  HCT 36.1* 35.8*  WBC 10.4 4.7  PLT 234 225    COAGULATION  Recent Labs Lab 09/23/13 1742  INR 1.28    CARDIAC   Recent Labs Lab 09/23/13 1742 09/23/13 2256 09/24/13 0249  TROPONINI <0.30 <0.30 <0.30    Recent Labs Lab 09/23/13 1742  PROBNP 290.5     CHEMISTRY  Recent Labs Lab 09/23/13 1742 09/24/13 0249  NA 140 139  K 5.0 5.2  CL 105 105  CO2 21 22  GLUCOSE 125* 173*  BUN 24* 26*  CREATININE 1.79* 1.70*  CALCIUM 8.6 8.6  MG 1.9  --   PHOS 2.6  --    Estimated Creatinine Clearance: 39.7 ml/min (by C-G formula based on Cr of 1.7).   LIVER  Recent Labs Lab 09/23/13 1742  AST 25  ALT 10  ALKPHOS 100  BILITOT 0.4  PROT 6.6  ALBUMIN 3.1*  INR 1.28     INFECTIOUS  Recent Labs Lab 09/23/13 1742 09/24/13 0249  PROCALCITON <0.10 <0.10  ENDOCRINE CBG (last 3)   Recent Labs  09/23/13 1717 09/23/13 2121 09/24/13 0728  GLUCAP 134* 179* 142*         IMAGING x48h No results found.    ASSESSMENT / PLAN:  Acute on Chronic respiratory failure in the setting of CAP vs ILD flare H/o GOLD D COPD (predominately by CAT scoring as FEV1 66%)  ILD felt to be related to RA. Has not been on cellcept since March 2015 d/t rising scr and recurrent office sick visits. Suspect that this is most likely infection but ILD flare on ddx as well.    - clinically better with antibiotics and steroids. No imaging data available. GFR somewhat low to handle CT angiogram  Plan Supplemental oxygen  High resolution CT chest to better define infiltrates Await  Sputum culture, Procalcitonin protocol, CBC, Sed rate, BNP, resp viral antigen panel, urine antigens   Empiric Levaquin and zosyn (psuedomonas risk and recent abx ~2 weeks ago) Scheduled BDs (will do brovana/budesonide and spiriva) Systemic steroids 80mg  IV q6 Flutter and IS for pulm hygiene  Get CXR  Get Duplex LE  Based on result of above, decide on HRCT wo contrast  CRI (BL scr 1.7 to 1.9)    - RN says not voided  Plan Gentle hydration Ck chemistry Pharmacy to dose meds Bladder scan and decide on foley  H/o CAD and HTN - MI ruled out Plan Continue home meds   H/o GERD Plan PPI    GLOBAL - wife not at bedside. Denies complaints. Move to floor but continous pulse ox   Dr. , M.D., [facility].C.P Pulmonary and Critical Care Medicine Staff Physician [place] [facility] [place] [facility] and Critical [facility] Pager: [phone], If no answer or between  15:00h - 7:00h: call [phone]  09/24/2013 10:42 AM

## 2013-09-24 NOTE — Progress Notes (Signed)
INITIAL NUTRITION ASSESSMENT  DOCUMENTATION CODES Per approved criteria  -Severe malnutrition in the context of chronic illness   INTERVENTION:  Ensure Complete PO TID, each supplement provides 350 kcal and 13 grams of protein  NUTRITION DIAGNOSIS: Malnutrition related to inadequate oral intake with chronic illness as evidenced by severe depletion of muscle mass and 6% weight loss within the past month.   Goal: Intake to meet >90% of estimated nutrition needs.  Monitor:  PO intake, labs, weight trend.  Reason for Assessment: MST  80 y.o. male  Admitting Dx: Acute on chronic respiratory failure   ASSESSMENT: 80 year old male w/ known h/o chronic resp failure on basis of ILD felt to be RA related. Admitted from doctor's office on 9/2 with acute on chronic respiratory failure.  Patient reports that he's had a poor appetite for the past week and has been eating very poorly. He has lost a lot of muscle mass, but unsure of usual weight.  Nutrition Focused Physical Exam:  Subcutaneous Fat:  Orbital Region: mild depletion Upper Arm Region: severe depletion Thoracic and Lumbar Region: NA  Muscle:  Temple Region: moderate depletion Clavicle Bone Region: severe depletion Clavicle and Acromion Bone Region: severe depletion Scapular Bone Region: NA Dorsal Hand: severe depletion Patellar Region: severe depletion Anterior Thigh Region: severe depletion Posterior Calf Region: severe depletion  Edema: none  Pt meets criteria for severe MALNUTRITION in the context of chronic illness as evidenced by 6% weight loss in the past month with severe depletion of muscle mass.  Height: Ht Readings from Last 1 Encounters:  09/23/13 6\' 1"  (1.854 m)    Weight: Wt Readings from Last 1 Encounters:  09/23/13 175 lb 8 oz (79.606 kg)    Ideal Body Weight: 83.6 kg  % Ideal Body Weight: 95%  Wt Readings from Last 10 Encounters:  09/23/13 175 lb 8 oz (79.606 kg)  09/07/13 186 lb (84.369  kg)  06/12/13 186 lb 3.2 oz (84.46 kg)  04/08/13 186 lb (84.369 kg)  03/18/13 179 lb 6.4 oz (81.375 kg)  03/03/13 188 lb 9.6 oz (85.548 kg)  01/13/13 185 lb (83.915 kg)  01/06/13 190 lb (86.183 kg)  11/03/12 186 lb 3.2 oz (84.46 kg)  07/03/12 176 lb 6.4 oz (80.015 kg)    Usual Body Weight: 186 lb 2 weeks ago  % Usual Body Weight: 94%  BMI:  Body mass index is 23.16 kg/(m^2).  Estimated Nutritional Needs: Kcal: 2200-2400 Protein: 110-120 gm Fluid: 2.2-2.4 L  Skin: no issues  Diet Order: General  EDUCATION NEEDS: -Education needs addressed, discussed ways to increase calorie and protein intake   Intake/Output Summary (Last 24 hours) at 09/24/13 1210 Last data filed at 09/24/13 1100  Gross per 24 hour  Intake 413.75 ml  Output    800 ml  Net -386.25 ml    Last BM: 8/31   Labs:   Recent Labs Lab 09/23/13 1742 09/24/13 0249  NA 140 139  K 5.0 5.2  CL 105 105  CO2 21 22  BUN 24* 26*  CREATININE 1.79* 1.70*  CALCIUM 8.6 8.6  MG 1.9  --   PHOS 2.6  --   GLUCOSE 125* 173*    CBG (last 3)   Recent Labs  09/23/13 1717 09/23/13 2121 09/24/13 0728  GLUCAP 134* 179* 142*    Scheduled Meds: . antiseptic oral rinse  7 mL Mouth Rinse BID  . arformoterol  15 mcg Nebulization Q12H  . aspirin EC  81 mg Oral Daily  .  budesonide (PULMICORT) nebulizer solution  0.25 mg Nebulization Q12H  . calcium carbonate  1 tablet Oral Daily  . heparin  5,000 Units Subcutaneous 3 times per day  . insulin aspart  0-15 Units Subcutaneous TID WC  . [START ON 09/25/2013] levofloxacin (LEVAQUIN) IV  750 mg Intravenous Q48H  . losartan  100 mg Oral Daily  . methylPREDNISolone (SOLU-MEDROL) injection  80 mg Intravenous Q6H  . metoprolol succinate  50 mg Oral Q breakfast  . mirtazapine  30 mg Oral QHS  . pantoprazole  40 mg Oral Q1200  . piperacillin-tazobactam (ZOSYN)  IV  3.375 g Intravenous 3 times per day  . simvastatin  40 mg Oral QHS  . tiotropium  18 mcg Inhalation Daily     Continuous Infusions: . sodium chloride 75 mL/hr at 09/23/13 1709    Past Medical History  Diagnosis Date  . Coronary atherosclerosis of unspecified type of vessel, native or graft   . Rheumatoid arthritis(714.0)   . Other emphysema   . Postinflammatory pulmonary fibrosis   . Acute bronchitis   . Chronic airway obstruction, not elsewhere classified     Past Surgical History  Procedure Laterality Date  . Stents with ami  2004  . Foot surgery      left  . Vericose vein surgery    . Aortic aneuyrsm thoracic      endovascular [place]    Joaquin Courts, RD, LDN, CNSC Pager [phone] After Hours Pager [phone]

## 2013-09-24 NOTE — Progress Notes (Signed)
Utilization review completed.  

## 2013-09-24 NOTE — Progress Notes (Signed)
Pt TX to 6N-30, VSS, called report.

## 2013-09-25 DIAGNOSIS — N289 Disorder of kidney and ureter, unspecified: Secondary | ICD-10-CM

## 2013-09-25 DIAGNOSIS — R609 Edema, unspecified: Secondary | ICD-10-CM

## 2013-09-25 DIAGNOSIS — E43 Unspecified severe protein-calorie malnutrition: Secondary | ICD-10-CM | POA: Insufficient documentation

## 2013-09-25 LAB — RESPIRATORY VIRUS PANEL
Adenovirus: NOT DETECTED
INFLUENZA A H3: NOT DETECTED
INFLUENZA A: NOT DETECTED
INFLUENZA B 1: NOT DETECTED
Influenza A H1: NOT DETECTED
Metapneumovirus: NOT DETECTED
PARAINFLUENZA 1 A: NOT DETECTED
Parainfluenza 2: NOT DETECTED
Parainfluenza 3: NOT DETECTED
RESPIRATORY SYNCYTIAL VIRUS A: NOT DETECTED
Respiratory Syncytial Virus B: NOT DETECTED
Rhinovirus: NOT DETECTED

## 2013-09-25 LAB — PROCALCITONIN: Procalcitonin: <0.1 ng/mL

## 2013-09-25 LAB — GLUCOSE, CAPILLARY
GLUCOSE-CAPILLARY: 216 mg/dL — AB (ref 70–99)
Glucose-Capillary: 164 mg/dL — ABNORMAL HIGH (ref 70–99)
Glucose-Capillary: 208 mg/dL — ABNORMAL HIGH (ref 70–99)
Glucose-Capillary: 225 mg/dL — ABNORMAL HIGH (ref 70–99)

## 2013-09-25 LAB — LEGIONELLA ANTIGEN, URINE: Legionella Antigen, Urine: NEGATIVE

## 2013-09-25 NOTE — Progress Notes (Signed)
Inpatient Diabetes Program Recommendations  AACE/ADA: New Consensus Statement on Inpatient Glycemic Control (2013)  Target Ranges:  Prepandial:   less than 140 mg/dL      Peak postprandial:   less than 180 mg/dL (1-2 hours)      Critically ill patients:  140 - 180 mg/dL   Reason for Assessment:  Elevated CBG's with IV steroids.   Results for Evan [name], Evan [name] (MRN [id number]) as of 09/25/2013 11:42  Ref. Range 09/24/2013 12:59 09/24/2013 16:40 09/24/2013 21:33 09/25/2013 07:35  Glucose-Capillary Latest Range: 70-99 mg/dL 950 (H) 932 (H) 671 (H) 164 (H)   Diabetes history: No history of diabetes  If CBG's remain greater than goal, may consider adding Levemir 10 units daily while on steroids.   Thanks, Beryl Meager, RN, BC-ADM Inpatient Diabetes Coordinator Pager [phone]

## 2013-09-25 NOTE — Progress Notes (Addendum)
Name: Evan [name] MRN: [id number] DOB: March 02, 1931    ADMISSION DATE:  09/23/2013   PRIMARY SERVICE:  PCCM   CHIEF COMPLAINT:  Acute on chronic respiratory failure   BRIEF PATIENT DESCRIPTION:    80 year old male f/b MR w/ known h/o chronic resp failure (O2 at 4 liters/pulsed) on basis of  ILD felt to be RA related (off cellcept since 03/2013 d/t rising Scr). Presented to the office on 9/2 for sick work-in. On presentation reported ~ 5-7 day h/o cough productive of clear to yellow colored sputum, w/ associated worsening dyspnea. At baseline he can ambulate the distance of [place] and [place] on presentation he is SOB at ~ 71ft.  He  Denies fever, denies chest pain, but has felt like his heart has been racing,  denies wheeze, denies sick exposures, denies swelling, denies light headedness, N or V.  On 4 liters pulsed O2 his saturations in the office were 71%, these raised to 80s on 4L cont O2. He was sent to [facility] stepdown unit as direct admit w/ working dx of acute on chronic resp failure on basis of CAP vs ILD flare.    CULTURES/ID workup: Sputum 9/2>>> u strep antigen 9/2>>> u legionella antigen 9/2>>> resp viral panel 9/2>>> admitc PCT 9/2>>>  ANTIBIOTICS: Zosyn 9/2>>> levaquin 9/2>>>    EVENTs  09/24/13: Feels significantly better. Says he thought he was dying yesterday. Now on 6L [place] at rest.  PCT and trop aer negative    SUBJECTIVE/OVERNIGHT/INTERVAL HX 09/25/13: no change. Still on 6L [place]. Duplex LE pending   VITAL SIGNS: Temp:  [97.5 F (36.4 C)-98.1 F (36.7 C)] 98.1 F (36.7 C) (09/04 0510) Pulse Rate:  [65-84] 65 (09/04 0510) Resp:  [16-20] 17 (09/04 0510) BP: (114-145)/(63-69) 114/66 mmHg (09/04 0510) SpO2:  [91 %-95 %] 95 % (09/04 0913)  PHYSICAL EXAMINATION: General:  Looks better Neuro:  Non-focal and intact  HEENT:  [place], no JVD, MMM  Cardiovascular:  Tachy rrr  Lungs:  Dry and diffuse velcro like rales  Abdomen: soft, non-tender  Musculoskeletal:  Intact   Skin:  Pale, 2 + pulses. LE appear a little mottled after ambulating   PULMONARY No results found for this basename: PHART, PCO2, PCO2ART, PO2, PO2ART, HCO3, TCO2, O2SAT,  in the last 168 hours  CBC  Recent Labs Lab 09/23/13 1742 09/24/13 0249  HGB 11.7* 11.4*  HCT 36.1* 35.8*  WBC 10.4 4.7  PLT 234 225    COAGULATION  Recent Labs Lab 09/23/13 1742  INR 1.28    CARDIAC    Recent Labs Lab 09/23/13 1742 09/23/13 2256 09/24/13 0249  TROPONINI <0.30 <0.30 <0.30    Recent Labs Lab 09/23/13 1742  PROBNP 290.5     CHEMISTRY  Recent Labs Lab 09/23/13 1742 09/24/13 0249  NA 140 139  K 5.0 5.2  CL 105 105  CO2 21 22  GLUCOSE 125* 173*  BUN 24* 26*  CREATININE 1.79* 1.70*  CALCIUM 8.6 8.6  MG 1.9  --   PHOS 2.6  --    Estimated Creatinine Clearance: 39.7 ml/min (by C-G formula based on Cr of 1.7).   LIVER  Recent Labs Lab 09/23/13 1742  AST 25  ALT 10  ALKPHOS 100  BILITOT 0.4  PROT 6.6  ALBUMIN 3.1*  INR 1.28     INFECTIOUS  Recent Labs Lab 09/23/13 1742 09/24/13 0249 09/25/13 0338  PROCALCITON <0.10 <0.10 <0.10     ENDOCRINE CBG (last 3)   Recent Labs  09/24/13 2133 09/25/13 0735 09/25/13 1156  GLUCAP 250* 164* 208*         IMAGING x48h Ct Chest High Resolution  09/25/2013   CLINICAL DATA:  Evaluate pulmonary infiltrates.  EXAM: CT CHEST WITHOUT CONTRAST  TECHNIQUE: Multidetector CT imaging of the chest was performed following the standard protocol without intravenous contrast. High resolution imaging of the lungs, as well as inspiratory and expiratory imaging, was performed.  COMPARISON:  09/04/2013, 07/09/2012 and 08/08/2011.  FINDINGS: Mediastinal lymph nodes measure up to 1.7 cm in the lower right paratracheal station, as before. Hilar regions are difficult to definitively evaluate without IV contrast. No axillary adenopathy. Atherosclerotic calcification of the arterial vasculature, including extensive  three-vessel involvement of the coronary arteries. Pulmonary arteries and heart are enlarged. No pericardial effusion.  Image quality is degraded by respiratory motion and expiratory phase imaging. Centrilobular emphysema. Superimposed basilar predominant subpleural reticulation, traction bronchiectasis/ bronchiolectasis, architectural distortion and honeycombing are seen in the inspiratory phase images. Findings have progressed from baseline examination of 08/08/2011. Overall increase in pulmonary parenchymal density is likely due to expiratory phase imaging. No air trapping. No pleural fluid. Airway is unremarkable.  Incidental imaging of the upper abdomen shows low attenuation lesions in the liver measuring up to 10 mm, as before and likely representative of cysts. Visualized portions of the liver, gallbladder, right adrenal gland, spleen, pancreas, stomach and bowel are otherwise grossly unremarkable. No upper abdominal adenopathy. No worrisome lytic or sclerotic lesions. Degenerative changes are seen in the spine.  IMPRESSION: 1. Pulmonary parenchymal pattern of fibrosis has progressed slightly from baseline examination of 08/08/2011 and is indicative of usual interstitial pneumonitis (UIP). 2. Three-vessel coronary artery calcification. 3. Enlarged pulmonary arteries, indicative of pulmonary arterial hypertension.   Electronically Signed   By: Leanna Battles M.D.   On: 09/25/2013 08:19   Dg Chest Port 1 View  09/24/2013   CLINICAL DATA:  Respiratory failure  EXAM: PORTABLE CHEST - 1 VIEW  COMPARISON:  CT chest 09/23/2013  FINDINGS: Chronic lung disease with interstitial fibrosis bilaterally, basilar predominant. Progression of bibasilar atelectasis/infiltrate since 03/18/2013. Possible pneumonia. Minimal left effusion.  IMPRESSION: Chronic lung disease with interstitial fibrosis.  Bibasilar airspace disease has progressed since the prior chest x-ray 03/18/2013 and could represent atelectasis or pneumonia.    Electronically Signed   By: Marlan Palau M.D.   On: 09/24/2013 11:27      ASSESSMENT / PLAN:  Acute on Chronic respiratory failure in the setting of CAP vs ILD flare H/o GOLD D COPD (predominately by CAT scoring as FEV1 66%)  ILD felt to be related to RA. Has not been on cellcept since March 2015 d/t rising scr and recurrent office sick visits. Suspect that this is most likely infection but ILD flare on ddx as well.    - clinically better with antibiotics and steroids as of 09/24/13 but plateau on 09/25/13. CT shows progressive ILD. PCT, BNP and trop are negative. GFR can only handle 80% dose for CT angiogram to rule out PE. Overall this is increasinglyu looking like UIP FLARE    Plan Supplemental oxygen  Await  Sputum culture,  resp viral antigen panel, urine antigens  Empiric zosyn (psuedomonas risk and recent abx ~2 weeks ago); will dc levaquin but taper rapidly due to negative PCT Scheduled BDs (will do brovana/budesonide and spiriva) Systemic steroids 80mg  IV q6 to continue Flutter and IS for pulm hygiene  Get Echo, D-dimer and  Duplex LE ; still pending -> if PE suspicion  is high then get CTA with 80% dose of dye   CRI (BL scr 1.7 to 1.9)    Intake/Output Summary (Last 24 hours) at 09/25/13 1221 Last data filed at 09/25/13 0935  Gross per 24 hour  Intake   1381 ml  Output    375 ml  Net   1006 ml     -  Plan Gentle hydration Checkk chemistry Pharmacy to dose meds Bladder scan and decide on foley  H/o CAD and HTN - MI ruled out Plan Continue home meds   H/o GERD Plan PPI    GLOBAL - wife not at bedside. Denies complaints. Keep in floor    Dr. Kalman Shan, M.D., [facility].C.P Pulmonary and Critical Care Medicine Staff Physician [place] [facility] [place] Pulmonary and Critical Care Pager: [phone], If no answer or between  15:00h - 7:00h: call [phone]  09/25/2013 12:17 PM

## 2013-09-25 NOTE — Progress Notes (Signed)
*  PRELIMINARY RESULTS* Vascular Ultrasound Bilateral lower extremity venous duplex has been completed.  Preliminary findings: Bilateral:  No evidence of DVT, superficial thrombosis, or Baker's Cyst.    Jennice Renegar FRANCES 09/25/2013, 4:23 PM

## 2013-09-26 DIAGNOSIS — I379 Nonrheumatic pulmonary valve disorder, unspecified: Secondary | ICD-10-CM

## 2013-09-26 LAB — GLUCOSE, CAPILLARY
GLUCOSE-CAPILLARY: 185 mg/dL — AB (ref 70–99)
GLUCOSE-CAPILLARY: 233 mg/dL — AB (ref 70–99)
GLUCOSE-CAPILLARY: 213 mg/dL — AB (ref 70–99)
Glucose-Capillary: 264 mg/dL — ABNORMAL HIGH (ref 70–99)

## 2013-09-26 LAB — EXPECTORATED SPUTUM ASSESSMENT W REFEX TO RESP CULTURE

## 2013-09-26 LAB — BASIC METABOLIC PANEL
Anion gap: 11 (ref 5–15)
BUN: 37 mg/dL — ABNORMAL HIGH (ref 6–23)
CALCIUM: 7.8 mg/dL — AB (ref 8.4–10.5)
CO2: 21 meq/L (ref 19–32)
Chloride: 112 mEq/L (ref 96–112)
Creatinine, Ser: 1.69 mg/dL — ABNORMAL HIGH (ref 0.50–1.35)
GFR calc Af Amer: 43 mL/min — ABNORMAL LOW (ref >90)
GFR calc non Af Amer: 37 mL/min — ABNORMAL LOW (ref >90)
GLUCOSE: 253 mg/dL — AB (ref 70–99)
POTASSIUM: 4.7 meq/L (ref 3.7–5.3)
Sodium: 144 mEq/L (ref 137–147)

## 2013-09-26 LAB — CBC
HEMATOCRIT: 30.4 % — AB (ref 39.0–52.0)
HEMOGLOBIN: 9.8 g/dL — AB (ref 13.0–17.0)
MCH: 30.5 pg (ref 26.0–34.0)
MCHC: 32.2 g/dL (ref 30.0–36.0)
MCV: 94.7 fL (ref 78.0–100.0)
Platelets: 233 10*3/uL (ref 150–400)
RBC: 3.21 MIL/uL — AB (ref 4.22–5.81)
RDW: 13.3 % (ref 11.5–15.5)
WBC: 13.8 10*3/uL — AB (ref 4.0–10.5)

## 2013-09-26 LAB — EXPECTORATED SPUTUM ASSESSMENT W GRAM STAIN, RFLX TO RESP C

## 2013-09-26 LAB — D-DIMER, QUANTITATIVE (NOT AT ARMC): D DIMER QUANT: 1.17 ug{FEU}/mL — AB (ref 0.00–0.48)

## 2013-09-26 MED ORDER — METHYLPREDNISOLONE SODIUM SUCC 40 MG IJ SOLR
40.0000 mg | Freq: Four times a day (QID) | INTRAMUSCULAR | Status: DC
  Administered 2013-09-26 – 2013-10-02 (×24): 40 mg via INTRAVENOUS
  Filled 2013-09-26 (×28): qty 1

## 2013-09-26 MED ORDER — METHYLPREDNISOLONE SODIUM SUCC 40 MG IJ SOLR: 40.0000 mg | Freq: Four times a day (QID) | INTRAMUSCULAR | Status: DC

## 2013-09-26 MED ORDER — METOPROLOL SUCCINATE ER 25 MG PO TB24
25.0000 mg | ORAL_TABLET | Freq: Every day | ORAL | Status: DC
  Administered 2013-09-27 – 2013-10-02 (×5): 25 mg via ORAL
  Filled 2013-09-26 (×7): qty 1

## 2013-09-26 NOTE — Progress Notes (Signed)
   Name: Evan [name] MRN: [id number] DOB: 1931/11/13    ADMISSION DATE:  09/23/2013   PRIMARY SERVICE:  PCCM   CHIEF COMPLAINT:  Acute on chronic respiratory failure   BRIEF PATIENT DESCRIPTION:    80 year old male f/b MR w/ known h/o chronic resp failure (O2 at 4 liters/pulsed) on basis of  ILD felt to be RA related (off cellcept since 03/2013 d/t rising Scr). Presented to the office on 9/2 for sick work-in. On presentation reported ~ 5-7 day h/o cough productive of clear to yellow colored sputum, w/ associated worsening dyspnea. At baseline he can ambulate the distance of [place] and [place] on presentation he is SOB at ~ 80ft.  He  Denies fever, denies chest pain, but has felt like his heart has been racing,  denies wheeze, denies sick exposures, denies swelling, denies light headedness, N or V.  On 4 liters pulsed O2 his saturations in the office were 71%, these raised to 80s on 4L cont O2. He was sent to [facility] stepdown unit as direct admit w/ working dx of acute on chronic resp failure on basis of CAP vs ILD flare.    CULTURES/ID workup: Sputum 9/2>>> u strep antigen 9/2>>> u legionella antigen 9/2>>> resp viral panel 9/2>>> admitc PCT 9/2>>>  ANTIBIOTICS: Zosyn 9/2>>> levaquin 9/2>>>    SUBJECTIVE/OVERNIGHT/INTERVAL HX Pt feels he is a little better.  Has not walked around room or in halls though.  LE dopplers neg, echo pending.   VITAL SIGNS: Temp:  [97.3 F (36.3 C)-97.8 F (36.6 C)] 97.3 F (36.3 C) (09/05 5053) Pulse Rate:  [54-76] 58 (09/05 0808) Resp:  [18-19] 18 (09/05 0613) BP: (112-126)/(64-69) 126/69 mmHg (09/05 0613) SpO2:  [92 %-97 %] 94 % (09/05 0743)  PHYSICAL EXAMINATION: General:  Thin male in nad Neuro:  Alert and oriented, moves all 4.  HEENT:  Nose without purulence, no LN or TMG Cardiovascular:  Mild regular tachy, no murmurs Lungs:  Crackles 1/3 up bilat, no wheezing Abdomen: soft, non-tender  LE without edema, no cyanosis    ASSESSMENT /  PLAN:  1) acute on chronic respiratory failure in a pt with known ILD.  Unclear whether this is superimposed infection, flare of ISLD, or possible cardiac component.  No DVT seen, and echo is pending.  Suspect is infectious, and pt appears to be slowly improving on abx.  Also getting steroids, but HRCT without classic GG c/w flare.  Does have progressive fibrosis however.  -wean steroids -continue IV abx, and f/u cultures. -f/u echo results. -need to mobilize -decrease lopressor in light of bradycardia to 40's even when awake at times.

## 2013-09-26 NOTE — Progress Notes (Signed)
  Echocardiogram 2D Echocardiogram has been performed.  Arvil Chaco 09/26/2013, 12:49 PM

## 2013-09-27 ENCOUNTER — Inpatient Hospital Stay (HOSPITAL_COMMUNITY): Payer: Medicare Other

## 2013-09-27 LAB — GLUCOSE, CAPILLARY
Glucose-Capillary: 158 mg/dL — ABNORMAL HIGH (ref 70–99)
Glucose-Capillary: 193 mg/dL — ABNORMAL HIGH (ref 70–99)
Glucose-Capillary: 180 mg/dL — ABNORMAL HIGH (ref 70–99)
Glucose-Capillary: 206 mg/dL — ABNORMAL HIGH (ref 70–99)

## 2013-09-27 MED ORDER — LORAZEPAM 2 MG/ML IJ SOLN
1.0000 mg | Freq: Once | INTRAMUSCULAR | Status: AC
Start: 2013-09-27 — End: 2013-09-27
  Administered 2013-09-27: 1 mg via INTRAVENOUS

## 2013-09-27 MED ORDER — LORAZEPAM 2 MG/ML IJ SOLN
INTRAMUSCULAR | Status: AC
  Filled 2013-09-27: qty 1

## 2013-09-27 NOTE — Progress Notes (Signed)
Patient sats in lower 70's. RT placed patient back on 55%VM, sats back up to 89-91%. Patients breathing is labored again. MD aware and writing orders to stepdown.

## 2013-09-27 NOTE — Progress Notes (Signed)
   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 Presented to the office on 9/2 for sick work-in. On presentation reported ~ 5-7 day h/o cough productive of clear to yellow colored sputum, w/ associated worsening dyspnea. At baseline he can ambulate the distance of [place] on presentation he is SOB at ~ 55ft.  He  Denies fever, denies chest pain, but has felt like his heart has been racing,  denies wheeze, denies sick exposures, denies swelling, denies light headedness, N or V.  On 4 liters pulsed O2 his saturations in the office were 71%, these raised to 80s on 4L cont O2. He was sent to [facility] stepdown unit as direct admit w/ working dx of acute on chronic resp failure on basis of CAP vs ILD flare.    CULTURES/ID workup: Sputum 9/2>>> u strep antigen 9/2>>> u legionella antigen 9/2>>> resp viral panel 9/2>>> admitc PCT 9/2>>>  ANTIBIOTICS: Zosyn 9/2>>> levaquin 9/2>>>    SUBJECTIVE/OVERNIGHT/INTERVAL HX Pt had issues overnight with falling sats, a little more congestion, and tells me that he had some blood tinged mucus with small clumps.  Not a large quantity by his history, and nursing did not comment in notes.  Appears stable this am with no increased wob, but is needing more oxygen.   VITAL SIGNS: Temp:  [97.5 F (36.4 C)-97.7 F (36.5 C)] 97.5 F (36.4 C) (09/06 1318) Pulse Rate:  [66-75] 75 (09/06 1318) Resp:  [18] 18 (09/06 1318) BP: (134-154)/(72-76) 134/73 mmHg (09/06 1318) SpO2:  [84 %-93 %] 84 % (09/06 1318) FiO2 (%):  [50 %] 50 % (09/06 0955)  PHYSICAL EXAMINATION: General:  Thin male in nad Neuro:  Alert and oriented, moves all 4.  HEENT:   Nose without purulence, no LN or TMG.  No definite epistaxis Cardiovascular:  Mild regular tachy, no murmurs Lungs:  Crackles 1/3 up bilat, no wheezing Abdomen: soft, non-tender  LE without edema, no cyanosis    ASSESSMENT / PLAN:  1) Acute on chronic respiratory failure in a pt with known ILD.  Unclear whether this is superimposed infection, flare of ISLD, or possible cardiac component.  No DVT seen, and echo shows only moderately dilated RV with moderate pulmonary hypertension.  Suspect is infectious, and pt appears to be slowly improving on abx.  Also getting steroids, but HRCT without classic GG c/w flare.  Does have progressive fibrosis however.  A new finding today is that of mild hemoptysis.  He is on asa and subq heparin, but will continue for now since pt is not having a lot of hemoptysis.  Will check a repeat cxr given increased FiO2 needs.  I wonder if hemoptysis is coming from epistaxis/PND with high flow oxygen drying out MM.   -check cxr today, and repeat bloodwork in am -continue IV abx, and f/u cultures. -need to mobilize as tolerated. -keep possibility of VTE in mind. May need ct angio depending upon renal function.

## 2013-09-27 NOTE — Progress Notes (Addendum)
RT called for rapid response for respiratory distress. Upon arrival, patient clearly having anxiety. States "he cant do this anymore, he is suffocating, he doesn't like being on surveillance" Patient reassured that we were trying to help him. CCM doctor told to put on 6 Lpm nasal cannula (home regimen). Sats consistently 86-87, with patient settling down some. MD and RN aware.

## 2013-09-27 NOTE — Progress Notes (Signed)
Pt had called me to the room, sats in the 70's and pt very anxious and stating "I cant do this anymore."  He had pulled off his mask.  I called Rapid response and respiratory to come to the room for assistance and assessment.  Dr. Herma Carson came to see the pt.  BP 162/84, pulse 112, afebrile.  Pt was given Ativan 1 mg per order from Dr. Herma Carson.  Resp tx given.  Pt was changed to O2 N/C at 6 liters.  Sats initially went up but then went back down into the 70's, Dr. Herma Carson was still on the floor and went in to talk with the pt, discussed advanced directives with the pt.  Will transfer pt to stepdown.

## 2013-09-27 NOTE — Progress Notes (Signed)
Patients sats low 80's on 6 Lpm nasal cannula, but states he is feeling better. MD aware, no interventions at this time. RN aware. RN to call if needed.

## 2013-09-27 NOTE — Significant Event (Addendum)
Rapid Response Event Note  Overview: Time Called: 1711 Arrival Time: 1715 Event Type: Respiratory  Initial Focused Assessment:  Called by Rn for patient with Respiratory distress.  Upon my arrival to patients room, RN at bedside.  Patient is sitting up in bed on VM 50%with increased WOB.  Patient appears to be very anxious, voices complaints of " being under surrviellance, connected to wires and machines, does not like oxygen mask and he states he can't stay here, cant do this anymore".   Reassured patient we are here to help him. RR 26, Spo2 84-88% on venti mask 50%.     Interventions:  Respiratory therapy at bedside, administered breathing treatment.  Breath Sounds bilaterally with good air movement.  162/84, Spo2 94%, RR 16-18.  Md paged and updated.  CCM, Dr. Herma Carson  at bedside  Venti mask changed to 6LPM with SPO2 86-88%   Event Summary:  Orders to give ativan, RN updated, Rn to call if assistance needed   at      at          [facility], Maryagnes Amos

## 2013-09-28 LAB — GLUCOSE, CAPILLARY
GLUCOSE-CAPILLARY: 152 mg/dL — AB (ref 70–99)
GLUCOSE-CAPILLARY: 156 mg/dL — AB (ref 70–99)
GLUCOSE-CAPILLARY: 176 mg/dL — AB (ref 70–99)
Glucose-Capillary: 159 mg/dL — ABNORMAL HIGH (ref 70–99)

## 2013-09-28 LAB — CBC
HCT: 34.8 % — ABNORMAL LOW (ref 39.0–52.0)
Hemoglobin: 10.9 g/dL — ABNORMAL LOW (ref 13.0–17.0)
MCH: 29.2 pg (ref 26.0–34.0)
MCHC: 31.3 g/dL (ref 30.0–36.0)
MCV: 93.3 fL (ref 78.0–100.0)
Platelets: 261 10*3/uL (ref 150–400)
RBC: 3.73 MIL/uL — ABNORMAL LOW (ref 4.22–5.81)
RDW: 13.7 % (ref 11.5–15.5)
WBC: 14.6 10*3/uL — AB (ref 4.0–10.5)

## 2013-09-28 LAB — BASIC METABOLIC PANEL
ANION GAP: 13 (ref 5–15)
BUN: 39 mg/dL — ABNORMAL HIGH (ref 6–23)
CALCIUM: 8.1 mg/dL — AB (ref 8.4–10.5)
CO2: 21 mEq/L (ref 19–32)
CREATININE: 1.52 mg/dL — AB (ref 0.50–1.35)
Chloride: 109 mEq/L (ref 96–112)
GFR calc Af Amer: 48 mL/min — ABNORMAL LOW (ref >90)
GFR calc non Af Amer: 42 mL/min — ABNORMAL LOW (ref >90)
Glucose, Bld: 166 mg/dL — ABNORMAL HIGH (ref 70–99)
Potassium: 4.9 mEq/L (ref 3.7–5.3)
Sodium: 143 mEq/L (ref 137–147)

## 2013-09-28 MED ORDER — LEVOFLOXACIN IN D5W 750 MG/150ML IV SOLN
750.0000 mg | INTRAVENOUS | Status: DC
  Administered 2013-09-28 – 2013-09-30 (×3): 750 mg via INTRAVENOUS
  Filled 2013-09-28 (×5): qty 150

## 2013-09-28 MED ORDER — SODIUM CHLORIDE 0.45 % IV SOLN
INTRAVENOUS | Status: DC
  Administered 2013-09-28: 12:00:00 via INTRAVENOUS

## 2013-09-28 NOTE — Progress Notes (Signed)
Pt doing well setting up in chair.  Placed on 5L [place]. And taken off bipap for a while.

## 2013-09-28 NOTE — Care Management Note (Signed)
    Page 1 of 2   10/02/2013     12:17:23 PM CARE MANAGEMENT NOTE 10/02/2013  Patient:  Evan [name], Evan [name]   Account Number:  [id number]  Date Initiated:  09/24/2013  Documentation initiated by:  Donn Pierini  Subjective/Objective Assessment:   Pt admitted with acute resp. distress-     Action/Plan:   PTA pt lived at home with spouse- on home 81- with [facility]   Anticipated DC Date:  09/26/2013   Anticipated DC Plan:        DC Planning Services  CM consult      Choice offered to / List presented to:             Status of service:  Completed, signed off Medicare Important Message given?  YES (If response is "NO", the following Medicare IM given date fields will be blank) Date Medicare IM given:  09/28/2013 Medicare IM given by:  Vance Peper Date Additional Medicare IM given:  10/01/2013 Additional Medicare IM given by:  Selicia Windom GRAVES-BIGELOW  Discharge Disposition:  LONG TERM ACUTE CARE (LTAC)  Per UR Regulation:  Reviewed for med. necessity/level of care/duration of stay  If discussed at Long Length of Stay Meetings, dates discussed:   09/29/2013  10/01/2013    Comments:  10-02-13 1208 Tomi Bamberger, RN,BSN [phone] CM did touch base with MD Vassie Loll this am in reference to disposition needs. Pt is agreeable to LTAC and he chose [facility]. Referral made and bed is available. Liaison for [facility] to speak with pt and plan for transfer today. Canary Brim made aware.  CM did speak with wife in ref to disposition plan as well. No further needs from CM at this time.   10-01-13 [address] [place] [phone] CM did call PA to see if would place order for LTAC referral. CM did speak to pt in ref to [facility] and pt is agreeable. CM did ask both [facility] and [facility] to look at pt. CM will continue to monitor in am to see if pt would be able to transfer to LTAC if bed available.   10-01-13 6004 Tomi Bamberger, RN,BSN [phone] CM did discuss pt in [facility] Meeting  today. Recommendations from Physician Advisor to see if pt appropraite for LTAC. CM will continue to monitor for disposition needs.  09-30-13 1445 Tomi Bamberger, RN,BSN [phone] Pt continues on IV antibiotic therapy- Possible HCAP per MD notes. CM will continue to monitor for disposition needs.  09-28-13 1454 Tomi Bamberger, RN,BSN [phone] WBC increasing- IV abx initiated 09-28-13. CM to monitor for disposition needs.

## 2013-09-28 NOTE — Progress Notes (Signed)
Called by secretary to check on pt.  On arrival into room pt had pulled off BIPAP, taken off EKG leads and  pulse oximiter,  pulled out PIV, was incontinent of urine and repeatedly stated  " I'm done with this" further clariflying that he  does not want to wear the BIPAP because it blows in his eyes. Has been irritable all night since initiation of BIPAP.  Given Restoril 15 mg at 2200 with effect and good sleep.    BIPAP replaced, reconnected to pusle ox and cardiac monitor,.  Cleaned, and linen changed.  IV restarted by IV team.  Given call bell, urinal and bed alarm.   Spent a good deal of time discussing importance of BIPAP for pt condition.  He did agree to not remove BIPAP the rest of the night, returned to sleep.  158/85  66 NSR 18 100% on Bipap 70 %.Evan [name]

## 2013-09-28 NOTE — Progress Notes (Signed)
   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 Presented to the office on 9/2 for sick work-in. On presentation reported ~ 5-7 day h/o cough productive of clear to yellow colored sputum, w/ associated worsening dyspnea. At baseline he can ambulate the distance of [place] and [place] on presentation he is SOB at ~ 28ft.  He  Denies fever, denies chest pain, but has felt like his heart has been racing,  denies wheeze, denies sick exposures, denies swelling, denies light headedness, N or V.  On 4 liters pulsed O2 his saturations in the office were 71%, these raised to 80s on 4L cont O2. He was sent to [facility] stepdown unit as direct admit w/ working dx of acute on chronic resp failure on basis of CAP vs ILD flare.    CULTURES/ID workup: Sputum 9/2>>> u strep antigen 9/2>>> u legionella antigen 9/2>>> resp viral panel 9/2>>> admitc PCT 9/2>>>  ANTIBIOTICS: Zosyn 9/2>>> levaquin 9/2>>>    SUBJECTIVE/OVERNIGHT/INTERVAL HX Pt had worsening sob overnight and moved to stepdown ICU bed.  He tells me that he became anxious and claustrophobic, which then led to increased sob.  Moved to stepdown for as needed bipap.  Currently in chair on partial nrb and very little increased wob.   VITAL SIGNS: Temp:  [97.1 F (36.2 C)-97.7 F (36.5 C)] 97.5 F (36.4 C) (09/07 1157) Pulse Rate:  [62-112] 62 (09/07 0826) Resp:  [18-30] 20 (09/07 0826) BP: (134-165)/(73-95) 149/86 mmHg (09/07 0826) SpO2:  [67 %-100 %] 100 % (09/07 0839) FiO2 (%):  [55 %-70 %] 70 % (09/07 0839)  PHYSICAL EXAMINATION: General:  Thin male in nad, no significant increased wob Neuro:  Alert and oriented, moves all 4.    HEENT:  Nose without purulence, no LN or TMG.  No definite epistaxis Cardiovascular:  Mild regular tachy, no murmurs Lungs:  Crackles 1/3 up bilat, no wheezing or rhonchi Abdomen: soft, non-tender  LE without significant edema, no cyanosis    ASSESSMENT / PLAN:  1) Acute on chronic respiratory failure in a pt with known ILD. The working diagnosis is superimposed infection since echo unimpressive and LE without DVT.  No GGO on f/u ct chest.  He has been on zosyn, and was on levaquin as well upon admit.  This has been discontinued, and he does not seem to be doing as well.  His white count is also increasing.  Sputum culture pending.  I would be in favor of adding levaquin back to see if he improves.  -check cxr in am, add levaquin back.  -continue IV abx, and f/u cultures. -need to mobilize as tolerated.

## 2013-09-28 NOTE — Progress Notes (Signed)
Pt not ready at this time to go on BiPAP, will put pt on when ready. Instructed pt. To let RN know when ready and RT would place him on BiPAP

## 2013-09-28 NOTE — Progress Notes (Signed)
Checking in on pt and he is sleeping well with sats at 90 or greater via Venturi Mask, RT will check on pt again later to see if pt may want to go on the BiPAP later in the evening.

## 2013-09-29 ENCOUNTER — Inpatient Hospital Stay (HOSPITAL_COMMUNITY): Payer: Medicare Other

## 2013-09-29 DIAGNOSIS — J9600 Acute respiratory failure, unspecified whether with hypoxia or hypercapnia: Secondary | ICD-10-CM

## 2013-09-29 LAB — CULTURE, RESPIRATORY W GRAM STAIN: Gram Stain: NONE SEEN

## 2013-09-29 LAB — GLUCOSE, CAPILLARY
GLUCOSE-CAPILLARY: 190 mg/dL — AB (ref 70–99)
GLUCOSE-CAPILLARY: 215 mg/dL — AB (ref 70–99)
Glucose-Capillary: 255 mg/dL — ABNORMAL HIGH (ref 70–99)
Glucose-Capillary: 218 mg/dL — ABNORMAL HIGH (ref 70–99)

## 2013-09-29 LAB — CULTURE, RESPIRATORY

## 2013-09-29 NOTE — Progress Notes (Signed)
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 Presented to the office on 9/2 for sick work-in. On presentation reported ~ 5-7 day h/o cough productive of clear to yellow colored sputum, w/ associated worsening dyspnea. At baseline he can ambulate the distance of [place] and [place] on presentation he is SOB at ~ 50ft.  He  Denies fever, denies chest pain, but has felt like his heart has been racing,  denies wheeze, denies sick exposures, denies swelling, denies light headedness, N or V.  On 4 liters pulsed O2 his saturations in the office were 71%, these raised to 80s on 4L cont O2. He was sent to [facility] stepdown unit as direct admit w/ working dx of acute on chronic resp failure on basis of CAP vs ILD flare.    CULTURES/ID workup: Sputum 9/2>>> u strep antigen 9/2>>> u legionella antigen 9/2>>> resp viral panel 9/2>>> admitc PCT 9/2>>>  ANTIBIOTICS: Zosyn 9/2>>> levaquin 9/2>>>    EVENTs  09/24/13: Feels significantly better. Says he thought he was dying yesterday. Now on 6L [place] at rest.  PCT and trop aer negative    SUBJECTIVE/OVERNIGHT/INTERVAL HX 09/25/13: no change. Still on 6L [place]. Duplex LE pending   VITAL SIGNS: Temp:  [97.3 F (36.3 C)-98 F (36.7 C)] 97.4 F (36.3 C) (09/08 1128) Pulse Rate:  [53-76] 76 (09/08 1134) Resp:  [17-26] 25 (09/08 1134) BP: (119-134)/(67-83) 130/70 mmHg (09/08 1134) SpO2:  [90 %-93 %] 90 % (09/08 1134) FiO2 (%):  [55 %] 55 % (09/08 1134)  PHYSICAL EXAMINATION: General:  Looks better Neuro:  Non-focal and intact  HEENT:  [place], no JVD, MMM  Cardiovascular:  Tachy rrr  Lungs:  Dry and diffuse velcro like rales  Abdomen: soft,  non-tender  Musculoskeletal:  Intact  Skin:  Pale, 2 + pulses. LE appear a little mottled after ambulating   PULMONARY No results found for this basename: PHART, PCO2, PCO2ART, PO2, PO2ART, HCO3, TCO2, O2SAT,  in the last 168 hours  CBC  Recent Labs Lab 09/24/13 0249 09/26/13 0123 09/28/13 0338  HGB 11.4* 9.8* 10.9*  HCT 35.8* 30.4* 34.8*  WBC 4.7 13.8* 14.6*  PLT 225 233 261    COAGULATION  Recent Labs Lab 09/23/13 1742  INR 1.28    CARDIAC    Recent Labs Lab 09/23/13 1742 09/23/13 2256 09/24/13 0249  TROPONINI <0.30 <0.30 <0.30    Recent Labs Lab 09/23/13 1742  PROBNP 290.5     CHEMISTRY  Recent Labs Lab 09/23/13 1742 09/24/13 0249 09/26/13 0123 09/28/13 0338  NA 140 139 144 143  K 5.0 5.2 4.7 4.9  CL 105 105 112 109  CO2 21 22 21 21   GLUCOSE 125* 173* 253* 166*  BUN 24* 26* 37* 39*  CREATININE 1.79* 1.70* 1.69* 1.52*  CALCIUM 8.6 8.6 7.8* 8.1*  MG 1.9  --   --   --   PHOS 2.6  --   --   --    Estimated Creatinine Clearance: 44.4 ml/min (by C-G formula based on Cr of 1.52).   LIVER  Recent Labs Lab 09/23/13 1742  AST 25  ALT 10  ALKPHOS 100  BILITOT 0.4  PROT 6.6  ALBUMIN 3.1*  INR 1.28     INFECTIOUS  Recent Labs Lab 09/23/13 1742 09/24/13 0249 09/25/13 0338  PROCALCITON <0.10 <0.10 <0.10     ENDOCRINE CBG (last 3)   Recent Labs  09/28/13 2155 09/29/13 0756 09/29/13 1131  GLUCAP 176* 190* 255*         IMAGING x48h Dg Chest Port 1 View  09/29/2013   CLINICAL DATA:  Respiratory failure. Known interstitial lung disease.  EXAM: PORTABLE CHEST - 1 VIEW  COMPARISON:  09/27/2013  FINDINGS: Irregular interstitial opacities, seen heterogeneously and most evident in the lung bases, are stable. There is patchy airspace opacity in the lung bases, greater on the right, similar to the most recent prior exam, but increased when compared to a study dated 09/24/2013. This may be due to superimposed atelectasis or  superimposed pneumonia in the setting of extensive interstitial fibrosis.  No pneumothorax.  Cardiac silhouette is normal in size.  IMPRESSION: Findings are without substantial change from the most recent prior study. Advanced interstitial lung disease. Suspect superimposed lung base pneumonia versus superimposed atelectasis.   Electronically Signed   By: Amie Portland M.D.   On: 09/29/2013 08:09   Dg Chest Port 1 View  09/27/2013   CLINICAL DATA:  Worsening hypoxemia with hemoptysis.  EXAM: PORTABLE CHEST - 1 VIEW  COMPARISON:  09/24/2013 and 03/18/2013  FINDINGS: Lungs are hypoinflated with continued mixed interstitial airspace density over the mid to lower lungs bilaterally which has progressed compared to fat were 2015 although is unchanged from 09/24/2013. Stable cardiomegaly. Remainder of the exam is unchanged.  IMPRESSION: Stable mixed interstitial airspace density over the mid to lower lungs mostly chronic although cannot exclude superimposed acute infection.   Electronically Signed   By: Elberta Fortis M.D.   On: 09/27/2013 18:47      ASSESSMENT / PLAN:  Acute on Chronic respiratory failure in the setting of CAP vs UIP flare H/o GOLD D COPD (predominately by CAT scoring as FEV1 66%)  ILD felt to be related to RA. Has not been on cellcept since March 2015 d/t rising scr and recurrent office sick visits. Suspect that this is most likely infection but ILD flare on ddx as well.     Plan Supplemental oxygen  Continue steroids > this may take days to improve Continue empiric antibiotics given worsening RLL infiltrate Scheduled BDs (will do brovana/budesonide and spiriva) Systemic steroids 40mg  IV q6 to continue Flutter and IS for pulm hygiene   CRI (BL scr 1.7 to 1.9)  At baseline   Intake/Output Summary (Last 24 hours) at 09/29/13 1443 Last data filed at 09/29/13 1200  Gross per 24 hour  Intake    900 ml  Output   1425 ml  Net   -525 ml     -  Plan D/C IV fluids Daily  BMET Pharmacy to dose meds  H/o CAD and HTN - MI ruled out Plan Continue home meds   H/o GERD Plan PPI    GLOBAL - minimal progress but arguably not worsening; suspect UIP flare, also likely RLL HCAP - continue zosyn/levaquin, add vanc if febrile - maintain in SDU - suspect any improvement may take days - if worsens would he would not benefit from mechanical ventilation, I started this conversation with him today  11/29/13, MD [place] PCCM Pager: [phone] Cell: [phone] If no response, call [phone]     09/29/2013 2:43 PM

## 2013-09-30 DIAGNOSIS — N189 Chronic kidney disease, unspecified: Secondary | ICD-10-CM

## 2013-09-30 LAB — GLUCOSE, CAPILLARY
GLUCOSE-CAPILLARY: 181 mg/dL — AB (ref 70–99)
GLUCOSE-CAPILLARY: 194 mg/dL — AB (ref 70–99)
Glucose-Capillary: 229 mg/dL — ABNORMAL HIGH (ref 70–99)
Glucose-Capillary: 184 mg/dL — ABNORMAL HIGH (ref 70–99)

## 2013-09-30 LAB — CULTURE, BLOOD (ROUTINE X 2)
CULTURE: NO GROWTH
Culture: NO GROWTH

## 2013-09-30 MED ORDER — INSULIN GLARGINE 100 UNIT/ML ~~LOC~~ SOLN
10.0000 [IU] | Freq: Every day | SUBCUTANEOUS | Status: DC
  Administered 2013-09-30: 10 [IU] via SUBCUTANEOUS
  Filled 2013-09-30 (×2): qty 0.1

## 2013-09-30 MED ORDER — SENNOSIDES-DOCUSATE SODIUM 8.6-50 MG PO TABS
2.0000 | ORAL_TABLET | Freq: Two times a day (BID) | ORAL | Status: DC
  Administered 2013-09-30 – 2013-10-02 (×4): 2 via ORAL
  Filled 2013-09-30 (×5): qty 2

## 2013-09-30 MED ORDER — FUROSEMIDE 10 MG/ML IJ SOLN
40.0000 mg | Freq: Two times a day (BID) | INTRAMUSCULAR | Status: DC
  Administered 2013-09-30 – 2013-10-01 (×2): 40 mg via INTRAVENOUS
  Filled 2013-09-30 (×4): qty 4

## 2013-09-30 NOTE — Progress Notes (Signed)
Name: Evan [name] MRN: [id number] DOB: 1931/03/17    ADMISSION DATE:  09/23/2013   PRIMARY SERVICE:  PCCM   CHIEF COMPLAINT:  Acute on chronic respiratory failure   BRIEF PATIENT DESCRIPTION:    80 year old male f/b MR w/ known h/o chronic resp failure (O2 at 4 liters/pulsed) on basis of  ILD felt to be RA related (off cellcept since 03/2013 d/t rising Scr). Presented to the office on 9/2 for sick work-in. On presentation reported ~ 5-7 day h/o cough productive of clear to yellow colored sputum, w/ associated worsening dyspnea. At baseline he can ambulate the distance of [place] and [place] on presentation he is SOB at ~ 53ft.  He  Denies fever, denies chest pain, but has felt like his heart has been racing,  denies wheeze, denies sick exposures, denies swelling, denies light headedness, N or V.  On 4 liters pulsed O2 his saturations in the office were 71%, these raised to 80s on 4L cont O2. He was sent to [facility] stepdown unit as direct admit w/ working dx of acute on chronic resp failure on basis of CAP vs ILD flare.   CULTURES/ID workup: Sputum 9/2> Neg Blood 9/2 > Neg u strep antigen 9/2> Neg u legionella antigen 9/2>Neg resp viral panel 9/2>Neg  ANTIBIOTICS: Zosyn 9/2>>> levaquin 9/2>>>  EVENTs 09/24/13: Feels significantly better. 6L [place] at rest.  PCT and trop aer negative  SUBJECTIVE/OVERNIGHT/INTERVAL HX 9/9: Feels "much better" today. Still on 6L venturi mask with sats 88% at rest. Worried that he will die here.    VITAL SIGNS: Temp:  [97.2 F (36.2 C)-97.8 F (36.6 C)] 97.5 F (36.4 C) (09/09 0713) Pulse Rate:  [76-91] 80 (09/09 0713) Resp:  [23-25] 24 (09/09 0713) BP: (130-147)/(70-96) 139/84 mmHg (09/09 0713) SpO2:  [90 %-94 %] 91 % (09/09 0944) FiO2 (%):  [50 %-60 %] 55 % (09/09 0944)  PHYSICAL EXAMINATION: General: elderly male in NAD Neuro:  Alert, oriented, no focal deficit HEENT:  [place], no JVD, MMM  Cardiovascular:  Borderline tachy, regular rhythm  Lungs:   Dry and diffuse velcro like rales  Abdomen: soft, non-tender  Musculoskeletal:  No acute deformity or ROM limitation Skin:  Pale, 2 + pulses. Intact. MMM  PULMONARY No results found for this basename: PHART, PCO2, PCO2ART, PO2, PO2ART, HCO3, TCO2, O2SAT,  in the last 168 hours  CBC  Recent Labs Lab 09/24/13 0249 09/26/13 0123 09/28/13 0338  HGB 11.4* 9.8* 10.9*  HCT 35.8* 30.4* 34.8*  WBC 4.7 13.8* 14.6*  PLT 225 233 261    Recent Labs Lab 09/23/13 1742 09/24/13 0249 09/26/13 0123 09/28/13 0338  NA 140 139 144 143  K 5.0 5.2 4.7 4.9  CL 105 105 112 109  CO2 21 22 21 21   GLUCOSE 125* 173* 253* 166*  BUN 24* 26* 37* 39*  CREATININE 1.79* 1.70* 1.69* 1.52*  CALCIUM 8.6 8.6 7.8* 8.1*  MG 1.9  --   --   --   PHOS 2.6  --   --   --    Estimated Creatinine Clearance: 44.4 ml/min (by C-G formula based on Cr of 1.52).  ENDOCRINE CBG (last 3)   Recent Labs  09/29/13 1607 09/29/13 2221 09/30/13 0733  GLUCAP 215* 218* 181*   IMAGING x48h Dg Chest Port 1 View  09/29/2013   CLINICAL DATA:  Respiratory failure. Known interstitial lung disease.  EXAM: PORTABLE CHEST - 1 VIEW  COMPARISON:  09/27/2013  FINDINGS: Irregular interstitial opacities, seen  heterogeneously and most evident in the lung bases, are stable. There is patchy airspace opacity in the lung bases, greater on the right, similar to the most recent prior exam, but increased when compared to a study dated 09/24/2013. This may be due to superimposed atelectasis or superimposed pneumonia in the setting of extensive interstitial fibrosis.  No pneumothorax.  Cardiac silhouette is normal in size.  IMPRESSION: Findings are without substantial change from the most recent prior study. Advanced interstitial lung disease. Suspect superimposed lung base pneumonia versus superimposed atelectasis.   Electronically Signed   By: Amie Portland M.D.   On: 09/29/2013 08:09   ASSESSMENT / PLAN:  Acute on Chronic respiratory failure in  the setting of CAP vs UIP flare H/o GOLD D COPD (predominately by CAT scoring as FEV1 66%)  ILD felt to be related to RA. Has not been on cellcept since March 2015 d/t rising scr and recurrent office sick visits. Suspect that this is most likely infection but ILD flare on ddx as well.   Plan -Supplemental oxygen  -Systemic steroids 40mg  IV q6 to continue -Continue empiric antibiotics given worsening RLL infiltrate (Zosyn, Levaquin), but if continues to make clinical progress will narrow -Re-eval CXR 9/10 am -Scheduled BDs ( brovana/budesonide and spiriva) -Flutter and IS for pulm hygiene -edema on top, consider lasix   CRI (BL scr 1.7 to 1.9)  At baseline Plan -D/C IV fluids -Daily BMET -Pharmacy to dose meds lasix  H/o CAD and HTN - MI ruled out Plan -Continue home meds (cozaar, metoprolol, simvastatin)  H/o GERD Plan -PPI  -Heart healthy, carb modified diet  Hyperglycemia (consistently greater than 180 last 48 hours even with SSI coverage) Plan -Continue CBG checks and SSI -Add Lantus 10 units HS 9/9  GLOBAL - minimal progress but arguably not worsening; suspect UIP flare, also likely RLL HCAP - maintain in SDU - suspect any improvement may take days - if worsens would he would not benefit from mechanical ventilation -add lasix  , ACNP [place] [facility] Pager [phone] or [phone]   I have fully examined this patient and agree with above findings.     [phone]. Mcarthur Rossetti, MD, FACP Pgr: [phone] [facility]

## 2013-09-30 NOTE — Progress Notes (Signed)
Pt. Refuses BIPAP at this time. Pt. Is aware to let RT or RN know anytime during the night if he changes his mind & feels that he needs BIPAP. BIPAP is at the pt.'s bedside on standby.

## 2013-10-01 ENCOUNTER — Inpatient Hospital Stay (HOSPITAL_COMMUNITY): Payer: Medicare Other

## 2013-10-01 LAB — BASIC METABOLIC PANEL
Anion gap: 12 (ref 5–15)
BUN: 60 mg/dL — ABNORMAL HIGH (ref 6–23)
CO2: 27 meq/L (ref 19–32)
CREATININE: 1.78 mg/dL — AB (ref 0.50–1.35)
Calcium: 8.4 mg/dL (ref 8.4–10.5)
Chloride: 101 mEq/L (ref 96–112)
GFR calc Af Amer: 40 mL/min — ABNORMAL LOW (ref >90)
GFR calc non Af Amer: 35 mL/min — ABNORMAL LOW (ref >90)
GLUCOSE: 224 mg/dL — AB (ref 70–99)
Potassium: 4.9 mEq/L (ref 3.7–5.3)
Sodium: 140 mEq/L (ref 137–147)

## 2013-10-01 LAB — GLUCOSE, CAPILLARY
GLUCOSE-CAPILLARY: 209 mg/dL — AB (ref 70–99)
Glucose-Capillary: 236 mg/dL — ABNORMAL HIGH (ref 70–99)
Glucose-Capillary: 223 mg/dL — ABNORMAL HIGH (ref 70–99)
Glucose-Capillary: 221 mg/dL — ABNORMAL HIGH (ref 70–99)

## 2013-10-01 LAB — CBC
HEMATOCRIT: 35.2 % — AB (ref 39.0–52.0)
Hemoglobin: 11.5 g/dL — ABNORMAL LOW (ref 13.0–17.0)
MCH: 30.3 pg (ref 26.0–34.0)
MCHC: 32.7 g/dL (ref 30.0–36.0)
MCV: 92.9 fL (ref 78.0–100.0)
Platelets: 255 10*3/uL (ref 150–400)
RBC: 3.79 MIL/uL — ABNORMAL LOW (ref 4.22–5.81)
RDW: 14.1 % (ref 11.5–15.5)
WBC: 16.5 10*3/uL — ABNORMAL HIGH (ref 4.0–10.5)

## 2013-10-01 LAB — PHOSPHORUS: Phosphorus: 4.6 mg/dL (ref 2.3–4.6)

## 2013-10-01 LAB — MAGNESIUM: Magnesium: 2.4 mg/dL (ref 1.5–2.5)

## 2013-10-01 MED ORDER — POLYETHYLENE GLYCOL 3350 17 G PO PACK
17.0000 g | PACK | Freq: Every day | ORAL | Status: DC
  Administered 2013-10-01 – 2013-10-02 (×2): 17 g via ORAL
  Filled 2013-10-01 (×2): qty 1

## 2013-10-01 MED ORDER — INSULIN GLARGINE 100 UNIT/ML ~~LOC~~ SOLN
15.0000 [IU] | Freq: Every day | SUBCUTANEOUS | Status: DC
  Administered 2013-10-01: 15 [IU] via SUBCUTANEOUS
  Filled 2013-10-01 (×2): qty 0.15

## 2013-10-01 MED ORDER — BISACODYL 10 MG RE SUPP: 10.0000 mg | Freq: Every day | RECTAL | Status: DC | PRN

## 2013-10-01 MED ORDER — LEVOFLOXACIN IN D5W 750 MG/150ML IV SOLN
750.0000 mg | INTRAVENOUS | Status: DC
  Filled 2013-10-01: qty 150

## 2013-10-01 NOTE — Progress Notes (Addendum)
NUTRITION FOLLOW UP  DOCUMENTATION CODES  Per approved criteria   -Severe malnutrition in the context of chronic illness    Pt meets criteria for severe MALNUTRITION in the context of chronic illness as evidenced by 6% weight loss in the past month with severe depletion of muscle mass.  Intervention:    Continue Ensure Complete PO TID, each supplement provides 350 kcal and 13 grams of protein  Continue Ensure Pudding BID with meds prn  Nutrition Dx:   Malnutrition related to inadequate oral intake with chronic illness as evidenced by severe depletion of muscle mass and 6% weight loss within the past month. Ongoing.  Goal:   Intake to meet >90% of estimated nutrition needs. Met.  Monitor:   PO intake, labs, weight trend.  Assessment:   80 year old male w/ known h/o chronic resp failure on basis of ILD felt to be RA related. Admitted from [place] office on 9/2 with acute on chronic respiratory failure.  PO intake greatly improved, patient is consuming 100% of meals plus Ensure supplements 2-3 times per day. Current intake is adequate to meet nutrition needs.  Height: Ht Readings from Last 1 Encounters:  09/23/13 $RemoveB'6\' 1"'XCUMlzzW$  (1.854 m)    Weight Status:   Wt Readings from Last 1 Encounters:  09/23/13 175 lb 8 oz (79.606 kg)    Re-estimated needs:  Kcal: 2200-2400  Protein: 110-120 gm  Fluid: 2.2-2.4 L  Skin: no issues  Diet Order:  Heart Healthy, CHO-modified with Ensure Complete PO TID and Ensure Pudding BID prn with medications.    Intake/Output Summary (Last 24 hours) at 10/01/13 0930 Last data filed at 10/01/13 0800  Gross per 24 hour  Intake   1740 ml  Output   3650 ml  Net  -1910 ml    Last BM: unknown   Labs:   Recent Labs Lab 09/26/13 0123 09/28/13 0338 10/01/13 0500  NA 144 143 140  K 4.7 4.9 4.9  CL 112 109 101  CO2 $Re'21 21 27  'egy$ BUN 37* 39* 60*  CREATININE 1.69* 1.52* 1.78*  CALCIUM 7.8* 8.1* 8.4  MG  --   --  2.4  PHOS  --   --  4.6  GLUCOSE  253* 166* 224*    CBG (last 3)   Recent Labs  09/30/13 1600 09/30/13 2208 10/01/13 0720  GLUCAP 184* 194* 209*    Scheduled Meds: . antiseptic oral rinse  7 mL Mouth Rinse BID  . arformoterol  15 mcg Nebulization Q12H  . aspirin EC  81 mg Oral Daily  . budesonide (PULMICORT) nebulizer solution  0.25 mg Nebulization Q12H  . calcium carbonate  1 tablet Oral Daily  . feeding supplement (ENSURE COMPLETE)  237 mL Oral TID BM  . heparin  5,000 Units Subcutaneous 3 times per day  . insulin aspart  0-15 Units Subcutaneous TID WC  . insulin glargine  15 Units Subcutaneous QHS  . [START ON 10/02/2013] levofloxacin (LEVAQUIN) IV  750 mg Intravenous Q48H  . losartan  100 mg Oral Daily  . methylPREDNISolone (SOLU-MEDROL) injection  40 mg Intravenous Q6H  . metoprolol succinate  25 mg Oral Q breakfast  . mirtazapine  30 mg Oral QHS  . pantoprazole  40 mg Oral Q1200  . piperacillin-tazobactam (ZOSYN)  IV  3.375 g Intravenous 3 times per day  . polyethylene glycol  17 g Oral Daily  . senna-docusate  2 tablet Oral BID  . simvastatin  40 mg Oral QHS  . tiotropium  18 mcg Inhalation Daily    Continuous Infusions:    Molli Barrows, RD, LDN, [place] Pager [phone] After Hours Pager [phone]

## 2013-10-01 NOTE — Progress Notes (Signed)
Name: Evan [name] MRN: [id number] DOB: 1931/07/12    ADMISSION DATE:  09/23/2013   PRIMARY SERVICE:  PCCM   CHIEF COMPLAINT:  Acute on chronic respiratory failure   BRIEF PATIENT DESCRIPTION:   80 year old male with h/o chronic resp failure (O2 at 4 liters/pulsed) on basis of  ILD felt to be RA related (off cellcept since 03/2013 d/t rising Scr).  Admitted 9/2 from office with cough, worsening SOB.  Initially required 100% NRB to maintain sats 88-92. Rx for CAP v ILD flare.  Was improving and tx out to tele, but had worsening SOB 9/6 and back to SDU.   CULTURES/ID workup: Sputum 9/2> Neg Blood 9/2 > Neg u strep antigen 9/2> Neg u legionella antigen 9/2>Neg resp viral panel 9/2>Neg  ANTIBIOTICS: Zosyn 9/2>>> levaquin 9/2>>>  EVENTs 09/24/13: Feels significantly better. 6L [place] at rest.  PCT and trop aer negative 9/5 to tele  9/6 Rapid response, worsening SOB, hypoxia, back to SDU  SUBJECTIVE/OVERNIGHT/INTERVAL HX C/o constipation. Very agitated.  Doesn't want me to interrupt his breakfast.  "I feel absolutely no better since I got here:[name] Kitchen  Mild cough satn 83-87 on 6l [place]   VITAL SIGNS: Temp:  [97.3 F (36.3 C)-98.1 F (36.7 C)] 97.5 F (36.4 C) (09/10 0700) Pulse Rate:  [80-104] 83 (09/10 0705) Resp:  [17-29] 20 (09/10 0705) BP: (130-150)/(80-85) 130/83 mmHg (09/10 0705) SpO2:  [90 %-92 %] 92 % (09/09 2017) FiO2 (%):  [55 %-60 %] 55 % (09/09 2017)  PHYSICAL EXAMINATION: General: elderly male in NAD Neuro:  Alert, oriented, no focal deficit, agitated, wants me to leave so he can eat HEENT:  [place], no JVD, MMM  Cardiovascular:  Borderline tachy, regular rhythm  Lungs:  resps even non labored on 6L [place], sats 86%, scattered rales Abdomen: soft, non-tender  Musculoskeletal:  Warm and dry, 2+ BLE edema   PULMONARY No results found for this basename: PHART, PCO2, PCO2ART, PO2, PO2ART, HCO3, TCO2, O2SAT,  in the last 168 hours  CBC  Recent Labs Lab 09/26/13 0123  09/28/13 0338 10/01/13 0500  HGB 9.8* 10.9* 11.5*  HCT 30.4* 34.8* 35.2*  WBC 13.8* 14.6* 16.5*  PLT 233 261 255    Recent Labs Lab 09/26/13 0123 09/28/13 0338 10/01/13 0500  NA 144 143 140  K 4.7 4.9 4.9  CL 112 109 101  CO2 21 21 27   GLUCOSE 253* 166* 224*  BUN 37* 39* 60*  CREATININE 1.69* 1.52* 1.78*  CALCIUM 7.8* 8.1* 8.4  MG  --   --  2.4  PHOS  --   --  4.6   Estimated Creatinine Clearance: 37.9 ml/min (by C-G formula based on Cr of 1.78).  ENDOCRINE CBG (last 3)   Recent Labs  09/30/13 1600 09/30/13 2208 10/01/13 0720  GLUCAP 184* 194* 209*   IMAGING x48h Dg Chest Port 1 View  10/01/2013   CLINICAL DATA:  Follow-up pneumonia superimposed upon interstitial fibrosis.  EXAM: PORTABLE CHEST - 1 VIEW  COMPARISON:  Portable chest x-rays 09/29/2013 dating back to 09/24/2013. CT chest 09/23/2013  FINDINGS: Cardiac silhouette enlarged but stable. Airspace consolidation in the lung bases superimposed upon chronic interstitial fibrosis with low lung volumes, unchanged dating back to 09/27/2013. No new pulmonary parenchymal abnormalities.  IMPRESSION: Stable pneumonia in the lung bases superimposed upon chronic interstitial fibrosis. No new abnormalities.   Electronically Signed   By: 11/27/2013 M.D.   On: 10/01/2013 07:55   ASSESSMENT / PLAN:  Acute on Chronic  respiratory failure in the setting of CAP +/- UIP flare H/o GOLD D COPD (predominately by CAT scoring as FEV1 66%)  ILD felt to be related to RA. Has not been on cellcept since March 2015 d/t rising scr and recurrent office sick visits. Suspect that this is most likely infection but ILD flare on ddx as well.   Plan -Cont supplemental oxygen and wean as able (baseline 4L), satn 85% acceptable -Cont systemic steroids 40mg  IV q6  -Continue empiric abx given worsening RLL infiltrate (Zosyn, Levaquin), but if makeS clinical progress will narrow -Intermittent CXR -Scheduled BDs ( brovana/budesonide and  spiriva) -Flutter and IS for pulm hygiene    CRI (BL scr 1.7 to 1.9)  At baseline Plan -HOLD  lasix  With rising BUN -monitor chem closely   H/o CAD and HTN - MI ruled out Plan -Continue home meds (cozaar, metoprolol, simvastatin)  H/o GERD Plan -PPI  -Heart healthy, carb modified diet  Hyperglycemia (consistently greater than 180 last 48 hours even with SSI coverage) Plan -Continue CBG checks and SSI -increase lantus to 15 units qhs 9/10 -- decrease as steroids taper   GLOBAL Continue gentle diuresis, systemic steroids, abx.  Overall seems to be making  marginal improvements.    Discussed advance directives with him, he will consider DNI status  , NP 10/01/2013  9:18 AM Pager: [phone] or [phone]  *Care during the described time interval was provided by me and/or other providers on the critical care team. I have reviewed this patient's available data, including medical history, events of note, physical examination and test results as part of my evaluation.  [phone] MD

## 2013-10-01 NOTE — Progress Notes (Signed)
UR Completed Gara Kincade Graves-Bigelow, RN,BSN [phone]  

## 2013-10-02 ENCOUNTER — Other Ambulatory Visit (HOSPITAL_COMMUNITY): Payer: Self-pay

## 2013-10-02 ENCOUNTER — Institutional Professional Consult (permissible substitution)
Admission: AD | Admit: 2013-10-02 | Discharge: 2013-10-22 | Disposition: A | Discharge status: Skilled Nursing Facility | Payer: Self-pay | Source: Ambulatory Visit | Attending: Internal Medicine | Admitting: Internal Medicine

## 2013-10-02 LAB — BLOOD GAS, ARTERIAL
Acid-Base Excess: 3.8 mmol/L — ABNORMAL HIGH (ref 0.0–2.0)
BICARBONATE: 27.8 meq/L — AB (ref 20.0–24.0)
FIO2: 0.5 %
O2 Saturation: 92.8 %
PCO2 ART: 42.1 mmHg (ref 35.0–45.0)
Patient temperature: 98.6
TCO2: 29.1 mmol/L (ref 0–100)
pH, Arterial: 7.436 (ref 7.350–7.450)
pO2, Arterial: 66.4 mmHg — ABNORMAL LOW (ref 80.0–100.0)

## 2013-10-02 LAB — GLUCOSE, CAPILLARY
GLUCOSE-CAPILLARY: 165 mg/dL — AB (ref 70–99)
GLUCOSE-CAPILLARY: 165 mg/dL — AB (ref 70–99)
Glucose-Capillary: 156 mg/dL — ABNORMAL HIGH (ref 70–99)

## 2013-10-02 LAB — BASIC METABOLIC PANEL
Anion gap: 13 (ref 5–15)
BUN: 69 mg/dL — AB (ref 6–23)
CHLORIDE: 102 meq/L (ref 96–112)
CO2: 29 mEq/L (ref 19–32)
Calcium: 8.3 mg/dL — ABNORMAL LOW (ref 8.4–10.5)
Creatinine, Ser: 1.99 mg/dL — ABNORMAL HIGH (ref 0.50–1.35)
GFR calc non Af Amer: 30 mL/min — ABNORMAL LOW (ref >90)
GFR, EST AFRICAN AMERICAN: 35 mL/min — AB (ref >90)
GLUCOSE: 209 mg/dL — AB (ref 70–99)
POTASSIUM: 5 meq/L (ref 3.7–5.3)
SODIUM: 144 meq/L (ref 137–147)

## 2013-10-02 MED ORDER — ALBUTEROL SULFATE (2.5 MG/3ML) 0.083% IN NEBU: 2.5000 mg | INHALATION_SOLUTION | RESPIRATORY_TRACT | Status: DC | PRN

## 2013-10-02 MED ORDER — ENSURE COMPLETE PO LIQD: 237.0000 mL | Freq: Three times a day (TID) | ORAL | Status: DC

## 2013-10-02 MED ORDER — POLYETHYLENE GLYCOL 3350 17 G PO PACK: 17.0000 g | PACK | Freq: Every day | ORAL | Status: DC

## 2013-10-02 MED ORDER — INSULIN ASPART 100 UNIT/ML ~~LOC~~ SOLN: 0.0000 [IU] | Freq: Three times a day (TID) | SUBCUTANEOUS | Status: DC

## 2013-10-02 MED ORDER — BUDESONIDE 0.25 MG/2ML IN SUSP: 0.2500 mg | Freq: Two times a day (BID) | RESPIRATORY_TRACT | Status: DC

## 2013-10-02 MED ORDER — HEPARIN SODIUM (PORCINE) 5000 UNIT/ML IJ SOLN: 5000.0000 [IU] | Freq: Three times a day (TID) | INTRAMUSCULAR | Status: DC

## 2013-10-02 MED ORDER — PANTOPRAZOLE SODIUM 40 MG PO TBEC: 40.0000 mg | DELAYED_RELEASE_TABLET | Freq: Every day | ORAL | Status: DC

## 2013-10-02 MED ORDER — METHYLPREDNISOLONE SODIUM SUCC 40 MG IJ SOLR: 40.0000 mg | Freq: Four times a day (QID) | INTRAMUSCULAR | Status: DC

## 2013-10-02 MED ORDER — METHYLPREDNISOLONE SODIUM SUCC 125 MG IJ SOLR
80.0000 mg | Freq: Two times a day (BID) | INTRAMUSCULAR | Status: DC
  Filled 2013-10-02 (×2): qty 1.28

## 2013-10-02 MED ORDER — LEVOFLOXACIN IN D5W 750 MG/150ML IV SOLN: 750.0000 mg | INTRAVENOUS | Status: DC

## 2013-10-02 MED ORDER — PIPERACILLIN-TAZOBACTAM 3.375 G IVPB: 3.3750 g | Freq: Three times a day (TID) | INTRAVENOUS | Status: DC

## 2013-10-02 MED ORDER — METOPROLOL SUCCINATE ER 25 MG PO TB24: 25.0000 mg | ORAL_TABLET | Freq: Every day | ORAL | Status: DC

## 2013-10-02 MED ORDER — INSULIN GLARGINE 100 UNIT/ML ~~LOC~~ SOLN: 15.0000 [IU] | Freq: Every day | SUBCUTANEOUS | Status: DC

## 2013-10-02 MED ORDER — ENSURE PUDDING PO PUDG: 1.0000 | Freq: Two times a day (BID) | ORAL | Status: DC | PRN

## 2013-10-02 MED ORDER — BISACODYL 10 MG RE SUPP: 10.0000 mg | Freq: Every day | RECTAL | Status: DC | PRN

## 2013-10-02 MED ORDER — SENNOSIDES-DOCUSATE SODIUM 8.6-50 MG PO TABS: 2.0000 | ORAL_TABLET | Freq: Two times a day (BID) | ORAL | Status: DC

## 2013-10-02 MED ORDER — METHYLPREDNISOLONE SODIUM SUCC 125 MG IJ SOLR: 80.0000 mg | Freq: Two times a day (BID) | INTRAMUSCULAR | Status: DC

## 2013-10-02 MED ORDER — ARFORMOTEROL TARTRATE 15 MCG/2ML IN NEBU: 15.0000 ug | INHALATION_SOLUTION | Freq: Two times a day (BID) | RESPIRATORY_TRACT | Status: DC

## 2013-10-02 MED ORDER — CETYLPYRIDINIUM CHLORIDE 0.05 % MT LIQD: 7.0000 mL | Freq: Two times a day (BID) | OROMUCOSAL | Status: DC

## 2013-10-02 NOTE — Discharge Summary (Signed)
Physician Discharge Summary  Patient ID: Evan [name] MRN: [id number] DOB/AGE: 1931/07/22 80 y.o.  Admit date: 09/23/2013 Discharge date: 10/02/2013    Discharge Diagnoses:  Acute on Chronic Respiratory Failure  CAP  UIP Flare GOLD D COPD DRI CAD HTN GERD Hyperglycemia  Rheumatoid Arthritis                                                                        DISCHARGE PLAN BY DIAGNOSIS     Acute on Chronic respiratory failure in the setting of CAP +/- UIP flare  H/o GOLD D COPD (predominately by CAT scoring as FEV1 66%)   ILD felt to be related to RA. He has not been on cellcept since March 2015 due to rising scr and recurrent office sick visits. Suspect that this is most likely infection but ILD flare certainly has to be entertained as well.   Discharge Plan: -Continue supplemental oxygen and wean as able (baseline 4L), saturations of 85% acceptable  -Continue systemic steroids 80 mg IV q12, will require very slow taper -Continue Levaquin for 10 days total antibiotics, start date 9/2  -Intermittent CXR  -Scheduled BDs (brovana/budesonide and spiriva)  -Flutter and IS for pulm hygiene  -PT efforts, mobilize  CRI (BL scr 1.7 to 1.9) - At baseline   Discharge Plan: -HOLD lasix With rising BUN  -monitor BMP closely  H/o CAD and HTN - MI ruled out   Discharge Plan: -Continue home meds:  cozaar, metoprolol, simvastatin  H/o GERD  Protein Calorie Malnutrition  Discharge Plan: -PPI  -Heart healthy, carb modified diet  -Continue Ensure PO TID + Ensure Pudding BID with meds PRN  Hyperglycemia    Discharge Plan: -Continue CBG checks and SSI  -Lantus 15 units qhs 9/10 -- decrease as steroids taper   GLOBAL  Continue systemic steroids, abx. Overall seems to be making marginal improvements.  Discussed advance directives with him, he will consider DNI status                     DISCHARGE SUMMARY   Evan [name] is a 80 y.o. y/o male with a PMH of  CAD, RA, Chronic O2 dependent (4L pulsed) Respiratory Failure in the setting of suspected UIP/ILD felt related to RA who was seen in the pulmonary office with an approximate 5-7 day h/o cough productive of clear to yellow colored sputum, w/ associated worsening dyspnea. At baseline he can ambulate the distance of [place] and [place] on presentation to the office, he is SOB at ~ 85f. On 4 liters pulsed O2 his saturations in the office were 71%, these raised to 80s on 4L cont O2. [facility].  High resolution CT of the Chest was obtained which was notable for progression of fibrotic pattern from prior exam 08/08/11.  He initially required  100% NRB to maintain sats 88-92. He was treated for CAP vs ILD flare. MI was ruled out.  Procalcitonin was trended and negative.  2D ECHO was consistent with normal systolic function, EF 537-10% moderate increase in PA pressures.  He slowly improved and was transferred out to telemetry. Unfortunately, he had a worsening of SOB on 9/6  and was transferred back to SDU.  Pan cultures were negative as well as respiratory viral panel.  He was treated with IV zosyn & levaquin, steroids and oxygen titrated to maintain saturations > 85%.  Patients oxygen needs temporized and antibiotics were narrowed. Given advanced pulmonary disease, discussions were held regarding DNI status as he would not likely come off mechanical ventilation. He stabilized and was deemed medically able to discharge to [facility] on 9/11 for further rehab efforts. Palliative discussions can be continued depending on his progress             SIGNIFICANT DIAGNOSTIC STUDIES 9/02  CT Chest High Res >> parenchymal pattern of fibrosis with slight progression from prior exam 08/08/11 c/w UIP, three vessel CAD, enlarged pulmonary arteries  9/05 2D ECHO >> EF 55-60%, no wma, RA moderately dilated, PA pressures moderately increased  MICRO DATA  Sputum  9/2 > Neg  Blood 9/2 > Neg  U. strep antigen 9/2 > Neg  U. legionella antigen 9/2 >Neg  resp viral panel 9/2 > Neg  ANTIBIOTICS Zosyn 9/2>>>9/11 levaquin 9/2>>>   Discharge Exam: General: elderly male in NAD  Neuro: Alert, oriented, no focal deficit, agitated, wants me to leave so he can eat  HEENT: [place], no JVD, MMM  Cardiovascular: Borderline tachy, regular rhythm  Lungs: resps even non labored on 6L [place], sats 86%, scattered rales  Abdomen: soft, non-tender  Musculoskeletal: Warm and dry, 2+ BLE edema    Filed Vitals:   10/02/13 0255 10/02/13 0808 10/02/13 1023 10/02/13 1100  BP: 132/76     Pulse: 88     Temp: 96.9 F (36.1 C) 96.3 F (35.7 C)  97.3 F (36.3 C)  TempSrc: Axillary Axillary  Oral  Resp: 15     Height:      Weight:      SpO2: 92%  92%      Discharge Labs  BMET  Recent Labs Lab 09/26/13 0123 09/28/13 0338 10/01/13 0500 10/02/13 0300  NA 144 143 140 144  K 4.7 4.9 4.9 5.0  CL 112 109 101 102  CO2 _0 GLUCOSE 253* 166* 224* 209*  BUN 37* 39* 60* 69*  CREATININE 1.69* 1.52* 1.78* 1.99*  CALCIUM 7.8* 8.1* 8.4 8.3*  MG  --   --  2.4  --   PHOS  --   --  4.6  --     CBC  Recent Labs Lab 09/26/13 0123 09/28/13 0338 10/01/13 0500  HGB 9.8* 10.9* 11.5*  HCT 30.4* 34.8* 35.2*  WBC 13.8* 14.6* 16.5*  PLT 233 261 255    Anti-Coagulation No results found for this basename: INR,  in the last 168 hours  Discharge Instructions   Call MD for:  difficulty breathing, headache or visual disturbances    Complete by:  As directed      Call MD for:  hives    Complete by:  As directed      Call MD for:  persistant dizziness or light-headedness    Complete by:  As directed      Call MD for:  persistant nausea and vomiting    Complete by:  As directed      Call MD for:  severe uncontrolled pain    Complete by:  As directed      Call MD for:  temperature >100.4    Complete by:  As directed      Discharge instructions    Complete by:  As directed   Leave all IV's, tubes in place for transfer to [facility].     Increase activity slowly    Complete by:  As directed                 Follow-up Information   Follow up with Kandice Hams, MD. (Post discharge from [facility])    Specialty:  Internal Medicine   Contact information:   [address] [place] [address] [phone]          Medication List    STOP taking these medications       beclomethasone 80 MCG/ACT inhaler  Commonly known as:  QVAR     esomeprazole 40 MG capsule  Commonly known as:  NEXIUM      TAKE these medications       albuterol (2.5 MG/3ML) 0.083% nebulizer solution  Commonly known as:  PROVENTIL  Take 3 mLs (2.5 mg total) by nebulization every 2 (two) hours as needed for wheezing or shortness of breath.     antiseptic oral rinse 0.05 % Liqd solution  Commonly known as:  CPC / CETYLPYRIDINIUM CHLORIDE 0.05%  7 mLs by Mouth Rinse route 2 (two) times daily.     arformoterol 15 MCG/2ML Nebu  Commonly known as:  BROVANA  Take 2 mLs (15 mcg total) by nebulization every 12 (twelve) hours.     aspirin 81 MG tablet  Take 81 mg by mouth daily.     bisacodyl 10 MG suppository  Commonly known as:  DULCOLAX  Place 1 suppository (10 mg total) rectally daily as needed for moderate constipation.     budesonide 0.25 MG/2ML nebulizer solution  Commonly known as:  PULMICORT  Take 2 mLs (0.25 mg total) by nebulization every 12 (twelve) hours.     CALCIUM 600 1500 MG Tabs  Generic drug:  Calcium Carbonate  Take 1 tablet by mouth daily.     feeding supplement (ENSURE COMPLETE) Liqd  Take 237 mLs by mouth 3 (three) times daily between meals.     feeding supplement (ENSURE) Pudg  Take 1 Container by mouth 2 (two) times daily as needed (give with medications).     heparin 5000 UNIT/ML injection  Inject 1 mL (5,000 Units total) into the skin every 8 (eight) hours.     insulin aspart 100 UNIT/ML injection  Commonly known  as:  novoLOG  - Inject 0-15 Units into the skin 3 (three) times daily with meals. Correction coverage: Moderate (average weight, post-op)  - CBG < 70: implement hypoglycemia protocol  - CBG 70 - 120: 0 units  - CBG 121 - 150: 2 units  - CBG 151 - 200: 3 units  - CBG 201 - 250: 5 units  - CBG 251 - 300: 8 units  - CBG 301 - 350: 11 units  - CBG 351 - 400: 15 units  - CBG > 400: call MD and obtain STAT lab verification     insulin glargine 100 UNIT/ML injection  Commonly known as:  LANTUS  Inject 0.15 mLs (15 Units total) into the skin at bedtime.     levofloxacin 750 MG/150ML Soln  Commonly known as:  LEVAQUIN  Inject 150 mLs (750 mg total) into the vein every other day.     losartan 100 MG tablet  Commonly known as:  COZAAR  Take 1 tablet by mouth daily.     methylPREDNISolone sodium succinate 125 mg/2 mL injection  Commonly known as:  SOLU-MEDROL  Inject 1.28 mLs (80 mg total) into the vein every 12 (twelve) hours.     metoprolol succinate 25 MG 24 hr tablet  Commonly known as:  TOPROL-XL  Take 1 tablet (25 mg total) by mouth daily with breakfast.     mirtazapine 30 MG tablet  Commonly known as:  REMERON  Take 1 tablet by mouth at bedtime.     multivitamin tablet  Take 1 tablet by mouth daily.     pantoprazole 40 MG tablet  Commonly known as:  PROTONIX  Take 1 tablet (40 mg total) by mouth daily at 12 noon.     polyethylene glycol packet  Commonly known as:  MIRALAX / GLYCOLAX  Take 17 g by mouth daily.     senna-docusate 8.6-50 MG per tablet  Commonly known as:  Senokot-S  Take 2 tablets by mouth 2 (two) times daily.     simvastatin 40 MG tablet  Commonly known as:  ZOCOR  Take 40 mg by mouth at bedtime.     temazepam 15 MG capsule  Commonly known as:  RESTORIL  15 mg. Take one tablet daily at bedtime for sleep     tiotropium 18 MCG inhalation capsule  Commonly known as:  SPIRIVA  Place 18 mcg into inhaler and inhale daily.          Disposition:   [place]  Discharged Condition: Venson Ferencz Bachicha has met maximum benefit of inpatient care and is medically stable and cleared for discharge.  Patient is pending follow up as above.      Time spent on disposition:  Greater than 35 minutes.   Signed: Noe Gens, NP-C  Pulmonary & Critical Care Pgr: [phone] Office: [phone]   Independently examined pt, evaluated data & formulated above discharge  care plan with NP who scribed this note & edited by me.  ALVA,RAKESH V. mD

## 2013-10-02 NOTE — Progress Notes (Signed)
Report called and given  to receiving nurse @ [facility]).

## 2013-10-02 NOTE — Progress Notes (Signed)
D/w pt - he is agreeable to LTAC as we monitor for improvement Change solumedrol to 80 q 12h Stop zosyn - complete 10 ds of levaquin He is considering advance directives - can involve palliative care if worse. His O2 needs seem to have temporized.  Oretha Milch MD

## 2013-10-03 LAB — COMPREHENSIVE METABOLIC PANEL
ALBUMIN: 3.1 g/dL — AB (ref 3.5–5.2)
ALT: 44 U/L (ref 0–53)
AST: 31 U/L (ref 0–37)
Alkaline Phosphatase: 76 U/L (ref 39–117)
Anion gap: 13 (ref 5–15)
BILIRUBIN TOTAL: 0.6 mg/dL (ref 0.3–1.2)
BUN: 65 mg/dL — ABNORMAL HIGH (ref 6–23)
CHLORIDE: 103 meq/L (ref 96–112)
CO2: 26 meq/L (ref 19–32)
Calcium: 8.8 mg/dL (ref 8.4–10.5)
Creatinine, Ser: 1.68 mg/dL — ABNORMAL HIGH (ref 0.50–1.35)
GFR calc Af Amer: 43 mL/min — ABNORMAL LOW (ref >90)
GFR, EST NON AFRICAN AMERICAN: 37 mL/min — AB (ref >90)
Glucose, Bld: 150 mg/dL — ABNORMAL HIGH (ref 70–99)
POTASSIUM: 5.2 meq/L (ref 3.7–5.3)
Sodium: 142 mEq/L (ref 137–147)
Total Protein: 6.4 g/dL (ref 6.0–8.3)

## 2013-10-03 LAB — BASIC METABOLIC PANEL
Anion gap: 13 (ref 5–15)
BUN: 63 mg/dL — AB (ref 6–23)
CHLORIDE: 102 meq/L (ref 96–112)
CO2: 25 mEq/L (ref 19–32)
Calcium: 8.5 mg/dL (ref 8.4–10.5)
Creatinine, Ser: 1.59 mg/dL — ABNORMAL HIGH (ref 0.50–1.35)
GFR calc Af Amer: 46 mL/min — ABNORMAL LOW (ref >90)
GFR calc non Af Amer: 40 mL/min — ABNORMAL LOW (ref >90)
GLUCOSE: 152 mg/dL — AB (ref 70–99)
POTASSIUM: 5.3 meq/L (ref 3.7–5.3)
Sodium: 140 mEq/L (ref 137–147)

## 2013-10-03 LAB — PROTIME-INR
INR: 1.21 (ref 0.00–1.49)
Prothrombin Time: 15.3 seconds — ABNORMAL HIGH (ref 11.6–15.2)

## 2013-10-03 LAB — HEMOGLOBIN A1C
Hgb A1c MFr Bld: 6.7 % — ABNORMAL HIGH (ref <5.7)
Mean Plasma Glucose: 146 mg/dL — ABNORMAL HIGH (ref <117)

## 2013-10-03 LAB — URINALYSIS, ROUTINE W REFLEX MICROSCOPIC
BILIRUBIN URINE: NEGATIVE
Glucose, UA: NEGATIVE mg/dL
Hgb urine dipstick: NEGATIVE
Ketones, ur: NEGATIVE mg/dL
LEUKOCYTES UA: NEGATIVE
NITRITE: NEGATIVE
PH: 7 (ref 5.0–8.0)
Protein, ur: NEGATIVE mg/dL
SPECIFIC GRAVITY, URINE: 1.022 (ref 1.005–1.030)
Urobilinogen, UA: 1 mg/dL (ref 0.0–1.0)

## 2013-10-03 LAB — CBC WITH DIFFERENTIAL/PLATELET
BASOS ABS: 0 10*3/uL (ref 0.0–0.1)
BASOS PCT: 0 % (ref 0–1)
Basophils Absolute: 0 10*3/uL (ref 0.0–0.1)
Basophils Relative: 0 % (ref 0–1)
Eosinophils Absolute: 0 10*3/uL (ref 0.0–0.7)
Eosinophils Absolute: 0 10*3/uL (ref 0.0–0.7)
Eosinophils Relative: 0 % (ref 0–5)
Eosinophils Relative: 0 % (ref 0–5)
HCT: 40.4 % (ref 39.0–52.0)
HEMATOCRIT: 39.3 % (ref 39.0–52.0)
Hemoglobin: 12.8 g/dL — ABNORMAL LOW (ref 13.0–17.0)
Hemoglobin: 12.6 g/dL — ABNORMAL LOW (ref 13.0–17.0)
LYMPHS PCT: 5 % — AB (ref 12–46)
Lymphocytes Relative: 4 % — ABNORMAL LOW (ref 12–46)
Lymphs Abs: 1 10*3/uL (ref 0.7–4.0)
Lymphs Abs: 0.8 10*3/uL (ref 0.7–4.0)
MCH: 30 pg (ref 26.0–34.0)
MCH: 30.4 pg (ref 26.0–34.0)
MCHC: 31.7 g/dL (ref 30.0–36.0)
MCHC: 32.1 g/dL (ref 30.0–36.0)
MCV: 94.8 fL (ref 78.0–100.0)
MCV: 94.7 fL (ref 78.0–100.0)
MONOS PCT: 4 % (ref 3–12)
Monocytes Absolute: 1.1 10*3/uL — ABNORMAL HIGH (ref 0.1–1.0)
Monocytes Absolute: 0.8 10*3/uL (ref 0.1–1.0)
Monocytes Relative: 6 % (ref 3–12)
NEUTROS ABS: 18 10*3/uL — AB (ref 1.7–7.7)
NEUTROS PCT: 89 % — AB (ref 43–77)
NEUTROS PCT: 92 % — AB (ref 43–77)
Neutro Abs: 18.5 10*3/uL — ABNORMAL HIGH (ref 1.7–7.7)
Platelets: 295 10*3/uL (ref 150–400)
Platelets: 275 10*3/uL (ref 150–400)
RBC: 4.26 MIL/uL (ref 4.22–5.81)
RBC: 4.15 MIL/uL — AB (ref 4.22–5.81)
RDW: 14.8 % (ref 11.5–15.5)
RDW: 14.8 % (ref 11.5–15.5)
WBC: 20.6 10*3/uL — AB (ref 4.0–10.5)
WBC: 19.6 10*3/uL — ABNORMAL HIGH (ref 4.0–10.5)

## 2013-10-03 LAB — MAGNESIUM
Magnesium: 2.5 mg/dL (ref 1.5–2.5)
Magnesium: 2.5 mg/dL (ref 1.5–2.5)

## 2013-10-03 LAB — PHOSPHORUS
Phosphorus: 4.8 mg/dL — ABNORMAL HIGH (ref 2.3–4.6)
Phosphorus: 4.4 mg/dL (ref 2.3–4.6)

## 2013-10-03 LAB — PROCALCITONIN

## 2013-10-03 LAB — PREALBUMIN: PREALBUMIN: 35.9 mg/dL — AB (ref 17.0–34.0)

## 2013-10-03 LAB — VITAMIN B12: VITAMIN B 12: 1068 pg/mL — AB (ref 211–911)

## 2013-10-03 LAB — T4, FREE: Free T4: 1.05 ng/dL (ref 0.80–1.80)

## 2013-10-03 LAB — FERRITIN: Ferritin: 320 ng/mL (ref 22–322)

## 2013-10-03 LAB — TSH: TSH: 3.03 u[IU]/mL (ref 0.350–4.500)

## 2013-10-04 LAB — URINE CULTURE
Colony Count: 1000
Special Requests: NORMAL

## 2013-10-05 ENCOUNTER — Other Ambulatory Visit (HOSPITAL_COMMUNITY): Payer: Self-pay

## 2013-10-05 LAB — CBC WITH DIFFERENTIAL/PLATELET
BASOS ABS: 0 10*3/uL (ref 0.0–0.1)
Basophils Relative: 0 % (ref 0–1)
Eosinophils Absolute: 0 10*3/uL (ref 0.0–0.7)
Eosinophils Relative: 0 % (ref 0–5)
HEMATOCRIT: 39.6 % (ref 39.0–52.0)
HEMOGLOBIN: 12.4 g/dL — AB (ref 13.0–17.0)
Lymphocytes Relative: 5 % — ABNORMAL LOW (ref 12–46)
Lymphs Abs: 1.2 10*3/uL (ref 0.7–4.0)
MCH: 30.2 pg (ref 26.0–34.0)
MCHC: 31.3 g/dL (ref 30.0–36.0)
MCV: 96.4 fL (ref 78.0–100.0)
MONO ABS: 0.9 10*3/uL (ref 0.1–1.0)
MONOS PCT: 4 % (ref 3–12)
NEUTROS ABS: 21.7 10*3/uL — AB (ref 1.7–7.7)
Neutrophils Relative %: 91 % — ABNORMAL HIGH (ref 43–77)
Platelets: 241 10*3/uL (ref 150–400)
RBC: 4.11 MIL/uL — ABNORMAL LOW (ref 4.22–5.81)
RDW: 14.8 % (ref 11.5–15.5)
WBC: 23.9 10*3/uL — AB (ref 4.0–10.5)

## 2013-10-05 LAB — BASIC METABOLIC PANEL
ANION GAP: 11 (ref 5–15)
Anion gap: 11 (ref 5–15)
BUN: 59 mg/dL — AB (ref 6–23)
BUN: 60 mg/dL — ABNORMAL HIGH (ref 6–23)
CALCIUM: 8.1 mg/dL — AB (ref 8.4–10.5)
CHLORIDE: 104 meq/L (ref 96–112)
CHLORIDE: 106 meq/L (ref 96–112)
CO2: 27 mEq/L (ref 19–32)
CO2: 25 mEq/L (ref 19–32)
CREATININE: 1.46 mg/dL — AB (ref 0.50–1.35)
CREATININE: 1.32 mg/dL (ref 0.50–1.35)
Calcium: 9.1 mg/dL (ref 8.4–10.5)
GFR calc Af Amer: 51 mL/min — ABNORMAL LOW (ref >90)
GFR calc non Af Amer: 44 mL/min — ABNORMAL LOW (ref >90)
GFR, EST AFRICAN AMERICAN: 58 mL/min — AB (ref >90)
GFR, EST NON AFRICAN AMERICAN: 50 mL/min — AB (ref >90)
Glucose, Bld: 231 mg/dL — ABNORMAL HIGH (ref 70–99)
Glucose, Bld: 144 mg/dL — ABNORMAL HIGH (ref 70–99)
Potassium: 6.2 mEq/L — ABNORMAL HIGH (ref 3.7–5.3)
Potassium: 5.9 mEq/L — ABNORMAL HIGH (ref 3.7–5.3)
Sodium: 142 mEq/L (ref 137–147)
Sodium: 142 mEq/L (ref 137–147)

## 2013-10-05 LAB — CK TOTAL AND CKMB (NOT AT ARMC)
CK, MB: 4 ng/mL (ref 0.3–4.0)
CK, MB: 4 ng/mL (ref 0.3–4.0)
RELATIVE INDEX: 2.5 (ref 0.0–2.5)
Relative Index: 3.7 — ABNORMAL HIGH (ref 0.0–2.5)
Total CK: 158 U/L (ref 7–232)
Total CK: 108 U/L (ref 7–232)

## 2013-10-05 LAB — POTASSIUM: POTASSIUM: 6.5 meq/L — AB (ref 3.7–5.3)

## 2013-10-05 LAB — TROPONIN I: Troponin I: <0.3 ng/mL (ref <0.30)

## 2013-10-05 LAB — FOLATE RBC: RBC FOLATE: 1155 ng/mL — AB (ref >280)

## 2013-10-05 LAB — PHOSPHORUS: Phosphorus: 3.4 mg/dL (ref 2.3–4.6)

## 2013-10-05 LAB — MAGNESIUM: Magnesium: 2.3 mg/dL (ref 1.5–2.5)

## 2013-10-05 LAB — PREALBUMIN: PREALBUMIN: 37.6 mg/dL — AB (ref 17.0–34.0)

## 2013-10-06 LAB — CBC WITH DIFFERENTIAL/PLATELET
BASOS ABS: 0 10*3/uL (ref 0.0–0.1)
Basophils Relative: 0 % (ref 0–1)
EOS ABS: 0 10*3/uL (ref 0.0–0.7)
EOS PCT: 0 % (ref 0–5)
HCT: 35.4 % — ABNORMAL LOW (ref 39.0–52.0)
Hemoglobin: 11.2 g/dL — ABNORMAL LOW (ref 13.0–17.0)
Lymphocytes Relative: 3 % — ABNORMAL LOW (ref 12–46)
Lymphs Abs: 0.7 10*3/uL (ref 0.7–4.0)
MCH: 30.4 pg (ref 26.0–34.0)
MCHC: 31.6 g/dL (ref 30.0–36.0)
MCV: 96.2 fL (ref 78.0–100.0)
Monocytes Absolute: 0.7 10*3/uL (ref 0.1–1.0)
Monocytes Relative: 3 % (ref 3–12)
NEUTROS PCT: 94 % — AB (ref 43–77)
Neutro Abs: 19.9 10*3/uL — ABNORMAL HIGH (ref 1.7–7.7)
PLATELETS: 208 10*3/uL (ref 150–400)
RBC: 3.68 MIL/uL — ABNORMAL LOW (ref 4.22–5.81)
RDW: 15.1 % (ref 11.5–15.5)
WBC: 21.4 10*3/uL — ABNORMAL HIGH (ref 4.0–10.5)

## 2013-10-06 LAB — BASIC METABOLIC PANEL
ANION GAP: 9 (ref 5–15)
BUN: 54 mg/dL — ABNORMAL HIGH (ref 6–23)
CO2: 29 mEq/L (ref 19–32)
Calcium: 8.5 mg/dL (ref 8.4–10.5)
Chloride: 108 mEq/L (ref 96–112)
Creatinine, Ser: 1.33 mg/dL (ref 0.50–1.35)
GFR calc Af Amer: 57 mL/min — ABNORMAL LOW (ref >90)
GFR, EST NON AFRICAN AMERICAN: 49 mL/min — AB (ref >90)
Glucose, Bld: 131 mg/dL — ABNORMAL HIGH (ref 70–99)
Potassium: 5.4 mEq/L — ABNORMAL HIGH (ref 3.7–5.3)
SODIUM: 146 meq/L (ref 137–147)

## 2013-10-06 LAB — EXPECTORATED SPUTUM ASSESSMENT W GRAM STAIN, RFLX TO RESP C

## 2013-10-06 LAB — MAGNESIUM: MAGNESIUM: 2.3 mg/dL (ref 1.5–2.5)

## 2013-10-06 LAB — EXPECTORATED SPUTUM ASSESSMENT W REFEX TO RESP CULTURE

## 2013-10-06 LAB — PHOSPHORUS: Phosphorus: 4.6 mg/dL (ref 2.3–4.6)

## 2013-10-07 ENCOUNTER — Other Ambulatory Visit (HOSPITAL_COMMUNITY): Payer: Self-pay

## 2013-10-07 LAB — CBC WITH DIFFERENTIAL/PLATELET
BASOS PCT: 0 % (ref 0–1)
Basophils Absolute: 0 10*3/uL (ref 0.0–0.1)
EOS ABS: 0 10*3/uL (ref 0.0–0.7)
Eosinophils Relative: 0 % (ref 0–5)
HCT: 32.7 % — ABNORMAL LOW (ref 39.0–52.0)
Hemoglobin: 10.3 g/dL — ABNORMAL LOW (ref 13.0–17.0)
LYMPHS ABS: 0.6 10*3/uL — AB (ref 0.7–4.0)
Lymphocytes Relative: 3 % — ABNORMAL LOW (ref 12–46)
MCH: 30.4 pg (ref 26.0–34.0)
MCHC: 31.5 g/dL (ref 30.0–36.0)
MCV: 96.5 fL (ref 78.0–100.0)
Monocytes Absolute: 0.4 10*3/uL (ref 0.1–1.0)
Monocytes Relative: 2 % — ABNORMAL LOW (ref 3–12)
NEUTROS PCT: 95 % — AB (ref 43–77)
Neutro Abs: 16.7 10*3/uL — ABNORMAL HIGH (ref 1.7–7.7)
Platelets: 172 10*3/uL (ref 150–400)
RBC: 3.39 MIL/uL — AB (ref 4.22–5.81)
RDW: 15 % (ref 11.5–15.5)
WBC: 17.7 10*3/uL — ABNORMAL HIGH (ref 4.0–10.5)

## 2013-10-07 LAB — BASIC METABOLIC PANEL
Anion gap: 12 (ref 5–15)
BUN: 50 mg/dL — ABNORMAL HIGH (ref 6–23)
CO2: 27 mEq/L (ref 19–32)
Calcium: 8.1 mg/dL — ABNORMAL LOW (ref 8.4–10.5)
Chloride: 105 mEq/L (ref 96–112)
Creatinine, Ser: 1.3 mg/dL (ref 0.50–1.35)
GFR calc Af Amer: 59 mL/min — ABNORMAL LOW (ref >90)
GFR, EST NON AFRICAN AMERICAN: 51 mL/min — AB (ref >90)
Glucose, Bld: 198 mg/dL — ABNORMAL HIGH (ref 70–99)
POTASSIUM: 4.9 meq/L (ref 3.7–5.3)
SODIUM: 144 meq/L (ref 137–147)

## 2013-10-07 LAB — PHOSPHORUS: PHOSPHORUS: 4.3 mg/dL (ref 2.3–4.6)

## 2013-10-07 LAB — MAGNESIUM: MAGNESIUM: 2.1 mg/dL (ref 1.5–2.5)

## 2013-10-08 ENCOUNTER — Other Ambulatory Visit (HOSPITAL_COMMUNITY): Payer: Self-pay

## 2013-10-08 LAB — CBC WITH DIFFERENTIAL/PLATELET
BASOS ABS: 0 10*3/uL (ref 0.0–0.1)
Basophils Relative: 0 % (ref 0–1)
Eosinophils Absolute: 0 10*3/uL (ref 0.0–0.7)
Eosinophils Relative: 0 % (ref 0–5)
HCT: 33.5 % — ABNORMAL LOW (ref 39.0–52.0)
Hemoglobin: 10.7 g/dL — ABNORMAL LOW (ref 13.0–17.0)
Lymphocytes Relative: 4 % — ABNORMAL LOW (ref 12–46)
Lymphs Abs: 0.6 10*3/uL — ABNORMAL LOW (ref 0.7–4.0)
MCH: 30.5 pg (ref 26.0–34.0)
MCHC: 31.9 g/dL (ref 30.0–36.0)
MCV: 95.4 fL (ref 78.0–100.0)
MONO ABS: 0.8 10*3/uL (ref 0.1–1.0)
Monocytes Relative: 5 % (ref 3–12)
NEUTROS ABS: 14.3 10*3/uL — AB (ref 1.7–7.7)
Neutrophils Relative %: 91 % — ABNORMAL HIGH (ref 43–77)
Platelets: 167 10*3/uL (ref 150–400)
RBC: 3.51 MIL/uL — ABNORMAL LOW (ref 4.22–5.81)
RDW: 15 % (ref 11.5–15.5)
WBC: 15.8 10*3/uL — ABNORMAL HIGH (ref 4.0–10.5)

## 2013-10-08 LAB — BASIC METABOLIC PANEL
ANION GAP: 9 (ref 5–15)
BUN: 43 mg/dL — ABNORMAL HIGH (ref 6–23)
CHLORIDE: 107 meq/L (ref 96–112)
CO2: 29 mEq/L (ref 19–32)
CREATININE: 1.11 mg/dL (ref 0.50–1.35)
Calcium: 8.5 mg/dL (ref 8.4–10.5)
GFR, EST AFRICAN AMERICAN: 71 mL/min — AB (ref >90)
GFR, EST NON AFRICAN AMERICAN: 61 mL/min — AB (ref >90)
Glucose, Bld: 138 mg/dL — ABNORMAL HIGH (ref 70–99)
Potassium: 5 mEq/L (ref 3.7–5.3)
Sodium: 145 mEq/L (ref 137–147)

## 2013-10-08 LAB — PHOSPHORUS: Phosphorus: 4.3 mg/dL (ref 2.3–4.6)

## 2013-10-08 LAB — MAGNESIUM: Magnesium: 2.1 mg/dL (ref 1.5–2.5)

## 2013-10-09 LAB — CULTURE, RESPIRATORY W GRAM STAIN: Culture: NORMAL

## 2013-10-11 ENCOUNTER — Other Ambulatory Visit (HOSPITAL_COMMUNITY): Payer: Self-pay

## 2013-10-11 LAB — CBC WITH DIFFERENTIAL/PLATELET
BASOS ABS: 0 10*3/uL (ref 0.0–0.1)
BASOS PCT: 0 % (ref 0–1)
EOS ABS: 0 10*3/uL (ref 0.0–0.7)
EOS PCT: 0 % (ref 0–5)
HCT: 32.4 % — ABNORMAL LOW (ref 39.0–52.0)
Hemoglobin: 10.5 g/dL — ABNORMAL LOW (ref 13.0–17.0)
LYMPHS PCT: 5 % — AB (ref 12–46)
Lymphs Abs: 0.6 10*3/uL — ABNORMAL LOW (ref 0.7–4.0)
MCH: 30.8 pg (ref 26.0–34.0)
MCHC: 32.4 g/dL (ref 30.0–36.0)
MCV: 95 fL (ref 78.0–100.0)
Monocytes Absolute: 0.4 10*3/uL (ref 0.1–1.0)
Monocytes Relative: 3 % (ref 3–12)
Neutro Abs: 11.4 10*3/uL — ABNORMAL HIGH (ref 1.7–7.7)
Neutrophils Relative %: 92 % — ABNORMAL HIGH (ref 43–77)
PLATELETS: 132 10*3/uL — AB (ref 150–400)
RBC: 3.41 MIL/uL — ABNORMAL LOW (ref 4.22–5.81)
RDW: 15.1 % (ref 11.5–15.5)
WBC: 12.5 10*3/uL — AB (ref 4.0–10.5)

## 2013-10-12 LAB — CULTURE, BLOOD (ROUTINE X 2)
CULTURE: NO GROWTH
Culture: NO GROWTH

## 2013-10-12 LAB — CBC WITH DIFFERENTIAL/PLATELET
Basophils Absolute: 0 10*3/uL (ref 0.0–0.1)
Basophils Relative: 0 % (ref 0–1)
Eosinophils Absolute: 0.1 10*3/uL (ref 0.0–0.7)
Eosinophils Relative: 1 % (ref 0–5)
HEMATOCRIT: 33.4 % — AB (ref 39.0–52.0)
Hemoglobin: 10.9 g/dL — ABNORMAL LOW (ref 13.0–17.0)
LYMPHS PCT: 9 % — AB (ref 12–46)
Lymphs Abs: 1.2 10*3/uL (ref 0.7–4.0)
MCH: 31 pg (ref 26.0–34.0)
MCHC: 32.6 g/dL (ref 30.0–36.0)
MCV: 94.9 fL (ref 78.0–100.0)
Monocytes Absolute: 0.7 10*3/uL (ref 0.1–1.0)
Monocytes Relative: 5 % (ref 3–12)
NEUTROS ABS: 10.8 10*3/uL — AB (ref 1.7–7.7)
NEUTROS PCT: 85 % — AB (ref 43–77)
PLATELETS: 127 10*3/uL — AB (ref 150–400)
RBC: 3.52 MIL/uL — ABNORMAL LOW (ref 4.22–5.81)
RDW: 15.2 % (ref 11.5–15.5)
WBC: 12.7 10*3/uL — AB (ref 4.0–10.5)

## 2013-10-12 LAB — COMPREHENSIVE METABOLIC PANEL
ALT: 33 U/L (ref 0–53)
AST: 18 U/L (ref 0–37)
Albumin: 2.7 g/dL — ABNORMAL LOW (ref 3.5–5.2)
Alkaline Phosphatase: 73 U/L (ref 39–117)
Anion gap: 9 (ref 5–15)
BILIRUBIN TOTAL: 0.4 mg/dL (ref 0.3–1.2)
BUN: 38 mg/dL — ABNORMAL HIGH (ref 6–23)
CHLORIDE: 101 meq/L (ref 96–112)
CO2: 28 meq/L (ref 19–32)
Calcium: 8.6 mg/dL (ref 8.4–10.5)
Creatinine, Ser: 1.18 mg/dL (ref 0.50–1.35)
GFR calc Af Amer: 65 mL/min — ABNORMAL LOW (ref >90)
GFR, EST NON AFRICAN AMERICAN: 56 mL/min — AB (ref >90)
Glucose, Bld: 125 mg/dL — ABNORMAL HIGH (ref 70–99)
POTASSIUM: 5.3 meq/L (ref 3.7–5.3)
SODIUM: 138 meq/L (ref 137–147)
Total Protein: 5.1 g/dL — ABNORMAL LOW (ref 6.0–8.3)

## 2013-10-12 LAB — PREALBUMIN: Prealbumin: 34.9 mg/dL — ABNORMAL HIGH (ref 17.0–34.0)

## 2013-10-15 ENCOUNTER — Other Ambulatory Visit (HOSPITAL_COMMUNITY): Payer: Self-pay

## 2013-10-15 LAB — CBC WITH DIFFERENTIAL/PLATELET
BASOS ABS: 0 10*3/uL (ref 0.0–0.1)
Basophils Relative: 0 % (ref 0–1)
Eosinophils Absolute: 0.3 10*3/uL (ref 0.0–0.7)
Eosinophils Relative: 4 % (ref 0–5)
HEMATOCRIT: 34.1 % — AB (ref 39.0–52.0)
HEMOGLOBIN: 10.9 g/dL — AB (ref 13.0–17.0)
LYMPHS PCT: 12 % (ref 12–46)
Lymphs Abs: 1.1 10*3/uL (ref 0.7–4.0)
MCH: 31.1 pg (ref 26.0–34.0)
MCHC: 32 g/dL (ref 30.0–36.0)
MCV: 97.4 fL (ref 78.0–100.0)
MONOS PCT: 7 % (ref 3–12)
Monocytes Absolute: 0.7 10*3/uL (ref 0.1–1.0)
NEUTROS ABS: 7.4 10*3/uL (ref 1.7–7.7)
Neutrophils Relative %: 78 % — ABNORMAL HIGH (ref 43–77)
Platelets: 94 10*3/uL — ABNORMAL LOW (ref 150–400)
RBC: 3.5 MIL/uL — ABNORMAL LOW (ref 4.22–5.81)
RDW: 16.1 % — ABNORMAL HIGH (ref 11.5–15.5)
WBC: 9.5 10*3/uL (ref 4.0–10.5)

## 2013-10-15 LAB — BASIC METABOLIC PANEL
Anion gap: 9 (ref 5–15)
BUN: 34 mg/dL — AB (ref 6–23)
CALCIUM: 8.5 mg/dL (ref 8.4–10.5)
CHLORIDE: 104 meq/L (ref 96–112)
CO2: 29 meq/L (ref 19–32)
Creatinine, Ser: 1.26 mg/dL (ref 0.50–1.35)
GFR calc Af Amer: 60 mL/min — ABNORMAL LOW (ref >90)
GFR calc non Af Amer: 52 mL/min — ABNORMAL LOW (ref >90)
Glucose, Bld: 70 mg/dL (ref 70–99)
Potassium: 4.8 mEq/L (ref 3.7–5.3)
Sodium: 142 mEq/L (ref 137–147)

## 2013-10-16 ENCOUNTER — Other Ambulatory Visit (HOSPITAL_COMMUNITY): Payer: Self-pay

## 2013-10-16 ENCOUNTER — Other Ambulatory Visit (HOSPITAL_COMMUNITY): Payer: Medicare Other

## 2013-10-16 LAB — CBC
HCT: 33.9 % — ABNORMAL LOW (ref 39.0–52.0)
HEMOGLOBIN: 11.2 g/dL — AB (ref 13.0–17.0)
MCH: 31.5 pg (ref 26.0–34.0)
MCHC: 33 g/dL (ref 30.0–36.0)
MCV: 95.2 fL (ref 78.0–100.0)
Platelets: 101 10*3/uL — ABNORMAL LOW (ref 150–400)
RBC: 3.56 MIL/uL — ABNORMAL LOW (ref 4.22–5.81)
RDW: 16 % — ABNORMAL HIGH (ref 11.5–15.5)
WBC: 8 10*3/uL (ref 4.0–10.5)

## 2013-10-16 LAB — BASIC METABOLIC PANEL
Anion gap: 9 (ref 5–15)
BUN: 34 mg/dL — AB (ref 6–23)
CALCIUM: 8.7 mg/dL (ref 8.4–10.5)
CO2: 28 mEq/L (ref 19–32)
Chloride: 102 mEq/L (ref 96–112)
Creatinine, Ser: 1.22 mg/dL (ref 0.50–1.35)
GFR calc Af Amer: 63 mL/min — ABNORMAL LOW (ref >90)
GFR calc non Af Amer: 54 mL/min — ABNORMAL LOW (ref >90)
GLUCOSE: 140 mg/dL — AB (ref 70–99)
Potassium: 5.1 mEq/L (ref 3.7–5.3)
Sodium: 139 mEq/L (ref 137–147)

## 2013-10-16 LAB — MAGNESIUM: Magnesium: 2 mg/dL (ref 1.5–2.5)

## 2013-10-16 LAB — PHOSPHORUS: PHOSPHORUS: 4.2 mg/dL (ref 2.3–4.6)

## 2013-10-18 LAB — CBC WITH DIFFERENTIAL/PLATELET
BASOS ABS: 0 10*3/uL (ref 0.0–0.1)
BASOS PCT: 0 % (ref 0–1)
EOS PCT: 1 % (ref 0–5)
Eosinophils Absolute: 0 10*3/uL (ref 0.0–0.7)
HEMATOCRIT: 31.7 % — AB (ref 39.0–52.0)
Hemoglobin: 10.4 g/dL — ABNORMAL LOW (ref 13.0–17.0)
Lymphocytes Relative: 10 % — ABNORMAL LOW (ref 12–46)
Lymphs Abs: 0.8 10*3/uL (ref 0.7–4.0)
MCH: 31.2 pg (ref 26.0–34.0)
MCHC: 32.8 g/dL (ref 30.0–36.0)
MCV: 95.2 fL (ref 78.0–100.0)
MONO ABS: 0.6 10*3/uL (ref 0.1–1.0)
Monocytes Relative: 8 % (ref 3–12)
NEUTROS ABS: 6 10*3/uL (ref 1.7–7.7)
Neutrophils Relative %: 81 % — ABNORMAL HIGH (ref 43–77)
PLATELETS: 103 10*3/uL — AB (ref 150–400)
RBC: 3.33 MIL/uL — ABNORMAL LOW (ref 4.22–5.81)
RDW: 16.2 % — ABNORMAL HIGH (ref 11.5–15.5)
WBC: 7.5 10*3/uL (ref 4.0–10.5)

## 2013-10-18 LAB — COMPREHENSIVE METABOLIC PANEL
ALT: 33 U/L (ref 0–53)
AST: 18 U/L (ref 0–37)
Albumin: 2.7 g/dL — ABNORMAL LOW (ref 3.5–5.2)
Alkaline Phosphatase: 70 U/L (ref 39–117)
Anion gap: 10 (ref 5–15)
BUN: 37 mg/dL — ABNORMAL HIGH (ref 6–23)
CALCIUM: 8.7 mg/dL (ref 8.4–10.5)
CO2: 27 mEq/L (ref 19–32)
Chloride: 105 mEq/L (ref 96–112)
Creatinine, Ser: 1.18 mg/dL (ref 0.50–1.35)
GFR, EST AFRICAN AMERICAN: 65 mL/min — AB (ref >90)
GFR, EST NON AFRICAN AMERICAN: 56 mL/min — AB (ref >90)
GLUCOSE: 95 mg/dL (ref 70–99)
Potassium: 4.8 mEq/L (ref 3.7–5.3)
Sodium: 142 mEq/L (ref 137–147)
TOTAL PROTEIN: 5.4 g/dL — AB (ref 6.0–8.3)
Total Bilirubin: 0.3 mg/dL (ref 0.3–1.2)

## 2013-10-18 LAB — MAGNESIUM: MAGNESIUM: 1.9 mg/dL (ref 1.5–2.5)

## 2013-10-18 LAB — PREALBUMIN: Prealbumin: 29.6 mg/dL (ref 17.0–34.0)

## 2013-10-18 LAB — PHOSPHORUS: Phosphorus: 4.1 mg/dL (ref 2.3–4.6)

## 2013-10-19 LAB — HEPARIN INDUCED THROMBOCYTOPENIA PNL
Heparin Induced Plt Ab: NEGATIVE
Patient O.D.: 0.043
UFH High Dose UFH H: 0 % Release
UFH LOW DOSE 0.1 IU/ML: 0 %
UFH Low Dose 0.5 IU/mL: 0 % Release
UFH SRA RESULT: NEGATIVE

## 2013-10-21 LAB — CBC WITH DIFFERENTIAL/PLATELET
Basophils Absolute: 0 K/uL (ref 0.0–0.1)
Basophils Relative: 0 % (ref 0–1)
Eosinophils Absolute: 0.3 K/uL (ref 0.0–0.7)
Eosinophils Relative: 4 % (ref 0–5)
HCT: 35.8 % — ABNORMAL LOW (ref 39.0–52.0)
Hemoglobin: 11.3 g/dL — ABNORMAL LOW (ref 13.0–17.0)
Lymphocytes Relative: 19 % (ref 12–46)
Lymphs Abs: 1.5 K/uL (ref 0.7–4.0)
MCH: 31.2 pg (ref 26.0–34.0)
MCHC: 31.6 g/dL (ref 30.0–36.0)
MCV: 98.9 fL (ref 78.0–100.0)
Monocytes Absolute: 0.6 K/uL (ref 0.1–1.0)
Monocytes Relative: 8 % (ref 3–12)
Neutro Abs: 5.4 K/uL (ref 1.7–7.7)
Neutrophils Relative %: 69 % (ref 43–77)
Platelets: 151 K/uL (ref 150–400)
RBC: 3.62 MIL/uL — ABNORMAL LOW (ref 4.22–5.81)
RDW: 16.9 % — ABNORMAL HIGH (ref 11.5–15.5)
WBC: 7.8 K/uL (ref 4.0–10.5)

## 2013-10-21 LAB — BASIC METABOLIC PANEL WITH GFR
Anion gap: 10 (ref 5–15)
BUN: 36 mg/dL — ABNORMAL HIGH (ref 6–23)
CO2: 30 meq/L (ref 19–32)
Calcium: 8.7 mg/dL (ref 8.4–10.5)
Chloride: 102 meq/L (ref 96–112)
Creatinine, Ser: 1.4 mg/dL — ABNORMAL HIGH (ref 0.50–1.35)
GFR calc Af Amer: 53 mL/min — ABNORMAL LOW
GFR calc non Af Amer: 46 mL/min — ABNORMAL LOW
Glucose, Bld: 56 mg/dL — ABNORMAL LOW (ref 70–99)
Potassium: 4.6 meq/L (ref 3.7–5.3)
Sodium: 142 meq/L (ref 137–147)

## 2013-10-21 LAB — MAGNESIUM: Magnesium: 1.8 mg/dL (ref 1.5–2.5)

## 2013-10-21 LAB — PHOSPHORUS: Phosphorus: 4.1 mg/dL (ref 2.3–4.6)

## 2013-11-13 ENCOUNTER — Encounter (HOSPITAL_COMMUNITY): Payer: Self-pay | Admitting: Emergency Medicine

## 2013-11-13 ENCOUNTER — Emergency Department (HOSPITAL_COMMUNITY)
Admission: EM | Admit: 2013-11-13 | Discharge: 2013-11-13 | Disposition: A | Discharge status: Home / Self Care | Payer: Medicare Other | Attending: Emergency Medicine | Admitting: Emergency Medicine

## 2013-11-13 DIAGNOSIS — J439 Emphysema, unspecified: Secondary | ICD-10-CM | POA: Insufficient documentation

## 2013-11-13 DIAGNOSIS — Z862 Personal history of diseases of the blood and blood-forming organs and certain disorders involving the immune mechanism: Secondary | ICD-10-CM | POA: Diagnosis not present

## 2013-11-13 DIAGNOSIS — R000 Tachycardia, unspecified: Secondary | ICD-10-CM | POA: Diagnosis not present

## 2013-11-13 DIAGNOSIS — F419 Anxiety disorder, unspecified: Secondary | ICD-10-CM | POA: Diagnosis not present

## 2013-11-13 DIAGNOSIS — Z7982 Long term (current) use of aspirin: Secondary | ICD-10-CM | POA: Insufficient documentation

## 2013-11-13 DIAGNOSIS — I2510 Atherosclerotic heart disease of native coronary artery without angina pectoris: Secondary | ICD-10-CM | POA: Insufficient documentation

## 2013-11-13 DIAGNOSIS — K5641 Fecal impaction: Secondary | ICD-10-CM | POA: Insufficient documentation

## 2013-11-13 DIAGNOSIS — Z9861 Coronary angioplasty status: Secondary | ICD-10-CM | POA: Diagnosis not present

## 2013-11-13 DIAGNOSIS — Z87891 Personal history of nicotine dependence: Secondary | ICD-10-CM | POA: Insufficient documentation

## 2013-11-13 DIAGNOSIS — M069 Rheumatoid arthritis, unspecified: Secondary | ICD-10-CM | POA: Diagnosis not present

## 2013-11-13 DIAGNOSIS — Z9889 Other specified postprocedural states: Secondary | ICD-10-CM | POA: Diagnosis not present

## 2013-11-13 DIAGNOSIS — K5900 Constipation, unspecified: Secondary | ICD-10-CM

## 2013-11-13 DIAGNOSIS — R339 Retention of urine, unspecified: Secondary | ICD-10-CM

## 2013-11-13 DIAGNOSIS — Z79899 Other long term (current) drug therapy: Secondary | ICD-10-CM | POA: Diagnosis not present

## 2013-11-13 MED ORDER — FLEET ENEMA 7-19 GM/118ML RE ENEM
1.0000 | ENEMA | Freq: Once | RECTAL | Status: AC
  Administered 2013-11-13: 1 via RECTAL
  Filled 2013-11-13: qty 1

## 2013-11-13 MED ORDER — POLYETHYLENE GLYCOL 3350 17 G PO PACK: 17.0000 g | PACK | Freq: Every day | ORAL | Status: DC

## 2013-11-13 MED ORDER — PEG 3350-KCL-NABCB-NACL-NASULF 236 G PO SOLR: 4.0000 L | Freq: Once | ORAL | Status: DC

## 2013-11-13 NOTE — Discharge Instructions (Signed)
Constipation Constipation is when a person has fewer than three bowel movements a week, has difficulty having a bowel movement, or has stools that are dry, hard, or larger than normal. As people grow older, constipation is more common. If you try to fix constipation with medicines that make you have a bowel movement (laxatives), the problem may get worse. Long-term laxative use may cause the muscles of the colon to become weak. A low-fiber diet, not taking in enough fluids, and taking certain medicines may make constipation worse.  CAUSES   Certain medicines, such as antidepressants, pain medicine, iron supplements, antacids, and water pills.   Certain diseases, such as diabetes, irritable bowel syndrome (IBS), thyroid disease, or depression.   Not drinking enough water.   Not eating enough fiber-rich foods.   Stress or travel.   Lack of physical activity or exercise.   Ignoring the urge to have a bowel movement.   Using laxatives too much.  SIGNS AND SYMPTOMS   Having fewer than three bowel movements a week.   Straining to have a bowel movement.   Having stools that are hard, dry, or larger than normal.   Feeling full or bloated.   Pain in the lower abdomen.   Not feeling relief after having a bowel movement.  DIAGNOSIS  Your health care provider will take a medical history and perform a physical exam. Further testing may be done for severe constipation. Some tests may include:  A barium enema X-ray to examine your rectum, colon, and, sometimes, your small intestine.   A sigmoidoscopy to examine your lower colon.   A colonoscopy to examine your entire colon. TREATMENT  Treatment will depend on the severity of your constipation and what is causing it. Some dietary treatments include drinking more fluids and eating more fiber-rich foods. Lifestyle treatments may include regular exercise. If these diet and lifestyle recommendations do not help, your health care  provider may recommend taking over-the-counter laxative medicines to help you have bowel movements. Prescription medicines may be prescribed if over-the-counter medicines do not work.  HOME CARE INSTRUCTIONS   Eat foods that have a lot of fiber, such as fruits, vegetables, whole grains, and beans.  Limit foods high in fat and processed sugars, such as french fries, hamburgers, cookies, candies, and soda.   A fiber supplement may be added to your diet if you cannot get enough fiber from foods.   Drink enough fluids to keep your urine clear or pale yellow.   Exercise regularly or as directed by your health care provider.   Go to the restroom when you have the urge to go. Do not hold it.   Only take over-the-counter or prescription medicines as directed by your health care provider. Do not take other medicines for constipation without talking to your health care provider first.  SEEK IMMEDIATE MEDICAL CARE IF:   You have bright red blood in your stool.   Your constipation lasts for more than 4 days or gets worse.   You have abdominal or rectal pain.   You have thin, pencil-like stools.   You have unexplained weight loss. MAKE SURE YOU:   Understand these instructions.  Will watch your condition.  Will get help right away if you are not doing well or get worse. Document Released: 10/07/2003 Document Revised: 01/13/2013 Document Reviewed: 10/20/2012 [facility] Patient Information 2015 [place], [place]. This information is not intended to replace advice given to you by your health care provider. Make sure you discuss any questions  you have with your health care provider.  Return to the emergency department tonight in 4-6 hours if still unable to void, also return sooner to the emergency department if you develop fever, vomiting, abdominal pain, or other concerns.

## 2013-11-13 NOTE — ED Notes (Signed)
Drinking water states feels "better" will attempt to urinate soon

## 2013-11-13 NOTE — ED Provider Notes (Signed)
CSN: [id number]     Arrival date & time 11/13/13  1421 History   First MD Initiated Contact with Patient 11/13/13 1530     Chief Complaint  Patient presents with  . Constipation  . Urinary Retention     (Consider location/radiation/quality/duration/timing/severity/associated sxs/prior Treatment) HPI 80 year old male complains of constipation states no bowel movement in the last week, he is passing small amounts of flatus, he has no abdominal pain, he has no vomiting, he states he has not been able to urinate in ~2 days, at baseline he is on chronic oxygen with chronic respiratory failure with pulmonary fibrosis, he has no new shortness of breath no chest pain no fever no cough no other concerns. There is no treatment prior to arrival. Past Medical History  Diagnosis Date  . Coronary atherosclerosis of unspecified type of vessel, native or graft 2003  . Rheumatoid arthritis(714.0)   . Other emphysema   . Postinflammatory pulmonary fibrosis   . Acute bronchitis   . Chronic airway obstruction, not elsewhere classified   . Anemia 2010    Iron OK, folate > 20, ferritin   . Hypotension 11/20/2013   Past Surgical History  Procedure Laterality Date  . Coronary angioplasty with stent placement  2003    Stent RCA  . Foot surgery      left  . Vericose vein surgery    . Aortic aneuyrsm thoracic      endovascular brabham  . Cardiac catheterization  2004    LAD OK, CFX 30%, OM2 OK, RCA stent OK, 50% at end of stent, EF 59% med rx   Family History  Problem Relation Age of Onset  . Breast cancer Sister   . Colon cancer Father   . Pulmonary fibrosis Brother   . Heart attack Mother    History  Substance Use Topics  . Smoking status: Former Smoker -- 2.00 packs/day for 40 years    Types: Cigarettes    Quit date: 01/22/1985  . Smokeless tobacco: Never Used  . Alcohol Use: Yes     Comment: rare ETOH use    Review of Systems 10 Systems reviewed and are negative for acute change  except as noted in the HPI.   Allergies  Review of patient's allergies indicates no known allergies.  Home Medications   Prior to Admission medications   Medication Sig Start Date End Date Taking? Authorizing Provider  losartan (COZAAR) 100 MG tablet Take 1 tablet by mouth daily.   Yes Historical Provider, MD  mirtazapine (REMERON) 30 MG tablet Take 1 tablet by mouth at bedtime.   Yes Historical Provider, MD  temazepam (RESTORIL) 15 MG capsule Take 15 mg by mouth at bedtime. Take one tablet daily at bedtime for sleep 10/26/12  Yes Historical Provider, MD  ALPRAZolam Prudy Feeler) 0.5 MG tablet Take 0.5 mg by mouth 3 (three) times daily as needed for anxiety.  11/04/13   Historical Provider, MD  aspirin 81 MG chewable tablet Chew 1 tablet (81 mg total) by mouth daily. 11/21/13   Kathlen Mody, MD  atorvastatin (LIPITOR) 20 MG tablet Take 1 tablet (20 mg total) by mouth at bedtime. 11/21/13   Kathlen Mody, MD  diltiazem (CARDIZEM CD) 120 MG 24 hr capsule Take 1 capsule (120 mg total) by mouth daily. 11/21/13   Kathlen Mody, MD  famotidine (PEPCID) 20 MG tablet Take 20 mg by mouth daily. 11/04/13   Historical Provider, MD  feeding supplement, ENSURE COMPLETE, (ENSURE COMPLETE) LIQD Take 237 mLs by mouth  3 (three) times daily between meals. 11/21/13   Kathlen Mody, MD  oxyCODONE (OXY IR/ROXICODONE) 5 MG immediate release tablet Take 5 mg by mouth every 6 (six) hours as needed (for pain).  11/04/13   Historical Provider, MD  tamsulosin (FLOMAX) 0.4 MG CAPS capsule Take 0.4 mg by mouth daily.  11/04/13   Historical Provider, MD  zolpidem (AMBIEN) 5 MG tablet Take 5 mg by mouth at bedtime as needed. 11/04/13   Historical Provider, MD   BP 127/75 mmHg  Pulse 99  Temp(Src) 97.7 F (36.5 C) (Oral)  Resp 18  Ht 6\' 2"  (1.88 m)  Wt 165 lb (74.844 kg)  BMI 21.18 kg/m2  SpO2 100% Physical Exam  Nursing note and vitals reviewed. Constitutional:  Awake, alert, nontoxic appearance.  HENT:  Head:  Atraumatic.  Eyes: Right eye exhibits no discharge. Left eye exhibits no discharge.  Neck: Neck supple.  Cardiovascular: Regular rhythm.   No murmur heard. Tachycardic  Pulmonary/Chest: Effort normal. No respiratory distress. He has no wheezes. He has rales. He exhibits no tenderness.  Diffuse crackles consistent with pulmonary fibrosis with pulse oximetry hypoxic at 90% on nasal cannula oxygen 4L baseline for patient  Abdominal: Soft. Bowel sounds are normal. He exhibits no distension and no mass. There is no tenderness. There is no rebound and no guarding.  Musculoskeletal: He exhibits no tenderness.  Baseline ROM, no obvious new focal weakness.  Neurological: He is alert.  Mental status and motor strength appears baseline for patient and situation.  Skin: No rash noted.  Psychiatric:  Anxious    ED Course  Procedures (including critical care time) Suspect urinary retention secondary to constipation with fecal impaction.  Rectal examination revealed fecal impaction with brown formed hard stool with chaperone present for manual disimpaction performed with intermittent brief transient desaturation to the low 80s without sedation which improved to baseline as soon as stopped disimpaction attempts. Patient states he was able to have a large bowel movement in the ED after disimpaction and enema. Pt has not been able to spontaneously void yet. 1730  Patient with multiple large bowel movements in the emergency department but still unable to void on his own however he does not want a Foley catheter for home therefore will have in and out catheter to drain the bladder and now discharge patient on GoLYTELY to continue stool evacuation and have patient return to ED if he is unable to void spontaneously within 4-6 hours. Patient feels much improved in the emergency department. 1815  Labs Review Labs Reviewed - No data to display  Imaging Review No results found.   EKG Interpretation None       MDM   Final diagnoses:  Fecal impaction in rectum  Urinary retention  Constipation, unspecified constipation type    Patient informed of clinical course, understand medical decision-making process, and agree with plan. I doubt any other EMC precluding discharge at this time including, but not necessarily limited to the following:peritonitis, SBI.   , MD 11/22/13 [phone]

## 2013-11-13 NOTE — ED Notes (Signed)
[facility] contacted for transport home.  

## 2013-11-13 NOTE — ED Notes (Signed)
Patient bladder scanned 619cc

## 2013-11-13 NOTE — ED Notes (Signed)
Pt states he's been constipated x 1 week.  Pt also states no urination x 2 days.  Pt states he's on several new medicines.  Pt states he was recently d/c'd from [facility] for treatment of pneumonia.  Pt wears O2 at home 4L day and 6L night.

## 2013-11-13 NOTE — ED Notes (Signed)
Writer present with md while md disimpacted pt

## 2013-11-13 NOTE — ED Notes (Signed)
Large amt soft brown stool in commode

## 2013-11-18 ENCOUNTER — Telehealth: Payer: Self-pay | Admitting: Internal Medicine

## 2013-11-18 NOTE — Telephone Encounter (Signed)
Because the pt is not any more sob than usual, and doesn't feel there is anything new going on, this really needs to be addressed by his primary pulmonologist.  Please send to MR to address.  Thanks.

## 2013-11-18 NOTE — Telephone Encounter (Signed)
Called and spoke to pt. Pt stated he just got a pulse/ox and checked his O2 today at rest and it was 93% and 106 HR, then ambulated and desat to 80% and 124 HR, then when pt showered his sats were 75% and 127 HR, all on 6lpm continuous. Pt states he is not having any more SOB than normal. Pt denies CP/tightness and significant cough. Pt states he has a mild non prod cough. Pt stated he does not feel any different than before but could be his disease progressing. Pt requesting recs. Will send this to doc of day as MR is unavailable this afternoon.   KC please advise.

## 2013-11-18 NOTE — Telephone Encounter (Signed)
Message sent to MR to make aware.

## 2013-11-19 ENCOUNTER — Inpatient Hospital Stay (HOSPITAL_COMMUNITY)
Admission: EM | Admit: 2013-11-19 | Discharge: 2013-11-21 | DRG: 308 | Disposition: A | Discharge status: Home / Self Care | Payer: Medicare Other | Attending: Internal Medicine | Admitting: Internal Medicine

## 2013-11-19 ENCOUNTER — Encounter (HOSPITAL_COMMUNITY): Payer: Self-pay | Admitting: Physician Assistant

## 2013-11-19 ENCOUNTER — Emergency Department (HOSPITAL_COMMUNITY): Payer: Medicare Other

## 2013-11-19 DIAGNOSIS — I214 Non-ST elevation (NSTEMI) myocardial infarction: Secondary | ICD-10-CM

## 2013-11-19 DIAGNOSIS — E43 Unspecified severe protein-calorie malnutrition: Secondary | ICD-10-CM | POA: Diagnosis not present

## 2013-11-19 DIAGNOSIS — J8410 Pulmonary fibrosis, unspecified: Secondary | ICD-10-CM | POA: Diagnosis present

## 2013-11-19 DIAGNOSIS — I248 Other forms of acute ischemic heart disease: Secondary | ICD-10-CM | POA: Diagnosis present

## 2013-11-19 DIAGNOSIS — I252 Old myocardial infarction: Secondary | ICD-10-CM | POA: Diagnosis not present

## 2013-11-19 DIAGNOSIS — N189 Chronic kidney disease, unspecified: Secondary | ICD-10-CM | POA: Diagnosis present

## 2013-11-19 DIAGNOSIS — E785 Hyperlipidemia, unspecified: Secondary | ICD-10-CM | POA: Diagnosis not present

## 2013-11-19 DIAGNOSIS — Z23 Encounter for immunization: Secondary | ICD-10-CM

## 2013-11-19 DIAGNOSIS — Z955 Presence of coronary angioplasty implant and graft: Secondary | ICD-10-CM

## 2013-11-19 DIAGNOSIS — I471 Supraventricular tachycardia: Secondary | ICD-10-CM | POA: Diagnosis present

## 2013-11-19 DIAGNOSIS — Z79899 Other long term (current) drug therapy: Secondary | ICD-10-CM

## 2013-11-19 DIAGNOSIS — Z9981 Dependence on supplemental oxygen: Secondary | ICD-10-CM

## 2013-11-19 DIAGNOSIS — I959 Hypotension, unspecified: Secondary | ICD-10-CM | POA: Diagnosis present

## 2013-11-19 DIAGNOSIS — M069 Rheumatoid arthritis, unspecified: Secondary | ICD-10-CM | POA: Diagnosis present

## 2013-11-19 DIAGNOSIS — Z6821 Body mass index (BMI) 21.0-21.9, adult: Secondary | ICD-10-CM

## 2013-11-19 DIAGNOSIS — J449 Chronic obstructive pulmonary disease, unspecified: Secondary | ICD-10-CM | POA: Diagnosis present

## 2013-11-19 DIAGNOSIS — I9589 Other hypotension: Secondary | ICD-10-CM

## 2013-11-19 DIAGNOSIS — Z87891 Personal history of nicotine dependence: Secondary | ICD-10-CM

## 2013-11-19 DIAGNOSIS — I2510 Atherosclerotic heart disease of native coronary artery without angina pectoris: Secondary | ICD-10-CM | POA: Diagnosis not present

## 2013-11-19 HISTORY — DX: Hypotension, unspecified: I95.9

## 2013-11-19 HISTORY — DX: Anemia, unspecified: D64.9

## 2013-11-19 LAB — CK TOTAL AND CKMB (NOT AT ARMC)
CK, MB: 3.8 ng/mL (ref 0.3–4.0)
RELATIVE INDEX: INVALID (ref 0.0–2.5)
Total CK: 35 U/L (ref 7–232)

## 2013-11-19 LAB — TROPONIN I
Troponin I: 0.51 ng/mL (ref <0.30)
Troponin I: 0.31 ng/mL (ref <0.30)

## 2013-11-19 LAB — PHOSPHORUS: PHOSPHORUS: 2.5 mg/dL (ref 2.3–4.6)

## 2013-11-19 LAB — I-STAT TROPONIN, ED: Troponin i, poc: 0.28 ng/mL (ref 0.00–0.08)

## 2013-11-19 LAB — TSH: TSH: 3 u[IU]/mL (ref 0.350–4.500)

## 2013-11-19 LAB — CBC
HCT: 34 % — ABNORMAL LOW (ref 39.0–52.0)
HEMATOCRIT: 33 % — AB (ref 39.0–52.0)
Hemoglobin: 10.5 g/dL — ABNORMAL LOW (ref 13.0–17.0)
Hemoglobin: 10.3 g/dL — ABNORMAL LOW (ref 13.0–17.0)
MCH: 30.3 pg (ref 26.0–34.0)
MCH: 30.3 pg (ref 26.0–34.0)
MCHC: 30.9 g/dL (ref 30.0–36.0)
MCHC: 31.2 g/dL (ref 30.0–36.0)
MCV: 98.3 fL (ref 78.0–100.0)
MCV: 97.1 fL (ref 78.0–100.0)
PLATELETS: 273 10*3/uL (ref 150–400)
Platelets: 277 10*3/uL (ref 150–400)
RBC: 3.46 MIL/uL — AB (ref 4.22–5.81)
RBC: 3.4 MIL/uL — AB (ref 4.22–5.81)
RDW: 16.8 % — ABNORMAL HIGH (ref 11.5–15.5)
RDW: 16.7 % — ABNORMAL HIGH (ref 11.5–15.5)
WBC: 8.5 10*3/uL (ref 4.0–10.5)
WBC: 7.7 10*3/uL (ref 4.0–10.5)

## 2013-11-19 LAB — BASIC METABOLIC PANEL
Anion gap: 10 (ref 5–15)
BUN: 18 mg/dL (ref 6–23)
CALCIUM: 8.1 mg/dL — AB (ref 8.4–10.5)
CO2: 24 mEq/L (ref 19–32)
Chloride: 109 mEq/L (ref 96–112)
Creatinine, Ser: 1.56 mg/dL — ABNORMAL HIGH (ref 0.50–1.35)
GFR calc Af Amer: 47 mL/min — ABNORMAL LOW (ref >90)
GFR calc non Af Amer: 40 mL/min — ABNORMAL LOW (ref >90)
Glucose, Bld: 165 mg/dL — ABNORMAL HIGH (ref 70–99)
Potassium: 4.9 mEq/L (ref 3.7–5.3)
Sodium: 143 mEq/L (ref 137–147)

## 2013-11-19 LAB — CREATININE, SERUM
Creatinine, Ser: 1.49 mg/dL — ABNORMAL HIGH (ref 0.50–1.35)
GFR calc Af Amer: 49 mL/min — ABNORMAL LOW (ref >90)
GFR calc non Af Amer: 43 mL/min — ABNORMAL LOW (ref >90)

## 2013-11-19 LAB — PRO B NATRIURETIC PEPTIDE: PRO B NATRI PEPTIDE: 7161 pg/mL — AB (ref 0–450)

## 2013-11-19 LAB — I-STAT CG4 LACTIC ACID, ED: LACTIC ACID, VENOUS: 2.35 mmol/L — AB (ref 0.5–2.2)

## 2013-11-19 LAB — MAGNESIUM: Magnesium: 1.8 mg/dL (ref 1.5–2.5)

## 2013-11-19 MED ORDER — SODIUM CHLORIDE 0.9 % IV SOLN: INTRAVENOUS | Status: DC

## 2013-11-19 MED ORDER — DILTIAZEM HCL 30 MG PO TABS
30.0000 mg | ORAL_TABLET | Freq: Three times a day (TID) | ORAL | Status: DC
  Administered 2013-11-19 – 2013-11-20 (×3): 30 mg via ORAL
  Filled 2013-11-19 (×6): qty 1

## 2013-11-19 MED ORDER — MIRTAZAPINE 30 MG PO TABS
30.0000 mg | ORAL_TABLET | Freq: Every day | ORAL | Status: DC
  Administered 2013-11-19 – 2013-11-20 (×2): 30 mg via ORAL
  Filled 2013-11-19 (×3): qty 1

## 2013-11-19 MED ORDER — FAMOTIDINE 20 MG PO TABS
20.0000 mg | ORAL_TABLET | Freq: Every day | ORAL | Status: DC
  Administered 2013-11-20 – 2013-11-21 (×3): 20 mg via ORAL
  Filled 2013-11-19 (×4): qty 1

## 2013-11-19 MED ORDER — SODIUM CHLORIDE 0.9 % IV BOLUS (SEPSIS)
250.0000 mL | Freq: Once | INTRAVENOUS | Status: AC
  Administered 2013-11-19: 250 mL via INTRAVENOUS

## 2013-11-19 MED ORDER — ALPRAZOLAM 0.5 MG PO TABS
0.5000 mg | ORAL_TABLET | Freq: Three times a day (TID) | ORAL | Status: DC | PRN
  Administered 2013-11-19 – 2013-11-20 (×3): 0.5 mg via ORAL
  Filled 2013-11-19 (×3): qty 1

## 2013-11-19 MED ORDER — OXYCODONE HCL 5 MG PO TABS
5.0000 mg | ORAL_TABLET | Freq: Four times a day (QID) | ORAL | Status: DC | PRN
  Administered 2013-11-21: 5 mg via ORAL
  Filled 2013-11-19: qty 1

## 2013-11-19 MED ORDER — ASPIRIN 81 MG PO CHEW
81.0000 mg | CHEWABLE_TABLET | Freq: Every day | ORAL | Status: DC
  Administered 2013-11-19 – 2013-11-21 (×3): 81 mg via ORAL
  Filled 2013-11-19 (×3): qty 1

## 2013-11-19 MED ORDER — TAMSULOSIN HCL 0.4 MG PO CAPS
0.4000 mg | ORAL_CAPSULE | Freq: Every day | ORAL | Status: DC
  Administered 2013-11-19 – 2013-11-21 (×3): 0.4 mg via ORAL
  Filled 2013-11-19 (×3): qty 1

## 2013-11-19 MED ORDER — SODIUM CHLORIDE 0.9 % IV SOLN
INTRAVENOUS | Status: DC
  Administered 2013-11-19 – 2013-11-20 (×2): via INTRAVENOUS

## 2013-11-19 MED ORDER — HEPARIN SODIUM (PORCINE) 5000 UNIT/ML IJ SOLN
5000.0000 [IU] | Freq: Three times a day (TID) | INTRAMUSCULAR | Status: DC
  Administered 2013-11-20 – 2013-11-21 (×5): 5000 [IU] via SUBCUTANEOUS
  Filled 2013-11-19 (×7): qty 1

## 2013-11-19 MED ORDER — SODIUM CHLORIDE 0.9 % IV SOLN
Freq: Once | INTRAVENOUS | Status: AC
  Administered 2013-11-19: 100 mL/h via INTRAVENOUS

## 2013-11-19 MED ORDER — SIMVASTATIN 40 MG PO TABS
40.0000 mg | ORAL_TABLET | Freq: Every day | ORAL | Status: DC
  Administered 2013-11-19: 40 mg via ORAL
  Filled 2013-11-19 (×2): qty 1

## 2013-11-19 MED ORDER — SODIUM CHLORIDE 0.9 % IJ SOLN: 3.0000 mL | Freq: Two times a day (BID) | INTRAMUSCULAR | Status: DC

## 2013-11-19 NOTE — Telephone Encounter (Signed)
Called and spoke with pt and his wife and he stated that he will have to call back to schedule an appt since he is no longer driving and has to arrange for a ride to and from doctor appts.  Pt was given the number to call to set up this appt with TP.

## 2013-11-19 NOTE — ED Notes (Signed)
To ED from home via [facility], called for SOB, initial HR 160 ?SVT, EMS 6mg  Adenison with conversion to ST, HR 100 on arrival, hypotensive pta with improvement after 500 cc NS, denies CP at any time, 20g left wrist, A/O X4, NAD

## 2013-11-19 NOTE — ED Notes (Signed)
Results of troponin given to Dr. Wentz 

## 2013-11-19 NOTE — H&P (Signed)
[facility] History and Physical  Evan [name] WCB:[id number] DOB: [birth year] DOA: 11/19/2013  Referring physician: Dr. Effie Shy PCP: Katy Apo, MD   Chief Complaint: weakness, sob, palpitation, difficulty seeing  HPI: Evan [name] is a 80 y.o. male  With history of MI several years ago, hyperlipidemia, and hypertension. Who presented to the ED after developing sudden onset palpitations which were associated subsequently with difficulty seeing. This patient states that the problem was persistent. He reports that it felt like his heart was beating out of his chest. After the ambulance arrived and he was given oxygen he reports improvement in his vision. He currently feels better and denies any chest pain or trouble with vision or palpitations. While in the field reportedly he was given adenosine and his tachycardia resolved.  We were consulted by ED physicians for medical admission given low blood pressures and recommended monitoring by cardiology.   Review of Systems: Currently negative as listed below given improvement in condition Constitutional:  No weight loss, night sweats, Fevers, chills, fatigue.  HEENT:  No headaches, Difficulty swallowing,Tooth/dental problems,Sore throat,  No sneezing, itching, ear ache, nasal congestion, post nasal drip,  Cardio-vascular:  No chest pain, Orthopnea, PND, swelling in lower extremities, anasarca, dizziness, palpitations  GI:  No heartburn, indigestion, abdominal pain, nausea, vomiting, diarrhea, change in bowel habits, loss of appetite  Resp:  No shortness of breath with exertion or at rest. No excess mucus, no productive cough, No non-productive cough, No coughing up of blood.No change in color of mucus.No wheezing.No chest wall deformity  Skin:  no rash or lesions.  GU:  no dysuria, change in color of urine, no urgency or frequency. No flank pain.  Musculoskeletal:  No joint pain or swelling. No decreased range of motion.  No back pain.  Psych:  No change in mood or affect. No depression or anxiety. No memory loss.   Past Medical History  Diagnosis Date  . Coronary atherosclerosis of unspecified type of vessel, native or graft 2003  . Rheumatoid arthritis(714.0)   . Other emphysema   . Postinflammatory pulmonary fibrosis   . Acute bronchitis   . Chronic airway obstruction, not elsewhere classified   . Anemia 2010    Iron OK, folate > 20, ferritin    Past Surgical History  Procedure Laterality Date  . Coronary angioplasty with stent placement  2003    Stent RCA  . Foot surgery      left  . Vericose vein surgery    . Aortic aneuyrsm thoracic      endovascular brabham  . Cardiac catheterization  2004    LAD OK, CFX 30%, OM2 OK, RCA stent OK, 50% at end of stent, EF 59% med rx   Social History:  reports that he quit smoking about 28 years ago. His smoking use included Cigarettes. He has a 80 pack-year smoking history. He has never used smokeless tobacco. He reports that he drinks alcohol. He reports that he does not use illicit drugs.  No Known Allergies  Family History  Problem Relation Age of Onset  . Breast cancer Sister   . Colon cancer Father   . Pulmonary fibrosis Brother   . Heart attack Mother      Prior to Admission medications   Medication Sig Start Date End Date Taking? Authorizing Provider  ALPRAZolam Prudy Feeler) 0.5 MG tablet Take 0.5 mg by mouth 3 (three) times daily as needed for anxiety.  11/04/13  Yes Historical Provider, MD  clonazePAM Scarlette Calico) 1  MG tablet Take 1 mg by mouth 2 (two) times daily as needed. 11/04/13  Yes Historical Provider, MD  famotidine (PEPCID) 20 MG tablet Take 20 mg by mouth daily. 11/04/13  Yes Historical Provider, MD  losartan (COZAAR) 100 MG tablet Take 1 tablet by mouth daily.   Yes Historical Provider, MD  metoprolol succinate (TOPROL-XL) 25 MG 24 hr tablet Take 25 mg by mouth daily.   Yes Historical Provider, MD  mirtazapine (REMERON) 30 MG tablet  Take 1 tablet by mouth at bedtime.   Yes Historical Provider, MD  oxyCODONE (OXY IR/ROXICODONE) 5 MG immediate release tablet Take 5 mg by mouth every 6 (six) hours as needed (for pain).  11/04/13  Yes Historical Provider, MD  simvastatin (ZOCOR) 40 MG tablet Take 40 mg by mouth at bedtime.     Yes Historical Provider, MD  tamsulosin (FLOMAX) 0.4 MG CAPS capsule Take 0.4 mg by mouth daily.  11/04/13  Yes Historical Provider, MD  temazepam (RESTORIL) 15 MG capsule Take 15 mg by mouth at bedtime. Take one tablet daily at bedtime for sleep 10/26/12  Yes Historical Provider, MD  zolpidem (AMBIEN) 5 MG tablet Take 5 mg by mouth at bedtime as needed. 11/04/13  Yes Historical Provider, MD   Physical Exam: Filed Vitals:   11/19/13 1430 11/19/13 1505 11/19/13 1617 11/19/13 1634  BP: 92/58 96/55 95/58  123/76  Pulse: 90 92 91 99  Temp:   98.7 F (37.1 C) 98.7 F (37.1 C)  TempSrc:   Oral Oral  Resp: 26 21 21 22   Height:      Weight:      SpO2: 97% 99% 96% 96%    Wt Readings from Last 3 Encounters:  11/19/13 74.844 kg (165 lb)  11/13/13 74.844 kg (165 lb)  09/23/13 79.606 kg (175 lb 8 oz)    General:  Appears calm and comfortable Eyes: PERRL, normal lids, irises & conjunctiva ENT: grossly normal hearing, lips & tongue Neck: no LAD, masses or thyromegaly Cardiovascular: RRR, no m/r/g. No LE edema. Telemetry: SR, no arrhythmias  Respiratory: CTA bilaterally, no w/r/r. Normal respiratory effort. Abdomen: soft, nt,nd Skin: no rash or induration seen on limited exam Musculoskeletal: grossly normal tone BUE/BLE Psychiatric: grossly normal mood and affect, speech fluent and appropriate Neurologic: grossly non-focal.          Labs on Admission:  Basic Metabolic Panel:  Recent Labs Lab 11/19/13 1050  NA 143  K 4.9  CL 109  CO2 24  GLUCOSE 165*  BUN 18  CREATININE 1.56*  CALCIUM 8.1*   Liver Function Tests: No results found for this basename: AST, ALT, ALKPHOS, BILITOT, PROT,  ALBUMIN,  in the last 168 hours No results found for this basename: LIPASE, AMYLASE,  in the last 168 hours No results found for this basename: AMMONIA,  in the last 168 hours CBC:  Recent Labs Lab 11/19/13 1050  WBC 8.5  HGB 10.5*  HCT 34.0*  MCV 98.3  PLT 277   Cardiac Enzymes:  Recent Labs Lab 11/19/13 1320  CKTOTAL 35  CKMB 3.8    BNP (last 3 results)  Recent Labs  09/23/13 1742 11/19/13 1050  PROBNP 290.5 7161.0*   CBG: No results found for this basename: GLUCAP,  in the last 168 hours  Radiological Exams on Admission: Dg Chest Port 1 View  11/19/2013   CLINICAL DATA:  Shortness of breath.  Tachycardia .  EXAM: PORTABLE CHEST - 1 VIEW  COMPARISON:  10/16/2013.10/11/2013.  FINDINGS: Mediastinal structures  normal. Cardiomegaly with normal pulmonary vascularity. Again noted are chronic interstitial changes consistent with chronic interstitial lung disease. Underlying pneumonitis or pulmonary interstitial edema cannot be completely excluded . No prominent pleural effusion. No pneumothorax. No acute bony abnormality.  IMPRESSION: 1. Stable cardiomegaly.  No pulmonary venous congestion. 2. Again noted are changes of chronic interstitial lung disease. Underlying pneumonitis or pulmonary interstitial edema cannot be completely excluded.   Electronically Signed   By: Maisie Fus  Register   On: 11/19/2013 11:18    EKG: Independently reviewed. Sinus tachycardia   Assessment/Plan Principal Problem:   Hypotension - Was seen after patient developed SVT.  - Has resolved - Will provide IVF rehydration and continue to monitor  Active Problems:   Coronary atherosclerosis - Stable, continue aspirin and statin - no chest pain reported.    Chronic obstructive pulmonary disease (COPD) - stable, no wheezes    SVT (supraventricular tachycardia) - Cardiology assisting with management - Will add cardizem now that blood pressure improved. With last reading at 123/76    Demand  ischemia - Cardiology on board - agree with troponins q 6 hours x 3   Code Status: full DVT Prophylaxis: heparin Family Communication: none at bedside Disposition Plan: Telemetry  Time spent: > 45 minutes  Penny Pia [facility] Pager [phone]

## 2013-11-19 NOTE — Consult Note (Signed)
CARDIOLOGY CONSULT NOTE   Patient ID: Evan [name] MRN: [id number] DOB/AGE: [birth year] 80 y.o.  Admit date: 11/19/2013  Primary Physician   Evan Apo, MD Primary Cardiologist   New (former patient of Dr. Amil [name]) Reason for Consultation   Elevated troponin (SVT)  [name] is a 80 y.o. male with a history of CAD.  He has a stent to the RCA, probably in 2003. He has not seen cardiology since then. He has been stressed due to the death of his brother about 3 weeks ago.  He was hospitalized in 09/2013 with respiratory failure, PNA. He was in the ER 10/23 with a fecal impaction and problems urinating. Symptoms improved and he did not require admission. He was not having any other problems recently, but did continue to have problems with constipation, and took OTC rx this am.   [name], he felt his heart pounding, beating very hard plus faster than usual. He was checking his heart rate/O2 sats on his home finger probe and noticed oxygen levels in the 90s, but his heart rate would be > 170 at times. He did not have chest pain. He does not feel his SOB was worse than usual. This am, he ate a little breakfast and still felt bad. His heart rate was still high and extremely variable. He felt impending doom. He developed presyncope and was losing vision.    EMS brought him to the ER, he was having SVT en route, converted to SR with adenosine. However, even several hours after he converted, he was still hypotensive. He was given IVF and HR is 90s, with SBP 80s (supine). He no longer feels his heart pounding or rapid. He denies SOB. No recent illnesses, no fevers, no unusual cough/flu symptoms. No dysuria. No bleeding from GI/GU, source. He is not aware of any other acute medical problems.  Troponin was mildly elevated 0.28 POC.   Past Medical History  Diagnosis Date  . Coronary atherosclerosis of unspecified type of vessel, native or graft 2003  . Rheumatoid  arthritis(714.0)   . Other emphysema   . Postinflammatory pulmonary fibrosis   . Acute bronchitis   . Chronic airway obstruction, not elsewhere classified   . Anemia 2010    Iron OK, folate > 20, ferritin      Past Surgical History  Procedure Laterality Date  . Coronary angioplasty with stent placement  2003    Stent RCA  . Foot surgery      left  . Vericose vein surgery    . Aortic aneuyrsm thoracic      endovascular brabham  . Cardiac catheterization  2004    LAD OK, CFX 30%, OM2 OK, RCA stent OK, 50% at end of stent, EF 59% med rx    No Known Allergies  I have reviewed the patient's current medications Prior to Admission medications   Medication Sig Start Date End Date Taking? Authorizing Provider  ALPRAZolam Prudy Feeler) 0.5 MG tablet Take 0.5 mg by mouth 3 (three) times daily as needed for anxiety.  11/04/13  Yes Historical Provider, MD  clonazePAM (KLONOPIN) 1 MG tablet Take 1 mg by mouth 2 (two) times daily as needed. 11/04/13  Yes Historical Provider, MD  famotidine (PEPCID) 20 MG tablet Take 20 mg by mouth daily. 11/04/13  Yes Historical Provider, MD  losartan (COZAAR) 100 MG tablet Take 1 tablet by mouth daily.   Yes Historical Provider, MD  metoprolol succinate (TOPROL-XL) 25 MG 24 hr tablet Take  25 mg by mouth daily.   Yes Historical Provider, MD  mirtazapine (REMERON) 30 MG tablet Take 1 tablet by mouth at bedtime.   Yes Historical Provider, MD  oxyCODONE (OXY IR/ROXICODONE) 5 MG immediate release tablet Take 5 mg by mouth every 6 (six) hours as needed (for pain).  11/04/13  Yes Historical Provider, MD  simvastatin (ZOCOR) 40 MG tablet Take 40 mg by mouth at bedtime.     Yes Historical Provider, MD  tamsulosin (FLOMAX) 0.4 MG CAPS capsule Take 0.4 mg by mouth daily.  11/04/13  Yes Historical Provider, MD  temazepam (RESTORIL) 15 MG capsule Take 15 mg by mouth at bedtime. Take one tablet daily at bedtime for sleep 10/26/12  Yes Historical Provider, MD  zolpidem (AMBIEN) 5  MG tablet Take 5 mg by mouth at bedtime as needed. 11/04/13  Yes Historical Provider, MD     History   Social History  . Marital Status: Married    Spouse Name: N/A    Number of Children: N/A  . Years of Education: N/A   Occupational History  . Retired Management consultant    Social History Main Topics  . Smoking status: Former Smoker -- 2.00 packs/day for 40 years    Types: Cigarettes    Quit date: 01/22/1985  . Smokeless tobacco: Never Used  . Alcohol Use: Yes     Comment: rare ETOH use  . Drug Use: No  . Sexual Activity: Not on file   Other Topics Concern  . Not on file   Social History Narrative   Lives with wife and 2 dogs.    Family Status  Relation Status Death Age  . Mother Deceased late 20s    MI  . Father Deceased    Family History  Problem Relation Age of Onset  . Breast cancer Sister   . Colon cancer Father   . Pulmonary fibrosis Brother   . Heart attack Mother      ROS: No bleeding, no syncope, no strokelike symptoms.  Full 14 point review of systems complete and found to be negative unless listed above.  Physical Exam: Blood pressure 89/68, pulse 92, temperature 98.3 F (36.8 C), temperature source Rectal, resp. rate 23, height 6\' 2"  (1.88 m), weight 165 lb (74.844 kg), SpO2 97.00%.  General: Well developed, well nourished, male in no acute distress Head: Eyes PERRLA, No xanthomas.   Normocephalic and atraumatic, oropharynx without edema or exudate. Dentition: poor Lungs: rales bases Heart: HRRR S1 S2, no rub/gallop, no murmur. pulses are 2+ all 4 extrem.   Neck: No carotid bruits. No lymphadenopathy.  JVD not elevated. Abdomen: Bowel sounds present, abdomen soft and non-tender without masses or hernias noted. Msk:  No spine or cva tenderness. No weakness, no joint deformities or effusions. Extremities: No clubbing or cyanosis. No edema.  Neuro: Alert and oriented X 3. No focal deficits noted. Psych:  Good affect, responds appropriately Skin: No  rashes or lesions noted.  Labs:   Lab Results  Component Value Date   WBC 8.5 11/19/2013   HGB 10.5* 11/19/2013   HCT 34.0* 11/19/2013   MCV 98.3 11/19/2013   PLT 277 11/19/2013     Recent Labs Lab 11/19/13 1050  NA 143  K 4.9  CL 109  CO2 24  BUN 18  CREATININE 1.56*  CALCIUM 8.1*  GLUCOSE 165*   Magnesium  Date Value Ref Range Status  10/21/2013 1.8  1.5 - 2.5 mg/dL Final    Recent Labs  11/19/13 1108  TROPIPOC 0.28*   Pro B Natriuretic peptide (BNP)  Date/Time Value Ref Range Status  11/19/2013 10:50 AM 7161.0* 0 - 450 pg/mL Final  09/23/2013  5:42 PM 290.5  0 - 450 pg/mL Final   Lab Results  Component Value Date   IRON 63 10/25/2008   TIBC 241 10/25/2008   FERRITIN 320 10/03/2013    ECG: 11/19/2013 ST, 101 Sinus tachycardia Incomplete RBBB and LAFB Abnormal R-wave progression, late transition Vent. rate 101 BPM PR interval 192 ms QRS duration 103 ms QT/QTc 353/457 ms P-R-T axes 29 -36 8  ECHO:  09/26/2013 Study Conclusions - Left ventricle: The cavity size was normal. Wall thickness was normal. Systolic function was normal. The estimated ejection fraction was in the range of 55% to 60%. Wall motion was normal; there were no regional wall motion abnormalities. - Aortic valve: There was trivial regurgitation. - Right ventricle: The cavity size was moderately dilated. Systolic function was mildly reduced. - Pulmonary arteries: Systolic pressure was moderately increased. Impressions: - Technically limited study due to poor sound wave transmission.   Radiology:  Dg Chest Port 1 View 11/19/2013   CLINICAL DATA:  Shortness of breath.  Tachycardia .  EXAM: PORTABLE CHEST - 1 VIEW  COMPARISON:  10/16/2013.10/11/2013.  FINDINGS: Mediastinal structures normal. Cardiomegaly with normal pulmonary vascularity. Again noted are chronic interstitial changes consistent with chronic interstitial lung disease. Underlying pneumonitis or pulmonary interstitial edema  cannot be completely excluded . No prominent pleural effusion. No pneumothorax. No acute bony abnormality.  IMPRESSION: 1. Stable cardiomegaly.  No pulmonary venous congestion. 2. Again noted are changes of chronic interstitial lung disease. Underlying pneumonitis or pulmonary interstitial edema cannot be completely excluded.   Electronically Signed   By: Maisie Fus  Register   On: 11/19/2013 11:18    ASSESSMENT AND PLAN:   The patient was seen today by Dr Anne Fu, the patient evaluated and the data reviewed.  Principal Problem:    Hypotension - Per IM, BP improved some with IVF but still low, lactic acid elevated but no obvious source of infection. Could be hypoperfusion in the setting of tachycardia.   Active Problems:   SVT (supraventricular tachycardia) - see strips/ECG from EMS, converted with adenosine. Because of pulmonary issues, he is at high risk for atrial arrhythmias. Would add Cardizem once BP improves and follow. Recent echo w/ normal EF, will not repeat.     Coronary atherosclerosis - mild elevation in troponin possibly demand ischemia from SVT, continue CRF control, tolerating low-dose BB. With lung disease, will not try to increase. On statin, add ASA 81 mg.    Chronic obstructive pulmonary disease (COPD) - per IM. Sats are at baseline on home O2.   SignedTheodore Demark, PA-C 11/19/2013 1:52 PM Beeper [phone]  Personally seen and examined. Agree with above. -PSVT resolved with adenosine, reentrant.  -Mild troponin likely demand ischemia in the setting of SVT (no CP, no ST depression on ECG) and hypotension.  -Agree with IV Fluids.  -Add low dose diltiazem when able from BP perspective. -Will follow.  Donato Schultz, MD

## 2013-11-19 NOTE — ED Provider Notes (Signed)
CSN: [id number]     Arrival date & time 11/19/13  1032 History   First MD Initiated Contact with Patient 11/19/13 1040     Chief Complaint  Patient presents with  . Tachycardia  . Shortness of Breath     (Consider location/radiation/quality/duration/timing/severity/associated sxs/prior Treatment) HPI  Evan [name] is a 80 y.o. male who presents for evaluation of shortness of breath, blurred vision, elevated heart rate, and hypotension. The patient called in a notes this morning because he was having trouble seeing. During EMS transport, he was treated with adenosine with improvement of heart rate, and resolution of the sensation of trouble seeing. He was hypotensive at the scene, and treated with normal saline, 500 cc bolus. He arrives to the emergency department alert, conversant and feels like he is at his baseline. Yesterday, he called his pulmonologist because he was having periods of hypoxia and elevated heartbeat. He states that he has had medication changes recently, but cannot recall what they were. He is treated in the emergency department 6 days ago for a fecal impaction. He has pulmonary hypertension. He denies fever, change in cough, nausea, vomiting or problems eating. He is taking his usual medications, without relief. There are no other known modifying factors.   Past Medical History  Diagnosis Date  . Coronary atherosclerosis of unspecified type of vessel, native or graft   . Rheumatoid arthritis(714.0)   . Other emphysema   . Postinflammatory pulmonary fibrosis   . Acute bronchitis   . Chronic airway obstruction, not elsewhere classified    Past Surgical History  Procedure Laterality Date  . Stents with ami  2004  . Foot surgery      left  . Vericose vein surgery    . Aortic aneuyrsm thoracic      endovascular brabham   Family History  Problem Relation Age of Onset  . Breast cancer Sister   . Colon cancer Father   . Pulmonary fibrosis Brother    History   Substance Use Topics  . Smoking status: Former Smoker -- 2.00 packs/day for 40 years    Types: Cigarettes    Quit date: 01/22/1985  . Smokeless tobacco: Never Used  . Alcohol Use: Yes     Comment: rare ETOH use    Review of Systems  All other systems reviewed and are negative.     Allergies  Review of patient's allergies indicates no known allergies.  Home Medications   Prior to Admission medications   Medication Sig Start Date End Date Taking? Authorizing Provider  ALPRAZolam Prudy Feeler) 0.5 MG tablet Take 0.5 mg by mouth 3 (three) times daily as needed for anxiety.  11/04/13  Yes Historical Provider, MD  clonazePAM (KLONOPIN) 1 MG tablet Take 1 mg by mouth 2 (two) times daily as needed. 11/04/13  Yes Historical Provider, MD  famotidine (PEPCID) 20 MG tablet Take 20 mg by mouth daily. 11/04/13  Yes Historical Provider, MD  losartan (COZAAR) 100 MG tablet Take 1 tablet by mouth daily.   Yes Historical Provider, MD  metoprolol succinate (TOPROL-XL) 25 MG 24 hr tablet Take 25 mg by mouth daily.   Yes Historical Provider, MD  mirtazapine (REMERON) 30 MG tablet Take 1 tablet by mouth at bedtime.   Yes Historical Provider, MD  oxyCODONE (OXY IR/ROXICODONE) 5 MG immediate release tablet Take 5 mg by mouth every 6 (six) hours as needed (for pain).  11/04/13  Yes Historical Provider, MD  simvastatin (ZOCOR) 40 MG tablet Take 40 mg by  mouth at bedtime.     Yes Historical Provider, MD  tamsulosin (FLOMAX) 0.4 MG CAPS capsule Take 0.4 mg by mouth daily.  11/04/13  Yes Historical Provider, MD  temazepam (RESTORIL) 15 MG capsule Take 15 mg by mouth at bedtime. Take one tablet daily at bedtime for sleep 10/26/12  Yes Historical Provider, MD  zolpidem (AMBIEN) 5 MG tablet Take 5 mg by mouth at bedtime as needed. 11/04/13  Yes Historical Provider, MD   BP 97/54  Pulse 95  Temp(Src) 97.4 F (36.3 C) (Axillary)  Resp 23  Ht 6\' 2"  (1.88 m)  Wt 165 lb (74.844 kg)  BMI 21.18 kg/m2  SpO2  97% Physical Exam  Nursing note and vitals reviewed. Constitutional: He is oriented to person, place, and time. He appears well-developed.  Elderly, frail  HENT:  Head: Normocephalic and atraumatic.  Right Ear: External ear normal.  Left Ear: External ear normal.  Eyes: Conjunctivae and EOM are normal. Pupils are equal, round, and reactive to light.  Neck: Normal range of motion and phonation normal. Neck supple.  Cardiovascular: Normal rate, regular rhythm and normal heart sounds.   No JVD  Pulmonary/Chest: Effort normal. He exhibits no bony tenderness.  Rales bilateral bases  Abdominal: Soft. There is no tenderness.  Musculoskeletal: Normal range of motion. He exhibits no edema and no tenderness.  Neurological: He is alert and oriented to person, place, and time. No cranial nerve deficit or sensory deficit. He exhibits normal muscle tone. Coordination normal.  Skin: Skin is warm, dry and intact.  Psychiatric: He has a normal mood and affect. His behavior is normal. Judgment and thought content normal.    ED Course  Procedures (including critical care time)  Hypotension treated with IV fluid bolus  Medications - No data to display  Patient Vitals for the past 24 hrs:  BP Temp Temp src Pulse Resp SpO2 Height Weight  11/19/13 1145 82/52 mmHg - - 92 19 99 % - -  11/19/13 1115 97/54 mmHg - - 95 23 97 % - -  11/19/13 1100 83/56 mmHg - - 97 18 98 % - -  11/19/13 1043 82/56 mmHg 97.4 F (36.3 C) Axillary 100 22 93 % 6\' 2"  (1.88 m) 165 lb (74.844 kg)    14:05 Reevaluation with update and discussion. After initial assessment and treatment, an updated evaluation reveals he is comfortable. Abdomen is soft and NTTP. Findings discussed with pt., all questions answered. Patrici Minnis L   11:50- consultation to cardiology for evaluation of NSTEMI with episode of SVT. Dr. Anne Fu will follow the patient as a consultant, and recommends hospitalist admission.  2:10 PM-Consult complete with  Dr. Cena Benton. Patient case explained and discussed. He agrees to admit patient for further evaluation and treatment. Call ended at 14:15  CRITICAL CARE Performed by: Flint Melter Total critical care time: 45 min Critical care time was exclusive of separately billable procedures and treating other patients. Critical care was necessary to treat or prevent imminent or life-threatening deterioration. Critical care was time spent personally by me on the following activities: development of treatment plan with patient and/or surrogate as well as nursing, discussions with consultants, evaluation of patient's response to treatment, examination of patient, obtaining history from patient or surrogate, ordering and performing treatments and interventions, ordering and review of laboratory studies, ordering and review of radiographic studies, pulse oximetry and re-evaluation of patient's condition.   Labs Review Labs Reviewed  CBC - Abnormal; Notable for the following:    RBC 3.46 (*)  Hemoglobin 10.5 (*)    HCT 34.0 (*)    RDW 16.8 (*)    All other components within normal limits  BASIC METABOLIC PANEL - Abnormal; Notable for the following:    Glucose, Bld 165 (*)    Creatinine, Ser 1.56 (*)    Calcium 8.1 (*)    GFR calc non Af Amer 40 (*)    GFR calc Af Amer 47 (*)    All other components within normal limits  PRO B NATRIURETIC PEPTIDE - Abnormal; Notable for the following:    Pro B Natriuretic peptide (BNP) 7161.0 (*)    All other components within normal limits  I-STAT TROPOININ, ED - Abnormal; Notable for the following:    Troponin i, poc 0.28 (*)    All other components within normal limits  I-STAT CG4 LACTIC ACID, ED - Abnormal; Notable for the following:    Lactic Acid, Venous 2.35 (*)    All other components within normal limits    Imaging Review Dg Chest Port 1 View  11/19/2013   CLINICAL DATA:  Shortness of breath.  Tachycardia .  EXAM: PORTABLE CHEST - 1 VIEW  COMPARISON:   10/16/2013.10/11/2013.  FINDINGS: Mediastinal structures normal. Cardiomegaly with normal pulmonary vascularity. Again noted are chronic interstitial changes consistent with chronic interstitial lung disease. Underlying pneumonitis or pulmonary interstitial edema cannot be completely excluded . No prominent pleural effusion. No pneumothorax. No acute bony abnormality.  IMPRESSION: 1. Stable cardiomegaly.  No pulmonary venous congestion. 2. Again noted are changes of chronic interstitial lung disease. Underlying pneumonitis or pulmonary interstitial edema cannot be completely excluded.   Electronically Signed   By: Maisie Fus  Register   On: 11/19/2013 11:18     EKG Interpretation   Date/Time:  Thursday November 19 2013 10:41:09 EDT Ventricular Rate:  101 PR Interval:  192 QRS Duration: 103 QT Interval:  353 QTC Calculation: 457 R Axis:   -36 Text Interpretation:  Sinus tachycardia Incomplete RBBB and LAFB Abnormal  R-wave progression, late transition since last tracing no significant  change Confirmed by Effie Shy  MD, Danija Gosa ([id number]) on 11/19/2013 10:44:19 AM      MDM   Final diagnoses:  Other specified hypotension  SVT (supraventricular tachycardia)  NSTEMI (non-ST elevated myocardial infarction)       Tachycardia syndrome, with SVT. Troponin elevation consistent with non-ST segment elevation MI, which probably is troponin leak associated with tachycardia. Hypotension is nonspecific. BNP is elevated without edema on chest x-ray. He remains hypotensive in the emergency department and will require admission for monitoring, enzymes, cardiology evaluation and further care as indicated.  Nursing Notes Reviewed/ Care Coordinated, and agree without changes. Applicable Imaging Reviewed.  Interpretation of Laboratory Data incorporated into ED treatment  Name: Admit  Flint Melter, MD 11/19/13 1721

## 2013-11-19 NOTE — ED Notes (Signed)
Admitting Physician at the bedside.  

## 2013-11-19 NOTE — ED Notes (Signed)
Lactic acid results given to Dr Wentz 

## 2013-11-19 NOTE — ED Notes (Signed)
Attempted report x 2 

## 2013-11-19 NOTE — ED Notes (Signed)
Attempted report x1. 

## 2013-11-19 NOTE — Telephone Encounter (Signed)
He needs fu appt; likely at a new low baseline. Give him fu to see TP < 5 days  Dr. Kalman Shan, M.D., [facility].C.P Pulmonary and Critical Care Medicine Staff Physician [facility] Pager: [phone], If no answer or between  15:00h - 7:00h: call [phone]  11/19/2013 7:01 AM

## 2013-11-19 NOTE — ED Notes (Signed)
Cardiologist at the bedside

## 2013-11-20 ENCOUNTER — Encounter (HOSPITAL_COMMUNITY): Payer: Self-pay | Admitting: General Practice

## 2013-11-20 DIAGNOSIS — I2510 Atherosclerotic heart disease of native coronary artery without angina pectoris: Secondary | ICD-10-CM

## 2013-11-20 DIAGNOSIS — I959 Hypotension, unspecified: Secondary | ICD-10-CM

## 2013-11-20 HISTORY — DX: Hypotension, unspecified: I95.9

## 2013-11-20 LAB — BASIC METABOLIC PANEL
ANION GAP: 9 (ref 5–15)
BUN: 15 mg/dL (ref 6–23)
CO2: 23 meq/L (ref 19–32)
CREATININE: 1.41 mg/dL — AB (ref 0.50–1.35)
Calcium: 7.5 mg/dL — ABNORMAL LOW (ref 8.4–10.5)
Chloride: 112 mEq/L (ref 96–112)
GFR calc Af Amer: 53 mL/min — ABNORMAL LOW (ref >90)
GFR calc non Af Amer: 45 mL/min — ABNORMAL LOW (ref >90)
Glucose, Bld: 94 mg/dL (ref 70–99)
Potassium: 4.4 mEq/L (ref 3.7–5.3)
SODIUM: 144 meq/L (ref 137–147)

## 2013-11-20 LAB — CBC
HEMATOCRIT: 30.3 % — AB (ref 39.0–52.0)
HEMOGLOBIN: 9.7 g/dL — AB (ref 13.0–17.0)
MCH: 31.3 pg (ref 26.0–34.0)
MCHC: 32 g/dL (ref 30.0–36.0)
MCV: 97.7 fL (ref 78.0–100.0)
Platelets: 253 10*3/uL (ref 150–400)
RBC: 3.1 MIL/uL — ABNORMAL LOW (ref 4.22–5.81)
RDW: 16.9 % — AB (ref 11.5–15.5)
WBC: 7 10*3/uL (ref 4.0–10.5)

## 2013-11-20 LAB — TROPONIN I

## 2013-11-20 MED ORDER — ATORVASTATIN CALCIUM 20 MG PO TABS
20.0000 mg | ORAL_TABLET | Freq: Every day | ORAL | Status: DC
  Administered 2013-11-20: 20 mg via ORAL
  Filled 2013-11-20 (×2): qty 1

## 2013-11-20 MED ORDER — DILTIAZEM HCL 30 MG PO TABS
30.0000 mg | ORAL_TABLET | Freq: Four times a day (QID) | ORAL | Status: DC
  Administered 2013-11-20 – 2013-11-21 (×5): 30 mg via ORAL
  Filled 2013-11-20 (×8): qty 1

## 2013-11-20 MED ORDER — ENSURE COMPLETE PO LIQD
237.0000 mL | Freq: Three times a day (TID) | ORAL | Status: DC
  Administered 2013-11-20 – 2013-11-21 (×3): 237 mL via ORAL

## 2013-11-20 MED ORDER — INFLUENZA VAC SPLIT QUAD 0.5 ML IM SUSY
0.5000 mL | PREFILLED_SYRINGE | INTRAMUSCULAR | Status: AC
  Administered 2013-11-21: 0.5 mL via INTRAMUSCULAR
  Filled 2013-11-20: qty 0.5

## 2013-11-20 MED ORDER — CETYLPYRIDINIUM CHLORIDE 0.05 % MT LIQD
7.0000 mL | Freq: Two times a day (BID) | OROMUCOSAL | Status: DC
  Administered 2013-11-21: 7 mL via OROMUCOSAL

## 2013-11-20 NOTE — Care Management Note (Signed)
    Page 1 of 2   11/21/2013     11:25:00 AM CARE MANAGEMENT NOTE 11/21/2013  Patient:  Evan [name], Evan [name]   Account Number:  [id number]  Date Initiated:  11/20/2013  Documentation initiated by:  AMERSON,JULIE  Subjective/Objective Assessment:   Pt adm on 11/19/13 with SVT, pos troponins.  PTA, pt resided at home with spouse.     Action/Plan:   Will follow for dc needs as pt progresses.   Anticipated DC Date:  11/22/2013   Anticipated DC Plan:  HOME W HOME HEALTH SERVICES      DC Planning Services  CM consult      [facility] Choice  HOME HEALTH   Choice offered to / List presented to:  C-1 Patient   DME arranged  [facility]      DME agency  [facility]     HH arranged  HH-1 RN  HH-2 PT      [facility] agency  [facility]   Status of service:  Completed, signed off Medicare Important Message given?   (If response is "NO", the following Medicare IM given date fields will be blank) Date Medicare IM given:   Medicare IM given by:   Date Additional Medicare IM given:   Additional Medicare IM given by:    Discharge Disposition:    Per UR Regulation:  Reviewed for med. necessity/level of care/duration of stay  If discussed at Long Length of Stay Meetings, dates discussed:    Comments:  10/3/115 11:20 CM spoke with pt who has oxygen through [facility]. CM called [facility]  DME rep, Germaine to bring a tank for transport home.  Pt chooses [facility] to render HHPT/RN. Address and contact information verified with pt.  Referral called to [facility] rep, Lecretia.  No other CMneeds were communicated. Freddy Jaksch, BSN, CM [phone].

## 2013-11-20 NOTE — Progress Notes (Signed)
[facility] PROGRESS NOTE  Evan [name] AJO:[id number] DOB: 03/24/1931 DOA: 11/19/2013 PCP: Katy Apo, MD  Assessment/Plan: Hypotension: resolved.   Active Problems:  Coronary atherosclerosis  - Stable, continue aspirin and statin  - no chest pain reported.  Chronic obstructive pulmonary disease (COPD)  - stable, no wheezes  SVT (supraventricular tachycardia)  - Cardiology assisting with management  On cardizem. Demand ischemia  - Cardiology on board  - agree with troponins q 6 hours x 3   Code Status: full code.  Family Communication: none a bedside Disposition Plan: pending.    Consultants:  cardiology  Procedures:  none  Antibiotics:  none  HPI/Subjective: No new complaints. Wants to know when he can go home  Objective: Filed Vitals:   11/20/13 1143  BP: 109/60  Pulse: 96  Temp:   Resp: 20    Intake/Output Summary (Last 24 hours) at 11/20/13 1504 Last data filed at 11/20/13 0900  Gross per 24 hour  Intake    240 ml  Output    750 ml  Net   -510 ml   Filed Weights   11/19/13 1043  Weight: 74.844 kg (165 lb)    Exam:   General:  Alert afebrile comfortable  Cardiovascular: s1s2  Respiratory: ctab  Abdomen: soft NT NDBS+  Musculoskeletal: no pedal edema.   Data Reviewed: Basic Metabolic Panel  Recent Labs Lab 11/19/13 1050 11/19/13 1932 11/20/13 0440  NA 143  --  144  K 4.9  --  4.4  CL 109  --  112  CO2 24  --  23  GLUCOSE 165*  --  94  BUN 18  --  15  CREATININE 1.56* 1.49* 1.41*  CALCIUM 8.1*  --  7.5*  MG  --  1.8  --   PHOS  --  2.5  --    Liver Function Tests: No results found for this basename: AST, ALT, ALKPHOS, BILITOT, PROT, ALBUMIN,  in the last 168 hours No results found for this basename: LIPASE, AMYLASE,  in the last 168 hours No results found for this basename: AMMONIA,  in the last 168 hours CBC:  Recent Labs Lab 11/19/13 1050 11/19/13 1932 11/20/13 0440  WBC 8.5 7.7 7.0  HGB 10.5*  10.3* 9.7*  HCT 34.0* 33.0* 30.3*  MCV 98.3 97.1 97.7  PLT 277 273 253   Cardiac Enzymes:  Recent Labs Lab 11/19/13 1320 11/19/13 1642 11/19/13 2242 11/20/13 0440  CKTOTAL 35  --   --   --   CKMB 3.8  --   --   --   TROPONINI  --  0.51* 0.31* <0.30   BNP (last 3 results)  Recent Labs  09/23/13 1742 11/19/13 1050  PROBNP 290.5 7161.0*   CBG: No results found for this basename: GLUCAP,  in the last 168 hours  No results found for this or any previous visit (from the past 240 hour(s)).   Studies: Dg Chest Port 1 View  11/19/2013   CLINICAL DATA:  Shortness of breath.  Tachycardia .  EXAM: PORTABLE CHEST - 1 VIEW  COMPARISON:  10/16/2013.10/11/2013.  FINDINGS: Mediastinal structures normal. Cardiomegaly with normal pulmonary vascularity. Again noted are chronic interstitial changes consistent with chronic interstitial lung disease. Underlying pneumonitis or pulmonary interstitial edema cannot be completely excluded . No prominent pleural effusion. No pneumothorax. No acute bony abnormality.  IMPRESSION: 1. Stable cardiomegaly.  No pulmonary venous congestion. 2. Again noted are changes of chronic interstitial lung disease. Underlying pneumonitis or pulmonary interstitial  edema cannot be completely excluded.   Electronically Signed   By: Maisie Fus  Register   On: 11/19/2013 11:18    Scheduled Meds: . antiseptic oral rinse  7 mL Mouth Rinse BID  . aspirin  81 mg Oral Daily  . atorvastatin  20 mg Oral QHS  . diltiazem  30 mg Oral Q6H  . famotidine  20 mg Oral Daily  . feeding supplement (ENSURE COMPLETE)  237 mL Oral TID BM  . heparin  5,000 Units Subcutaneous 3 times per day  . [START ON 11/21/2013] Influenza vac split quadrivalent PF  0.5 mL Intramuscular Tomorrow-1000  . mirtazapine  30 mg Oral QHS  . sodium chloride  3 mL Intravenous Q12H  . tamsulosin  0.4 mg Oral Daily   Continuous Infusions: . sodium chloride 75 mL/hr at 11/19/13 1918    Principal Problem:    Hypotension Active Problems:   Coronary atherosclerosis   Chronic obstructive pulmonary disease (COPD)   SVT (supraventricular tachycardia)   Demand ischemia    Time spent: 30 minuets.     [facility]  [facility] Pager [phone]. If 7PM-7AM, please contact night-coverage at [email or web address], password [facility] 11/20/2013, 3:04 PM  LOS: 1 day

## 2013-11-20 NOTE — Progress Notes (Signed)
INITIAL NUTRITION ASSESSMENT  DOCUMENTATION CODES Per approved criteria  -Severe malnutrition in the context of chronic illness   INTERVENTION: Ensure Complete po TID, each supplement provides 350 kcal and 13 grams of protein  NUTRITION DIAGNOSIS: Malnutrition related to chronic illness as evidenced by 11% weight loss x 2 months and severe muscle depleiton.   Goal: Pt to meet >/= 90% of their estimated nutrition needs   Monitor:  PO intake, supplement acceptance, weight trends  Reason for Assessment: Pt identified as at nutrition risk on the Malnutrition Screen Tool  80 y.o. male  Admitting Dx: Hypotension  ASSESSMENT: Pt with hx of COPD admitted with chest pain found to be in SVT which has now resolved.  Per pt he has had not appetite. Pt just recently started drinking ensure 2-3 per day. Pt states that his wife is a meat and potatoes person and he is not but eat what she prepares. Breakfast: bagel or 2 pieces of toast, Lunch: nothing, Dinner: meat and potato and vegetable.   Nutrition Focused Physical Exam:  Subcutaneous Fat:  Orbital Region: WDL Upper Arm Region: mild depletion  Thoracic and Lumbar Region: WDL  Muscle:  Temple Region: severe depletion  Clavicle Bone Region: severe depletion  Clavicle and Acromion Bone Region: severe depletion Scapular Bone Region: severe depletion Dorsal Hand: severe depletion  Patellar Region: severe depletion  Anterior Thigh Region: severe depletion  Posterior Calf Region: mild/moderate depletion   Edema: not present   Height: Ht Readings from Last 1 Encounters:  11/19/13 6\' 2"  (1.88 m)    Weight: Wt Readings from Last 1 Encounters:  11/19/13 165 lb (74.844 kg)    Ideal Body Weight: 86.3 kg   % Ideal Body Weight: 87%  Wt Readings from Last 10 Encounters:  11/19/13 165 lb (74.844 kg)  11/13/13 165 lb (74.844 kg)  09/23/13 175 lb 8 oz (79.606 kg)  09/07/13 186 lb (84.369 kg)  06/12/13 186 lb 3.2 oz (84.46 kg)   04/08/13 186 lb (84.369 kg)  03/18/13 179 lb 6.4 oz (81.375 kg)  03/03/13 188 lb 9.6 oz (85.548 kg)  01/13/13 185 lb (83.915 kg)  01/06/13 190 lb (86.183 kg)    Usual Body Weight: 186 lb   % Usual Body Weight: 89%  BMI:  Body mass index is 21.18 kg/(m^2).  Estimated Nutritional Needs: Kcal: 1800-2000 Protein: 90-110 grams Fluid: > 1.8 L/day  Skin: excoriated abdomen  Diet Order: Cardiac Meal Completion: 100%  EDUCATION NEEDS: -No education needs identified at this time   Intake/Output Summary (Last 24 hours) at 11/20/13 1122 Last data filed at 11/20/13 0900  Gross per 24 hour  Intake    240 ml  Output    750 ml  Net   -510 ml    Last BM: PTA   Labs:   Recent Labs Lab 11/19/13 1050 11/19/13 1932 11/20/13 0440  NA 143  --  144  K 4.9  --  4.4  CL 109  --  112  CO2 24  --  23  BUN 18  --  15  CREATININE 1.56* 1.49* 1.41*  CALCIUM 8.1*  --  7.5*  MG  --  1.8  --   PHOS  --  2.5  --   GLUCOSE 165*  --  94    CBG (last 3)  No results found for this basename: GLUCAP,  in the last 72 hours  Scheduled Meds: . aspirin  81 mg Oral Daily  . atorvastatin  20 mg Oral  QHS  . diltiazem  30 mg Oral Q6H  . famotidine  20 mg Oral Daily  . heparin  5,000 Units Subcutaneous 3 times per day  . [START ON 11/21/2013] Influenza vac split quadrivalent PF  0.5 mL Intramuscular Tomorrow-1000  . mirtazapine  30 mg Oral QHS  . sodium chloride  3 mL Intravenous Q12H  . tamsulosin  0.4 mg Oral Daily    Continuous Infusions: . sodium chloride 75 mL/hr at 11/19/13 1918    Past Medical History  Diagnosis Date  . Coronary atherosclerosis of unspecified type of vessel, native or graft 2003  . Rheumatoid arthritis(714.0)   . Other emphysema   . Postinflammatory pulmonary fibrosis   . Acute bronchitis   . Chronic airway obstruction, not elsewhere classified   . Anemia 2010    Iron OK, folate > 20, ferritin   . Hypotension 11/20/2013    Past Surgical History   Procedure Laterality Date  . Coronary angioplasty with stent placement  2003    Stent RCA  . Foot surgery      left  . Vericose vein surgery    . Aortic aneuyrsm thoracic      endovascular br[place]ham  . Cardiac catheterization  2004    LAD OK, CFX 30%, OM2 OK, RCA stent OK, 50% at end of stent, EF 59% med rx    Kendell Bane RD, LDN, CNSC [phone] Pager [phone] After Hours Pager

## 2013-11-20 NOTE — Evaluation (Signed)
Physical Therapy Evaluation Patient Details Name: Evan [name] MRN: [id number] DOB: Sep 21, 1931 Today's Date: 11/20/2013   History of Present Illness  Evan [name] is a 80 y.o. male  With history of MI several years ago, hyperlipidemia, and hypertension. Who presented to the ED after developing sudden onset palpitations which were associated subsequently with difficulty seeing.  Clinical Impression  Patient presents with severe hypoxia after ambulating on 6L O2 and reports this is better than his baseline.  Also presents with limitations noted in PT problem list.  Feel will benefit from skilled PT in the acute setting as well as follow up HHPT for addressing issues.    Follow Up Recommendations Home health PT    Equipment Recommendations  None recommended by PT    Recommendations for Other Services       Precautions / Restrictions Precautions Precautions: Fall Precaution Comments: oxygen dependent; hypoxic amb on 6L      Mobility  Bed Mobility Overal bed mobility: Modified Independent                Transfers Overall transfer level: Needs assistance Equipment used: Rolling walker (2 wheeled) Transfers: Sit to/from Stand Sit to Stand: Min assist         General transfer comment: struggles to stand from bed after sitting (though was standing when I entered room after leaving to get O2 and RW,) needed min A and then sat on bed uncontrolled  Ambulation/Gait Ambulation/Gait assistance: Min guard Ambulation Distance (Feet): 125 Feet Assistive device: Rolling walker (2 wheeled) Gait Pattern/deviations: Step-through pattern;Shuffle;Trunk flexed     General Gait Details: stopped to stand taller and breathe x 2; increased difficulty turning  Stairs            Wheelchair Mobility    Modified Rankin (Stroke Patients Only)       Balance Overall balance assessment: Needs assistance           Standing balance-Leahy Scale: Poor Standing balance  comment: UE assist needed for balance                              Pertinent Vitals/Pain Pain Assessment: No/denies pain    Home Living Family/patient expects to be discharged to:: Private residence Living Arrangements: Spouse/significant other Available Help at Discharge: Family Type of Home: House Home Access: Stairs to enter Entrance Stairs-Rails: None Entrance Stairs-Number of Steps: 2 Home Layout: One level Home Equipment: Environmental consultant - 2 wheels;Shower seat;Bedside commode;Grab bars - tub/shower      Prior Function Level of Independence: Independent with assistive device(s)               Hand Dominance        Extremity/Trunk Assessment               Lower Extremity Assessment: Generalized weakness      Cervical / Trunk Assessment: Kyphotic  Communication   Communication: No difficulties  Cognition Arousal/Alertness: Awake/alert Behavior During Therapy: Anxious Overall Cognitive Status: Within Functional Limits for tasks assessed                      General Comments      Exercises        Assessment/Plan    PT Assessment Patient needs continued PT services  PT Diagnosis Difficulty walking;Generalized weakness   PT Problem List Decreased strength;Decreased activity tolerance;Decreased balance;Decreased mobility;Decreased safety awareness;Cardiopulmonary status limiting activity  PT Treatment Interventions  DME instruction;Therapeutic exercise;Gait training;Balance training;Stair training;Functional mobility training;Therapeutic activities;Patient/family education   PT Goals (Current goals can be found in the Care Plan section) Acute Rehab PT Goals Patient Stated Goal: To go home tomorrow PT Goal Formulation: With patient Time For Goal Achievement: 11/27/13 Potential to Achieve Goals: Good    Frequency Min 3X/week   Barriers to discharge        Co-evaluation               End of Session Equipment Utilized During  Treatment: Gait belt Activity Tolerance: Patient limited by fatigue;Treatment limited secondary to medical complications (Comment) Patient left: in bed;with call bell/phone within reach;Other (comment) (MD in room)      Functional Assessment Tool Used: Clinical observation Functional Limitation: Mobility: Walking and moving around Mobility: Walking and Moving Around Current Status [phone]): At least 40 percent but less than 60 percent impaired, limited or restricted Mobility: Walking and Moving Around Goal Status [phone]): At least 40 percent but less than 60 percent impaired, limited or restricted    Time: 1550-1614 PT Time Calculation (min): 24 min   Charges:   PT Evaluation $Initial PT Evaluation Tier I: 1 Procedure PT Treatments $Gait Training: 8-22 mins   PT G Codes:   Functional Assessment Tool Used: Clinical observation Functional Limitation: Mobility: Walking and moving around    [facility] 11/20/2013, 4:26 PM [place], [place] [phone] 11/20/2013

## 2013-11-20 NOTE — Progress Notes (Signed)
SUBJECTIVE:  No complaints of chest pain or SOB.  No further palpitations  OBJECTIVE:   Vitals:   Filed Vitals:   11/19/13 1617 11/19/13 1634 11/19/13 1945 11/20/13 0540  BP: 95/58 123/76 104/66 121/69  Pulse: 91 99 92 84  Temp: 98.7 F (37.1 C) 98.7 F (37.1 C) 97.7 F (36.5 C) 97.5 F (36.4 C)  TempSrc: Oral Oral Oral Oral  Resp: 21 22 16 18   Height:      Weight:      SpO2: 96% 96% 98% 98%   I&O's:   Intake/Output Summary (Last 24 hours) at 11/20/13 0735 Last data filed at 11/20/13 0400  Gross per 24 hour  Intake      0 ml  Output    450 ml  Net   -450 ml   TELEMETRY: Reviewed telemetry pt in NSR with 1 short run of SVT early this am around 1am     PHYSICAL EXAM General: Well developed, well nourished, in no acute distress Head: Eyes PERRLA, No xanthomas.   Normal cephalic and atramatic  Lungs:   Clear bilaterally to auscultation and percussion. Heart:   HRRR S1 S2 Pulses are 2+ & equal. Abdomen: Bowel sounds are positive, abdomen soft and non-tender without masses Extremities:   No clubbing, cyanosis or edema.  DP +1 Neuro: Alert and oriented X 3. Psych:  Good affect, responds appropriately   LABS: Basic Metabolic Panel:  Recent Labs  11/22/13 1050 11/19/13 1932 11/20/13 0440  NA 143  --  144  K 4.9  --  4.4  CL 109  --  112  CO2 24  --  23  GLUCOSE 165*  --  94  BUN 18  --  15  CREATININE 1.56* 1.49* 1.41*  CALCIUM 8.1*  --  7.5*  MG  --  1.8  --   PHOS  --  2.5  --    Liver Function Tests: No results found for this basename: AST, ALT, ALKPHOS, BILITOT, PROT, ALBUMIN,  in the last 72 hours No results found for this basename: LIPASE, AMYLASE,  in the last 72 hours CBC:  Recent Labs  11/19/13 1932 11/20/13 0440  WBC 7.7 7.0  HGB 10.3* 9.7*  HCT 33.0* 30.3*  MCV 97.1 97.7  PLT 273 253   Cardiac Enzymes:  Recent Labs  11/19/13 1320 11/19/13 1642 11/19/13 2242 11/20/13 0440  CKTOTAL 35  --   --   --   CKMB 3.8  --   --   --     TROPONINI  --  0.51* 0.31* <0.30   BNP: No components found with this basename: POCBNP,  D-Dimer: No results found for this basename: DDIMER,  in the last 72 hours Hemoglobin A1C: No results found for this basename: HGBA1C,  in the last 72 hours Fasting Lipid Panel: No results found for this basename: CHOL, HDL, LDLCALC, TRIG, CHOLHDL, LDLDIRECT,  in the last 72 hours Thyroid Function Tests:  Recent Labs  11/19/13 1932  TSH 3.000   Anemia Panel: No results found for this basename: VITAMINB12, FOLATE, FERRITIN, TIBC, IRON, RETICCTPCT,  in the last 72 hours Coag Panel:   Lab Results  Component Value Date   INR 1.21 10/03/2013   INR 1.28 09/23/2013   INR 1.23 06/02/2010   PROTIME 12.0 10/25/2008    RADIOLOGY: Dg Chest Port 1 View  11/19/2013   CLINICAL DATA:  Shortness of breath.  Tachycardia .  EXAM: PORTABLE CHEST - 1 VIEW  COMPARISON:  10/16/2013.10/11/2013.  FINDINGS: Mediastinal structures normal. Cardiomegaly with normal pulmonary vascularity. Again noted are chronic interstitial changes consistent with chronic interstitial lung disease. Underlying pneumonitis or pulmonary interstitial edema cannot be completely excluded . No prominent pleural effusion. No pneumothorax. No acute bony abnormality.  IMPRESSION: 1. Stable cardiomegaly.  No pulmonary venous congestion. 2. Again noted are changes of chronic interstitial lung disease. Underlying pneumonitis or pulmonary interstitial edema cannot be completely excluded.   Electronically Signed   By: Maisie Fus  Register   On: 11/19/2013 11:18    ASSESSMENT AND PLAN:Evan [name] is a 80 y.o. male with a history of CAD. He has a stent to the RCA, probably in 2003. He has not seen cardiology since then. He has been stressed due to the death of his brother about 3 weeks ago. He was admitted with palpitations and hypotension and found to be in SVT which terminated with Adenosine.  Principal Problem:  Hypotension - Per IM, BP improved  with  IVF but still low, lactic acid elevated but no obvious source of infection. Could be hypoperfusion in the setting of tachycardia. Bp much improved this am. Active Problems:  SVT (supraventricular tachycardia) - see strips/ECG from EMS, converted with adenosine. Because of pulmonary issues, he is at high risk for atrial arrhythmias. Cardizem 30mg  q8 hours added last PM and tolerating well. He had another short burst of SVT this am around 1am so will increase Cardizem to q 6 hours. Recent echo w/ normal EF, will not repeat.  Coronary atherosclerosis - mild elevation in troponin consistent with demand ischemia from SVT.  His troponin is now back to normal.  Will continue CRF control, tolerating low-dose BB. With lung disease, will not try to increase. On statin, add ASA 81 mg.  Chronic obstructive pulmonary disease (COPD) - per IM. Sats are at baseline on home O2.    , MD  11/20/2013  7:35 AM

## 2013-11-21 DIAGNOSIS — I471 Supraventricular tachycardia: Secondary | ICD-10-CM | POA: Diagnosis not present

## 2013-11-21 MED ORDER — ENSURE COMPLETE PO LIQD: 237.0000 mL | Freq: Three times a day (TID) | ORAL | Status: AC

## 2013-11-21 MED ORDER — FUROSEMIDE 20 MG PO TABS
20.0000 mg | ORAL_TABLET | Freq: Once | ORAL | Status: AC
  Administered 2013-11-21: 20 mg via ORAL
  Filled 2013-11-21: qty 1

## 2013-11-21 MED ORDER — DILTIAZEM HCL ER COATED BEADS 120 MG PO CP24: 120.0000 mg | ORAL_CAPSULE | Freq: Every day | ORAL | Status: DC

## 2013-11-21 MED ORDER — ATORVASTATIN CALCIUM 20 MG PO TABS: 20.0000 mg | ORAL_TABLET | Freq: Every day | ORAL | Status: AC

## 2013-11-21 MED ORDER — ASPIRIN 81 MG PO CHEW: 81.0000 mg | CHEWABLE_TABLET | Freq: Every day | ORAL | Status: AC

## 2013-11-21 NOTE — Progress Notes (Signed)
Discharge home. Home discharge instructions given to wife, HH needs addressed to CM. Portable oxygen provided by [facility]. Patient is alert and oriented X 4 , not in any distress, O2 at 4LPM,

## 2013-11-21 NOTE — Progress Notes (Signed)
Subjective:  Feels back to normal. Very eager to go home.  Objective:  Temp:  [98.4 F (36.9 C)-98.9 F (37.2 C)] 98.9 F (37.2 C) (10/31 0529) Pulse Rate:  [81-129] 90 (10/31 0529) Resp:  [18-20] 20 (10/31 0529) BP: (109-148)/(60-86) 133/86 mmHg (10/31 0529) SpO2:  [73 %-100 %] 99 % (10/31 0529) FiO2 (%):  [4 %] 4 % (10/30 2018) Weight change:   Intake/Output from previous day: 10/30 0701 - 10/31 0700 In: 1479 [P.O.:600] Out: 750 [Urine:750]  Intake/Output from this shift: Total I/O In: 240 [P.O.:240] Out: -   Medications: Current Facility-Administered Medications  Medication Dose Route Frequency Provider Last Rate Last Dose  . 0.9 %  sodium chloride infusion   Intravenous Continuous Velvet Bathe, MD 75 mL/hr at 11/20/13 2209    . ALPRAZolam Duanne Moron) tablet 0.5 mg  0.5 mg Oral TID PRN Velvet Bathe, MD   0.5 mg at 11/20/13 1539  . antiseptic oral rinse (CPC / CETYLPYRIDINIUM CHLORIDE 0.05%) solution 7 mL  7 mL Mouth Rinse BID Hosie Poisson, MD      . aspirin chewable tablet 81 mg  81 mg Oral Daily Rhonda G Barrett, PA-C   81 mg at 11/20/13 1042  . atorvastatin (LIPITOR) tablet 20 mg  20 mg Oral QHS Velvet Bathe, MD   20 mg at 11/20/13 2208  . diltiazem (CARDIZEM) tablet 30 mg  30 mg Oral Q6H Sueanne Margarita, MD   30 mg at 11/21/13 0550  . famotidine (PEPCID) tablet 20 mg  20 mg Oral Daily Velvet Bathe, MD   20 mg at 11/20/13 1042  . feeding supplement (ENSURE COMPLETE) (ENSURE COMPLETE) liquid 237 mL  237 mL Oral TID BM Heather Cornelison Pitts, RD   237 mL at 11/20/13 2000  . heparin injection 5,000 Units  5,000 Units Subcutaneous 3 times per day Velvet Bathe, MD   5,000 Units at 11/21/13 0551  . Influenza vac split quadrivalent PF (FLUARIX) injection 0.5 mL  0.5 mL Intramuscular Tomorrow-1000 Hosie Poisson, MD      . mirtazapine (REMERON) tablet 30 mg  30 mg Oral QHS Velvet Bathe, MD   30 mg at 11/20/13 2208  . oxyCODONE (Oxy IR/ROXICODONE) immediate release tablet 5 mg  5  mg Oral Q6H PRN Velvet Bathe, MD   5 mg at 11/21/13 0734  . sodium chloride 0.9 % injection 3 mL  3 mL Intravenous Q12H Velvet Bathe, MD      . tamsulosin (FLOMAX) capsule 0.4 mg  0.4 mg Oral Daily Velvet Bathe, MD   0.4 mg at 11/20/13 1042    Physical Exam:  General: Alert, oriented x3, no distress Head: no evidence of trauma, PERRL, EOMI, no exophtalmos or lid lag, no myxedema, no xanthelasma; normal ears, nose and oropharynx Neck: normal jugular venous pulsations and no hepatojugular reflux; brisk carotid pulses without delay and no carotid bruits Chest: emphysematous,  clear to auscultation, no signs of consolidation by percussion or palpation, normal fremitus, symmetrical and full respiratory excursions Cardiovascular: normal position and quality of the apical impulse, regular rhythm, normal first and second heart sounds, no murmurs, rubs or gallops Abdomen: no tenderness or distention, no masses by palpation, no abnormal pulsatility or arterial bruits, normal bowel sounds, no hepatosplenomegaly Extremities: no clubbing, cyanosis or edema; 2+ radial, ulnar and brachial pulses bilaterally; 2+ right femoral, posterior tibial and dorsalis pedis pulses; 2+ left femoral, posterior tibial and dorsalis pedis pulses; no subclavian or femoral bruits Neurological: grossly nonfocal   Lab Results:  Results for orders placed during the hospital encounter of 11/19/13 (from the past 48 hour(s))  CBC     Status: Abnormal   Collection Time    11/19/13 10:50 AM      Result Value Ref Range   WBC 8.5  4.0 - 10.5 K/uL   RBC 3.46 (*) 4.22 - 5.81 MIL/uL   Hemoglobin 10.5 (*) 13.0 - 17.0 g/dL   HCT 34.0 (*) 39.0 - 52.0 %   MCV 98.3  78.0 - 100.0 fL   MCH 30.3  26.0 - 34.0 pg   MCHC 30.9  30.0 - 36.0 g/dL   RDW 16.8 (*) 11.5 - 15.5 %   Platelets 277  150 - 400 K/uL  BASIC METABOLIC PANEL     Status: Abnormal   Collection Time    11/19/13 10:50 AM      Result Value Ref Range   Sodium 143  137 - 147  mEq/L   Potassium 4.9  3.7 - 5.3 mEq/L   Chloride 109  96 - 112 mEq/L   CO2 24  19 - 32 mEq/L   Glucose, Bld 165 (*) 70 - 99 mg/dL   BUN 18  6 - 23 mg/dL   Creatinine, Ser 1.56 (*) 0.50 - 1.35 mg/dL   Calcium 8.1 (*) 8.4 - 10.5 mg/dL   GFR calc non Af Amer 40 (*) >90 mL/min   GFR calc Af Amer 47 (*) >90 mL/min   Comment: (NOTE)     The eGFR has been calculated using the CKD EPI equation.     This calculation has not been validated in all clinical situations.     eGFR's persistently <90 mL/min signify possible Chronic Kidney     Disease.   Anion gap 10  5 - 15  PRO B NATRIURETIC PEPTIDE     Status: Abnormal   Collection Time    11/19/13 10:50 AM      Result Value Ref Range   Pro B Natriuretic peptide (BNP) 7161.0 (*) 0 - 450 pg/mL  I-STAT TROPOININ, ED     Status: Abnormal   Collection Time    11/19/13 11:08 AM      Result Value Ref Range   Troponin i, poc 0.28 (*) 0.00 - 0.08 ng/mL   Comment NOTIFIED PHYSICIAN     Comment 3            Comment: Due to the release kinetics of cTnI,     a negative result within the first hours     of the onset of symptoms does not rule out     myocardial infarction with certainty.     If myocardial infarction is still suspected,     repeat the test at appropriate intervals.  I-STAT CG4 LACTIC ACID, ED     Status: Abnormal   Collection Time    11/19/13 11:10 AM      Result Value Ref Range   Lactic Acid, Venous 2.35 (*) 0.5 - 2.2 mmol/L  CK TOTAL AND CKMB     Status: None   Collection Time    11/19/13  1:20 PM      Result Value Ref Range   Total CK 35  7 - 232 U/L   CK, MB 3.8  0.3 - 4.0 ng/mL   Relative Index RELATIVE INDEX IS INVALID  0.0 - 2.5   Comment: WHEN CK < 100 U/L             TROPONIN I  Status: Abnormal   Collection Time    11/19/13  4:42 PM      Result Value Ref Range   Troponin I 0.51 (*) <0.30 ng/mL   Comment:            Due to the release kinetics of cTnI,     a negative result within the first hours     of the  onset of symptoms does not rule out     myocardial infarction with certainty.     If myocardial infarction is still suspected,     repeat the test at appropriate intervals.     CRITICAL RESULT CALLED TO, READ BACK BY AND VERIFIED WITH:     K BLOODWORTH,RN 1825 11/19/13 D BRADLEY  CBC     Status: Abnormal   Collection Time    11/19/13  7:32 PM      Result Value Ref Range   WBC 7.7  4.0 - 10.5 K/uL   RBC 3.40 (*) 4.22 - 5.81 MIL/uL   Hemoglobin 10.3 (*) 13.0 - 17.0 g/dL   HCT 33.0 (*) 39.0 - 52.0 %   MCV 97.1  78.0 - 100.0 fL   MCH 30.3  26.0 - 34.0 pg   MCHC 31.2  30.0 - 36.0 g/dL   RDW 16.7 (*) 11.5 - 15.5 %   Platelets 273  150 - 400 K/uL  CREATININE, SERUM     Status: Abnormal   Collection Time    11/19/13  7:32 PM      Result Value Ref Range   Creatinine, Ser 1.49 (*) 0.50 - 1.35 mg/dL   GFR calc non Af Amer 43 (*) >90 mL/min   GFR calc Af Amer 49 (*) >90 mL/min   Comment: (NOTE)     The eGFR has been calculated using the CKD EPI equation.     This calculation has not been validated in all clinical situations.     eGFR's persistently <90 mL/min signify possible Chronic Kidney     Disease.  MAGNESIUM     Status: None   Collection Time    11/19/13  7:32 PM      Result Value Ref Range   Magnesium 1.8  1.5 - 2.5 mg/dL  PHOSPHORUS     Status: None   Collection Time    11/19/13  7:32 PM      Result Value Ref Range   Phosphorus 2.5  2.3 - 4.6 mg/dL  TSH     Status: None   Collection Time    11/19/13  7:32 PM      Result Value Ref Range   TSH 3.000  0.350 - 4.500 uIU/mL  TROPONIN I     Status: Abnormal   Collection Time    11/19/13 10:42 PM      Result Value Ref Range   Troponin I 0.31 (*) <0.30 ng/mL   Comment:            Due to the release kinetics of cTnI,     a negative result within the first hours     of the onset of symptoms does not rule out     myocardial infarction with certainty.     If myocardial infarction is still suspected,     repeat the test at  appropriate intervals.     CRITICAL VALUE NOTED.  VALUE IS CONSISTENT WITH PREVIOUSLY REPORTED AND CALLED VALUE.  TROPONIN I     Status: None   Collection Time  11/20/13  4:40 AM      Result Value Ref Range   Troponin I <0.30  <0.30 ng/mL   Comment:            Due to the release kinetics of cTnI,     a negative result within the first hours     of the onset of symptoms does not rule out     myocardial infarction with certainty.     If myocardial infarction is still suspected,     repeat the test at appropriate intervals.  BASIC METABOLIC PANEL     Status: Abnormal   Collection Time    11/20/13  4:40 AM      Result Value Ref Range   Sodium 144  137 - 147 mEq/L   Potassium 4.4  3.7 - 5.3 mEq/L   Chloride 112  96 - 112 mEq/L   CO2 23  19 - 32 mEq/L   Glucose, Bld 94  70 - 99 mg/dL   BUN 15  6 - 23 mg/dL   Creatinine, Ser 1.41 (*) 0.50 - 1.35 mg/dL   Calcium 7.5 (*) 8.4 - 10.5 mg/dL   GFR calc non Af Amer 45 (*) >90 mL/min   GFR calc Af Amer 53 (*) >90 mL/min   Comment: (NOTE)     The eGFR has been calculated using the CKD EPI equation.     This calculation has not been validated in all clinical situations.     eGFR's persistently <90 mL/min signify possible Chronic Kidney     Disease.   Anion gap 9  5 - 15  CBC     Status: Abnormal   Collection Time    11/20/13  4:40 AM      Result Value Ref Range   WBC 7.0  4.0 - 10.5 K/uL   RBC 3.10 (*) 4.22 - 5.81 MIL/uL   Hemoglobin 9.7 (*) 13.0 - 17.0 g/dL   HCT 30.3 (*) 39.0 - 52.0 %   MCV 97.7  78.0 - 100.0 fL   MCH 31.3  26.0 - 34.0 pg   MCHC 32.0  30.0 - 36.0 g/dL   RDW 16.9 (*) 11.5 - 15.5 %   Platelets 253  150 - 400 K/uL    Imaging: Imaging results have been reviewed and Dg Chest Port 1 View  11/19/2013   CLINICAL DATA:  Shortness of breath.  Tachycardia .  EXAM: PORTABLE CHEST - 1 VIEW  COMPARISON:  10/16/2013.10/11/2013.  FINDINGS: Mediastinal structures normal. Cardiomegaly with normal pulmonary vascularity. Again  noted are chronic interstitial changes consistent with chronic interstitial lung disease. Underlying pneumonitis or pulmonary interstitial edema cannot be completely excluded . No prominent pleural effusion. No pneumothorax. No acute bony abnormality.  IMPRESSION: 1. Stable cardiomegaly.  No pulmonary venous congestion. 2. Again noted are changes of chronic interstitial lung disease. Underlying pneumonitis or pulmonary interstitial edema cannot be completely excluded.   Electronically Signed   By: Nash   On: 11/19/2013 11:18    Assessment:  1. Principal Problem: 2.   Hypotension 3. Active Problems: 4.   Coronary atherosclerosis 5.   Chronic obstructive pulmonary disease (COPD) 6.   SVT (supraventricular tachycardia) 7.   Demand ischemia 8.   Plan:  1. SVT, probably AVN reentry - DC home on sustained release diltiazem 120 mg daily. Option to consider AVN RF modification if he has frequent events. 2. Demand ischemia - no need for inpatient evaluation, but may be reasonable for an outpt.  Evaluation since it has been probably >10 years since last eval.  Ready for DC from cardiac point of view  Time Spent Directly with Patient:  30 minutes  Length of Stay:  LOS: 2 days    CROITORU,MIHAI 11/21/2013, 9:25 AM

## 2013-11-21 NOTE — Progress Notes (Signed)
Pt woke up extremely agitated wanting to go home. Called pts wife to help orient pt to place, call was unsuccessful. nursg explained to pt that he can get out of bed and move with assistance and oxygen. Walked pt in room, pt sob sat 96% on 4lnc.  Encouraged pt to sit down and rest. Offered pt a bath pt refused. Pt back in bed, more oriented to situation. Monitoring will continue.

## 2013-11-21 NOTE — Discharge Summary (Addendum)
Physician Discharge Summary  Evan [name] UKG:[id number] DOB: 08/22/1931 DOA: 11/19/2013  PCP: Katy Apo, MD  Admit date: 11/19/2013 Discharge date: 11/21/2013  Time spent: 30 minutes  Recommendations for Outpatient Follow-up:  1. Follow up with PCP in one week.  2. Follow up with cardiology as recommended.   Discharge Diagnoses:  Principal Problem:   Hypotension Active Problems:   Coronary atherosclerosis   Chronic obstructive pulmonary disease (COPD)   SVT (supraventricular tachycardia)   Demand ischemia  Severe malnutrition. Discharge Condition: improved  Diet recommendation: low sodium diet.   Filed Weights   11/19/13 1043  Weight: 74.844 kg (165 lb)    History of present illness:  Evan [name] is a 80 y.o. male  With history of MI several years ago, hyperlipidemia, and hypertension. Who presented to the ED after developing sudden onset palpitations . He was found to be hypotensive and started him on IV fluids. We were consulted by ED physicians for medical admission given low blood pressures and recommended monitoring by cardiology.    Hospital Course:  SVT resolved and rate controlled with Cardizem 120 mg .   Hypotension resolved with IV fluids  Elevated troponin's: demand ischemia from SVT. Troponin back to normal.  Medical management.      Procedures:  none  Consultations:  cardiology  Discharge Exam: Filed Vitals:   11/21/13 1213  BP: 106/56  Pulse:   Temp:   Resp:     General: alert afebrile comfortable Cardiovascular: s1s2 Respiratory: ctab  Discharge Instructions You were cared for by a hospitalist during your hospital stay. If you have any questions about your discharge medications or the care you received while you were in the hospital after you are discharged, you can call the unit and asked to speak with the hospitalist on call if the hospitalist that took care of you is not available. Once you are discharged, your  primary care physician will handle any further medical issues. Please note that NO REFILLS for any discharge medications will be authorized once you are discharged, as it is imperative that you return to your primary care physician (or establish a relationship with a primary care physician if you do not have one) for your aftercare needs so that they can reassess your need for medications and monitor your lab values.  Discharge Instructions   Call MD for:  persistant dizziness or light-headedness    Complete by:  As directed      Diet - low sodium heart healthy    Complete by:  As directed      Discharge instructions    Complete by:  As directed   Follow up with cardiology as recommended in 2 to4 weeks.          Current Discharge Medication List    START taking these medications   Details  aspirin 81 MG chewable tablet Chew 1 tablet (81 mg total) by mouth daily. Qty: 30 tablet, Refills: 1    atorvastatin (LIPITOR) 20 MG tablet Take 1 tablet (20 mg total) by mouth at bedtime. Qty: 30 tablet, Refills: 1    diltiazem (CARDIZEM CD) 120 MG 24 hr capsule Take 1 capsule (120 mg total) by mouth daily. Qty: 30 capsule, Refills: 1    feeding supplement, ENSURE COMPLETE, (ENSURE COMPLETE) LIQD Take 237 mLs by mouth 3 (three) times daily between meals.      CONTINUE these medications which have NOT CHANGED   Details  ALPRAZolam (XANAX) 0.5 MG tablet Take 0.5 mg  by mouth 3 (three) times daily as needed for anxiety.     famotidine (PEPCID) 20 MG tablet Take 20 mg by mouth daily.    losartan (COZAAR) 100 MG tablet Take 1 tablet by mouth daily.    mirtazapine (REMERON) 30 MG tablet Take 1 tablet by mouth at bedtime.    oxyCODONE (OXY IR/ROXICODONE) 5 MG immediate release tablet Take 5 mg by mouth every 6 (six) hours as needed (for pain).     tamsulosin (FLOMAX) 0.4 MG CAPS capsule Take 0.4 mg by mouth daily.     temazepam (RESTORIL) 15 MG capsule Take 15 mg by mouth at bedtime. Take one  tablet daily at bedtime for sleep    zolpidem (AMBIEN) 5 MG tablet Take 5 mg by mouth at bedtime as needed.      STOP taking these medications     clonazePAM (KLONOPIN) 1 MG tablet      metoprolol succinate (TOPROL-XL) 25 MG 24 hr tablet      simvastatin (ZOCOR) 40 MG tablet        No Known Allergies Follow-up Information   Follow up with POLITE,RONALD D, MD. Schedule an appointment as soon as possible for a visit in 1 week.   Specialty:  Internal Medicine   Contact information:   [address] [place] [address] [phone]       Follow up with Donato Schultz, MD. Schedule an appointment as soon as possible for a visit in 1 week.   Specialty:  Cardiology   Contact information:   [address] [place] [address] [phone]       Please follow up. (the office should call you Monday to arrange.)       Follow up with [facility]. (home health physical therapy and nurse)    Contact information:   [address] [place] [address] [phone]        The results of significant diagnostics from this hospitalization (including imaging, microbiology, ancillary and laboratory) are listed below for reference.    Significant Diagnostic Studies: Dg Chest Port 1 View  11/19/2013   CLINICAL DATA:  Shortness of breath.  Tachycardia .  EXAM: PORTABLE CHEST - 1 VIEW  COMPARISON:  10/16/2013.10/11/2013.  FINDINGS: Mediastinal structures normal. Cardiomegaly with normal pulmonary vascularity. Again noted are chronic interstitial changes consistent with chronic interstitial lung disease. Underlying pneumonitis or pulmonary interstitial edema cannot be completely excluded . No prominent pleural effusion. No pneumothorax. No acute bony abnormality.  IMPRESSION: 1. Stable cardiomegaly.  No pulmonary venous congestion. 2. Again noted are changes of chronic interstitial lung disease. Underlying pneumonitis or pulmonary  interstitial edema cannot be completely excluded.   Electronically Signed   By: Maisie Fus  Register   On: 11/19/2013 11:18    Microbiology: No results found for this or any previous visit (from the past 240 hour(s)).   Labs: Basic Metabolic Panel:  Recent Labs Lab 11/19/13 1050 11/19/13 1932 11/20/13 0440  NA 143  --  144  K 4.9  --  4.4  CL 109  --  112  CO2 24  --  23  GLUCOSE 165*  --  94  BUN 18  --  15  CREATININE 1.56* 1.49* 1.41*  CALCIUM 8.1*  --  7.5*  MG  --  1.8  --   PHOS  --  2.5  --    Liver Function Tests: No results found for this basename: AST, ALT, ALKPHOS, BILITOT,  PROT, ALBUMIN,  in the last 168 hours No results found for this basename: LIPASE, AMYLASE,  in the last 168 hours No results found for this basename: AMMONIA,  in the last 168 hours CBC:  Recent Labs Lab 11/19/13 1050 11/19/13 1932 11/20/13 0440  WBC 8.5 7.7 7.0  HGB 10.5* 10.3* 9.7*  HCT 34.0* 33.0* 30.3*  MCV 98.3 97.1 97.7  PLT 277 273 253   Cardiac Enzymes:  Recent Labs Lab 11/19/13 1320 11/19/13 1642 11/19/13 2242 11/20/13 0440  CKTOTAL 35  --   --   --   CKMB 3.8  --   --   --   TROPONINI  --  0.51* 0.31* <0.30   BNP: BNP (last 3 results)  Recent Labs  09/23/13 1742 11/19/13 1050  PROBNP 290.5 7161.0*   CBG: No results found for this basename: GLUCAP,  in the last 168 hours     Signed:  Jamari Moten  [facility] 11/21/2013, 12:18 PM

## 2013-11-24 ENCOUNTER — Emergency Department (HOSPITAL_COMMUNITY)
Admission: EM | Admit: 2013-11-24 | Discharge: 2013-11-25 | Disposition: A | Discharge status: Home / Self Care | Payer: Medicare Other | Attending: Emergency Medicine | Admitting: Emergency Medicine

## 2013-11-24 ENCOUNTER — Emergency Department (HOSPITAL_COMMUNITY): Payer: Medicare Other

## 2013-11-24 ENCOUNTER — Encounter (HOSPITAL_COMMUNITY): Payer: Self-pay

## 2013-11-24 DIAGNOSIS — Z7982 Long term (current) use of aspirin: Secondary | ICD-10-CM | POA: Insufficient documentation

## 2013-11-24 DIAGNOSIS — J449 Chronic obstructive pulmonary disease, unspecified: Secondary | ICD-10-CM | POA: Insufficient documentation

## 2013-11-24 DIAGNOSIS — Z951 Presence of aortocoronary bypass graft: Secondary | ICD-10-CM | POA: Diagnosis not present

## 2013-11-24 DIAGNOSIS — R002 Palpitations: Secondary | ICD-10-CM | POA: Insufficient documentation

## 2013-11-24 DIAGNOSIS — Z87891 Personal history of nicotine dependence: Secondary | ICD-10-CM | POA: Diagnosis not present

## 2013-11-24 DIAGNOSIS — Z79899 Other long term (current) drug therapy: Secondary | ICD-10-CM | POA: Diagnosis not present

## 2013-11-24 DIAGNOSIS — Z9861 Coronary angioplasty status: Secondary | ICD-10-CM | POA: Diagnosis not present

## 2013-11-24 DIAGNOSIS — Z862 Personal history of diseases of the blood and blood-forming organs and certain disorders involving the immune mechanism: Secondary | ICD-10-CM | POA: Insufficient documentation

## 2013-11-24 DIAGNOSIS — Z8679 Personal history of other diseases of the circulatory system: Secondary | ICD-10-CM | POA: Insufficient documentation

## 2013-11-24 DIAGNOSIS — I2510 Atherosclerotic heart disease of native coronary artery without angina pectoris: Secondary | ICD-10-CM | POA: Diagnosis not present

## 2013-11-24 DIAGNOSIS — M069 Rheumatoid arthritis, unspecified: Secondary | ICD-10-CM | POA: Diagnosis not present

## 2013-11-24 LAB — COMPREHENSIVE METABOLIC PANEL
ALBUMIN: 2.5 g/dL — AB (ref 3.5–5.2)
ALK PHOS: 123 U/L — AB (ref 39–117)
ALT: 12 U/L (ref 0–53)
AST: 16 U/L (ref 0–37)
Anion gap: 11 (ref 5–15)
BUN: 17 mg/dL (ref 6–23)
CO2: 30 mEq/L (ref 19–32)
Calcium: 8.7 mg/dL (ref 8.4–10.5)
Chloride: 104 mEq/L (ref 96–112)
Creatinine, Ser: 1.33 mg/dL (ref 0.50–1.35)
GFR calc Af Amer: 57 mL/min — ABNORMAL LOW (ref >90)
GFR, EST NON AFRICAN AMERICAN: 49 mL/min — AB (ref >90)
Glucose, Bld: 124 mg/dL — ABNORMAL HIGH (ref 70–99)
POTASSIUM: 4.1 meq/L (ref 3.7–5.3)
SODIUM: 145 meq/L (ref 137–147)
Total Bilirubin: 0.2 mg/dL — ABNORMAL LOW (ref 0.3–1.2)
Total Protein: 5.8 g/dL — ABNORMAL LOW (ref 6.0–8.3)

## 2013-11-24 LAB — CBC WITH DIFFERENTIAL/PLATELET
BASOS PCT: 0 % (ref 0–1)
Basophils Absolute: 0 10*3/uL (ref 0.0–0.1)
EOS PCT: 4 % (ref 0–5)
Eosinophils Absolute: 0.3 10*3/uL (ref 0.0–0.7)
HEMATOCRIT: 35.5 % — AB (ref 39.0–52.0)
HEMOGLOBIN: 11.1 g/dL — AB (ref 13.0–17.0)
Lymphocytes Relative: 26 % (ref 12–46)
Lymphs Abs: 2.1 10*3/uL (ref 0.7–4.0)
MCH: 29.9 pg (ref 26.0–34.0)
MCHC: 31.3 g/dL (ref 30.0–36.0)
MCV: 95.7 fL (ref 78.0–100.0)
MONO ABS: 0.9 10*3/uL (ref 0.1–1.0)
MONOS PCT: 12 % (ref 3–12)
NEUTROS ABS: 4.7 10*3/uL (ref 1.7–7.7)
Neutrophils Relative %: 58 % (ref 43–77)
Platelets: 357 10*3/uL (ref 150–400)
RBC: 3.71 MIL/uL — ABNORMAL LOW (ref 4.22–5.81)
RDW: 16.1 % — AB (ref 11.5–15.5)
WBC: 8.1 10*3/uL (ref 4.0–10.5)

## 2013-11-24 LAB — TROPONIN I

## 2013-11-24 MED ORDER — SODIUM CHLORIDE 0.9 % IV BOLUS (SEPSIS)
1000.0000 mL | Freq: Once | INTRAVENOUS | Status: AC
  Administered 2013-11-24: 1000 mL via INTRAVENOUS

## 2013-11-24 MED ORDER — DILTIAZEM HCL ER COATED BEADS 120 MG PO CP24
120.0000 mg | ORAL_CAPSULE | Freq: Once | ORAL | Status: AC
  Administered 2013-11-24: 120 mg via ORAL
  Filled 2013-11-24: qty 1

## 2013-11-24 MED ORDER — DILTIAZEM HCL ER COATED BEADS 120 MG PO CP24: 120.0000 mg | ORAL_CAPSULE | Freq: Every day | ORAL | Status: AC

## 2013-11-24 NOTE — ED Notes (Signed)
Per EMS: Pt experiencing heart palpitations, performed vagal maneuvers. Pt ST on monitor, HR 110's with EMS. Pt 6L O2 at home. Denies any chest pain.

## 2013-11-24 NOTE — ED Provider Notes (Signed)
CSN: [id number]     Arrival date & time 11/24/13  2003 History   First MD Initiated Contact with Patient 11/24/13 2007     Chief Complaint  Patient presents with  . Palpitations      HPI Patient presents to the emergency department with acute onset palpitations which began tonight.  He states that now resolved.  They lasted for about 30-40 minutes.  Mild associated shortness of breath.  He states this recurrently continues.  He was recently hospitalized and discharged 4 days ago after admission for intermittent SVT.  He was discharged home on Cardizem.  The patient states that he was unclear which medications he was to continue when he returned home and and therefore he has not been taking any of his medications.  He has not been taking the Cardizem as he was prescribed either.  He states he had no chest pressure or pain during the event just felt a fast heartbeat.  No syncope.  He has not called cardiology for follow-up as he was instructed.  He has no other complaints at this time.  He states he feels much better at this time.    Past Medical History  Diagnosis Date  . Coronary atherosclerosis of unspecified type of vessel, native or graft 2003  . Rheumatoid arthritis(714.0)   . Other emphysema   . Postinflammatory pulmonary fibrosis   . Acute bronchitis   . Chronic airway obstruction, not elsewhere classified   . Anemia 2010    Iron OK, folate > 20, ferritin   . Hypotension 11/20/2013   Past Surgical History  Procedure Laterality Date  . Coronary angioplasty with stent placement  2003    Stent RCA  . Foot surgery      left  . Vericose vein surgery    . Aortic aneuyrsm thoracic      endovascular br[facility]ham  . Cardiac catheterization  2004    LAD OK, CFX 30%, OM2 OK, RCA stent OK, 50% at end of stent, EF 59% med rx   Family History  Problem Relation Age of Onset  . Breast cancer Sister   . Colon cancer Father   . Pulmonary fibrosis Brother   . Heart attack Mother    History   Substance Use Topics  . Smoking status: Former Smoker -- 2.00 packs/day for 40 years    Types: Cigarettes    Quit date: 01/22/1985  . Smokeless tobacco: Never Used  . Alcohol Use: Yes     Comment: rare ETOH use    Review of Systems  All other systems reviewed and are negative.     Allergies  Review of patient's allergies indicates no known allergies.  Home Medications   Prior to Admission medications   Medication Sig Start Date End Date Taking? Authorizing Provider  ALPRAZolam Prudy Feeler) 0.5 MG tablet Take 0.5 mg by mouth 3 (three) times daily as needed for anxiety.  11/04/13  Yes Historical Provider, MD  aspirin 81 MG chewable tablet Chew 1 tablet (81 mg total) by mouth daily. 11/21/13  Yes Kathlen Mody, MD  atorvastatin (LIPITOR) 20 MG tablet Take 1 tablet (20 mg total) by mouth at bedtime. 11/21/13  Yes Kathlen Mody, MD  diltiazem (CARDIZEM CD) 120 MG 24 hr capsule Take 1 capsule (120 mg total) by mouth daily. 11/21/13  Yes Kathlen Mody, MD  famotidine (PEPCID) 20 MG tablet Take 20 mg by mouth daily. 11/04/13  Yes Historical Provider, MD  feeding supplement, ENSURE COMPLETE, (ENSURE COMPLETE) LIQD Take 237  mLs by mouth 3 (three) times daily between meals. 11/21/13  Yes Kathlen Mody, MD  losartan (COZAAR) 100 MG tablet Take 1 tablet by mouth daily.   Yes Historical Provider, MD  mirtazapine (REMERON) 30 MG tablet Take 1 tablet by mouth at bedtime.   Yes Historical Provider, MD  oxyCODONE (OXY IR/ROXICODONE) 5 MG immediate release tablet Take 5 mg by mouth every 6 (six) hours as needed (for pain).  11/04/13  Yes Historical Provider, MD  tamsulosin (FLOMAX) 0.4 MG CAPS capsule Take 0.4 mg by mouth daily.  11/04/13  Yes Historical Provider, MD  temazepam (RESTORIL) 15 MG capsule Take 15 mg by mouth at bedtime. Take one tablet daily at bedtime for sleep 10/26/12  Yes Historical Provider, MD  zolpidem (AMBIEN) 5 MG tablet Take 5 mg by mouth at bedtime as needed for sleep.  11/04/13  Yes  Historical Provider, MD   BP 114/73 mmHg  Pulse 98  Temp(Src) 97.7 F (36.5 C)  Resp 20  SpO2 95% Physical Exam  Constitutional: He is oriented to person, place, and time. He appears well-developed and well-nourished.  HENT:  Head: Normocephalic and atraumatic.  Eyes: EOM are normal.  Neck: Normal range of motion.  Cardiovascular: Normal rate, regular rhythm, normal heart sounds and intact distal pulses.   Pulmonary/Chest: Effort normal and breath sounds normal. No respiratory distress.  Abdominal: Soft. He exhibits no distension. There is no tenderness.  Musculoskeletal: Normal range of motion.  Neurological: He is alert and oriented to person, place, and time.  Skin: Skin is warm and dry.  Psychiatric: He has a normal mood and affect. Judgment normal.  Nursing note and vitals reviewed.   ED Course  Procedures (including critical care time) Labs Review Labs Reviewed  COMPREHENSIVE METABOLIC PANEL - Abnormal; Notable for the following:    Glucose, Bld 124 (*)    Total Protein 5.8 (*)    Albumin 2.5 (*)    Alkaline Phosphatase 123 (*)    Total Bilirubin 0.2 (*)    GFR calc non Af Amer 49 (*)    GFR calc Af Amer 57 (*)    All other components within normal limits  CBC WITH DIFFERENTIAL - Abnormal; Notable for the following:    RBC 3.71 (*)    Hemoglobin 11.1 (*)    HCT 35.5 (*)    RDW 16.1 (*)    All other components within normal limits  TROPONIN I    Imaging Review Dg Chest 2 View  11/24/2013   CLINICAL DATA:  Palpitations and shortness of breath tonight.  EXAM: CHEST  2 VIEW  COMPARISON:  11/19/2013  FINDINGS: Shallow inspiration. Cardiac enlargement. Diffuse coarse interstitial fibrosis in the lungs. Increasing interstitial changes since previous study suggesting developing interstitial edema. No blunting of costophrenic angles. No pneumothorax. Calcified and tortuous aorta.  IMPRESSION: Cardiac enlargement with chronic pulmonary fibrosis. Increasing interstitial  changes suggest developing interstitial edema or pneumonitis.   Electronically Signed   By: Burman Nieves M.D.   On: 11/24/2013 22:19     EKG Interpretation   Date/Time:  Tuesday November 24 2013 20:12:11 EST Ventricular Rate:  108 PR Interval:  206 QRS Duration: 99 QT Interval:  345 QTC Calculation: 462 R Axis:   -78 Text Interpretation:  Sinus tachycardia Consider right atrial enlargement  Left anterior fascicular block Probable right ventricular hypertrophy No  significant change was found Confirmed by Jayah Balthazar  MD, Adaline Trejos ([id number]) on  11/24/2013 11:59:23 PM      MDM  Final diagnoses:  Palpitations    Patient feels much better at this time.  This appears to be SVT based on his recent hospitalization.  Normal sinus rhythm with mild sinus tachycardia noted here. Basic labs and x-ray without significant abnormalities.  I had a long discussion with the patient regarding his recent diagnoses that were made during his last hospitalization and the changes medications that were incurred.  We went over his medication list medication by medication and the reason he is supposed to be taking each of them.  He's not sure if he ever feel the Cardizem therefore a repeat prescription was given.  I've asked the patient to go home and take his sheet with the medications listed on it and figure out all the medications he is supposed to be on and to discard all other medications until he follows up with his primary care physician.  He is scheduled to see his primary care doctor in the next 48 hours.    Lyanne Co, MD 11/25/13 0001

## 2013-11-24 NOTE — Discharge Instructions (Signed)
Supraventricular Tachycardia °Supraventricular tachycardia (SVT) is an abnormal heart rhythm (arrhythmia) that causes the heart to beat very fast (tachycardia). This kind of fast heartbeat originates in the upper chambers of the heart (atria). SVT can cause the heart to beat greater than 100 beats per minute. SVT can have a rapid burst of heartbeats. This can start and stop suddenly without warning and is called nonsustained. SVT can also be sustained, in which the heart beats at a continuous fast rate.  °CAUSES  °There can be different causes of SVT. Some of these include: °· Heart valve problems such as mitral valve prolapse. °· An enlarged heart (hypertrophic cardiomyopathy). °· Congenital heart problems. °· Heart inflammation (pericarditis). °· Hyperthyroidism. °· Low potassium or magnesium levels. °· Caffeine. °· Drug use such as cocaine, methamphetamines, or stimulants. °· Some over-the-counter medicines such as: °¨ Decongestants. °¨ Diet medicines. °¨ Herbal medicines. °SYMPTOMS  °Symptoms of SVT can vary. Symptoms depend on whether the SVT is sustained or nonsustained. You may experience: °· No symptoms (asymptomatic). °· An awareness of your heart beating rapidly (palpitations). °· Shortness of breath. °· Chest pain or pressure. °If your blood pressure drops because of the SVT, you may experience: °· Fainting or near fainting. °· Weakness. °· Dizziness. °DIAGNOSIS  °Different tests can be performed to diagnose SVT, such as: °· An electrocardiogram (EKG). This is a painless test that records the electrical activity of your heart. °· Holter monitor. This is a 24 hour recording of your heart rhythm. You will be given a diary. Write down all symptoms that you have and what you were doing at the time you experienced symptoms. °· Arrhythmia monitor. This is a small device that your wear for several weeks. It records the heart rhythm when you have symptoms. °· Echocardiogram. This is an imaging test to help detect  abnormal heart structure such as congenital abnormalities, heart valve problems, or heart enlargement. °· Stress test. This test can help determine if the SVT is related to exercise. °· Electrophysiology study (EPS). This is a procedure that evaluates your heart's electrical system and can help your caregiver find the cause of your SVT. °TREATMENT  °Treatment of SVT depends on the symptoms, how often it recurs, and whether there are any underlying heart problems.  °· If symptoms are rare and no other cardiac disease is present, no treatment may be needed. °· Blood work may be done to check potassium, magnesium, and thyroid hormone levels to see if they are abnormal. If these levels are abnormal, treatment to correct the problems will occur. °Medicines °Your caregiver may use oral medicines to treat SVT. These medicines are given for long-term control of SVT. Medicines may be used alone or in combination with other treatments. These medicines work to slow nerve impulses in the heart muscle. These medicines can also be used to treat high blood pressure. Some of these medicines may include: °· Calcium channel blockers. °· Beta blockers. °· Digoxin. °Nonsurgical procedures °Nonsurgical techniques may be used if oral medicines do not work. Some examples include: °· Cardioversion. This technique uses either drugs or an electrical shock to restore a normal heart rhythm. °¨ Cardioversion drugs may be given through an intravenous (IV) line to help "reset" the heart rhythm. °¨ In electrical cardioversion, the caregiver shocks your heart to stop its beat for a split second. This helps to reset the heart to a normal rhythm. °· Ablation. This procedure is done under mild sedation. High frequency radio wave energy is used to   destroy the area of heart tissue responsible for the SVT. °HOME CARE INSTRUCTIONS  °· Do not smoke. °· Only take medicines prescribed by your caregiver. Check with your caregiver before using over-the-counter  medicines. °· Check with your caregiver about how much alcohol and caffeine (coffee, tea, colas, or chocolate) you may have. °· It is very important to keep all follow-up referrals and appointments in order to properly manage this problem. °SEEK IMMEDIATE MEDICAL CARE IF: °· You have dizziness. °· You faint or nearly faint. °· You have shortness of breath. °· You have chest pain or pressure. °· You have sudden nausea or vomiting. °· You have profuse sweating. °· You are concerned about how long your symptoms last. °· You are concerned about the frequency of your SVT episodes. °If you have the above symptoms, call your local emergency services (911 in [place] immediately. Do not drive yourself to the hospital. °MAKE SURE YOU:  °· Understand these instructions. °· Will watch your condition. °· Will get help right away if you are not doing well or get worse. °Document Released: 01/08/2005 Document Revised: 04/02/2011 Document Reviewed: 04/22/2008 °Exit[employer]® Patient Information ©2015 [employer]. This information is not intended to replace advice given to you by your health care provider. Make sure you discuss any questions you have with your health care provider. ° °

## 2013-11-30 ENCOUNTER — Ambulatory Visit: Payer: Medicare Other | Admitting: Internal Medicine

## 2013-12-02 ENCOUNTER — Telehealth: Payer: Self-pay | Admitting: Internal Medicine

## 2013-12-02 NOTE — Telephone Encounter (Signed)
Called spoke with Kennith Center [facility]) who is requesting an update on pt's O2 liter flow.  The last documentation we have in the Care Coordination Note is from 2012 and has pt using 4lpm.  There is a phone note where the pt's spouse was concerned about pt's sats at 75% on 6lpm.  MR advised ov but pt was admitted the next day.  Kennith Center stated that if pt needs to be on 6lpm during the day or increased at bedtime, he will need an order for a concentrator that will support this liter flow - [facility].    Tracey to send fax to the office with pt's current sats for MR's recs Kennith Center is aware [place] but will need to try to get pt taken care of Asked Kennith Center about pt following up with our office - she stated that pt is having transportation issues and social work has gotten involved Will hold message in triage to await fax

## 2013-12-02 NOTE — Telephone Encounter (Signed)
tracey calling back [phone]

## 2013-12-02 NOTE — Telephone Encounter (Signed)
lmomtcb x1 for Evan [name]

## 2013-12-03 NOTE — Telephone Encounter (Signed)
Fax not received  LMTCB for [employer]

## 2013-12-07 NOTE — Telephone Encounter (Signed)
Verlon Au have you received this fax yet?  thanks

## 2013-12-07 NOTE — Telephone Encounter (Signed)
lmomtcb x 2  

## 2013-12-07 NOTE — Telephone Encounter (Signed)
Fax never recieved

## 2013-12-08 ENCOUNTER — Telehealth: Payer: Self-pay | Admitting: Internal Medicine

## 2013-12-08 NOTE — Telephone Encounter (Signed)
lmtcb x1 

## 2013-12-08 NOTE — Telephone Encounter (Signed)
lmtcb for [employer].

## 2013-12-08 NOTE — Telephone Encounter (Signed)
Kennith [facility], RN w/ [facility] returned call & can be reached at [phone].  Antionette Fairy

## 2013-12-08 NOTE — Telephone Encounter (Signed)
LMTC x 1  

## 2013-12-09 NOTE — Telephone Encounter (Signed)
Evan [name] returned call. Evan [name] is requesting to try pt on a intercostal muscle trainer device that allows HF and respiratory disease pts to have more ease in breathing and decrease their SOB. The trainer allows for inspiratory and expiratory resistance to help build the intercostal muscles. If MR is agreeable to "the breather" then Evan [name] will monitor and track the pt's breathing status and improvement. Advised Evan [name] that once MR responds to message we will call back.  MR please advise.

## 2013-12-09 NOTE — Telephone Encounter (Signed)
Called, spoke with [name] with [name].  Advised of below per MR.  [name] verbalized understanding and voiced no further questions or concerns at this time.

## 2013-12-09 NOTE — Telephone Encounter (Signed)
Spoke with Tracey(RN), states that she will have [name] w/[employer] call us back with more info as to what is needed.

## 2013-12-09 NOTE — Telephone Encounter (Signed)
That is fine with me. Please tell [name] to use it

## 2013-12-09 NOTE — Telephone Encounter (Signed)
[name] returning call can be reached @ [phone].Caren Griffins

## 2013-12-09 NOTE — Telephone Encounter (Signed)
LMOM TCB x2

## 2013-12-10 NOTE — Telephone Encounter (Signed)
Duplicate message. This has been taken care of per [name] at [facility].Carron Curie, CMA

## 2013-12-10 NOTE — Telephone Encounter (Signed)
lmomtcb for tracey

## 2013-12-19 ENCOUNTER — Emergency Department (HOSPITAL_COMMUNITY): Payer: Medicare Other

## 2013-12-19 ENCOUNTER — Encounter (HOSPITAL_COMMUNITY): Payer: Self-pay

## 2013-12-19 ENCOUNTER — Emergency Department (HOSPITAL_COMMUNITY)
Admission: EM | Admit: 2013-12-19 | Discharge: 2013-12-19 | Disposition: A | Discharge status: Home / Self Care | Payer: Medicare Other | Attending: Emergency Medicine | Admitting: Emergency Medicine

## 2013-12-19 DIAGNOSIS — Z7982 Long term (current) use of aspirin: Secondary | ICD-10-CM | POA: Diagnosis not present

## 2013-12-19 DIAGNOSIS — I2510 Atherosclerotic heart disease of native coronary artery without angina pectoris: Secondary | ICD-10-CM | POA: Insufficient documentation

## 2013-12-19 DIAGNOSIS — R5383 Other fatigue: Secondary | ICD-10-CM | POA: Insufficient documentation

## 2013-12-19 DIAGNOSIS — Z87891 Personal history of nicotine dependence: Secondary | ICD-10-CM | POA: Diagnosis not present

## 2013-12-19 DIAGNOSIS — W01198A Fall on same level from slipping, tripping and stumbling with subsequent striking against other object, initial encounter: Secondary | ICD-10-CM | POA: Diagnosis not present

## 2013-12-19 DIAGNOSIS — Z9889 Other specified postprocedural states: Secondary | ICD-10-CM | POA: Insufficient documentation

## 2013-12-19 DIAGNOSIS — J449 Chronic obstructive pulmonary disease, unspecified: Secondary | ICD-10-CM | POA: Insufficient documentation

## 2013-12-19 DIAGNOSIS — Z955 Presence of coronary angioplasty implant and graft: Secondary | ICD-10-CM | POA: Diagnosis not present

## 2013-12-19 DIAGNOSIS — S3991XA Unspecified injury of abdomen, initial encounter: Secondary | ICD-10-CM | POA: Insufficient documentation

## 2013-12-19 DIAGNOSIS — S5011XA Contusion of right forearm, initial encounter: Secondary | ICD-10-CM | POA: Diagnosis not present

## 2013-12-19 DIAGNOSIS — K5900 Constipation, unspecified: Secondary | ICD-10-CM | POA: Insufficient documentation

## 2013-12-19 DIAGNOSIS — W19XXXA Unspecified fall, initial encounter: Secondary | ICD-10-CM

## 2013-12-19 DIAGNOSIS — Y998 Other external cause status: Secondary | ICD-10-CM | POA: Diagnosis not present

## 2013-12-19 DIAGNOSIS — Y9389 Activity, other specified: Secondary | ICD-10-CM | POA: Insufficient documentation

## 2013-12-19 DIAGNOSIS — M069 Rheumatoid arthritis, unspecified: Secondary | ICD-10-CM | POA: Diagnosis not present

## 2013-12-19 DIAGNOSIS — S5012XA Contusion of left forearm, initial encounter: Secondary | ICD-10-CM | POA: Insufficient documentation

## 2013-12-19 DIAGNOSIS — Y92002 Bathroom of unspecified non-institutional (private) residence single-family (private) house as the place of occurrence of the external cause: Secondary | ICD-10-CM | POA: Diagnosis not present

## 2013-12-19 DIAGNOSIS — Z79899 Other long term (current) drug therapy: Secondary | ICD-10-CM | POA: Insufficient documentation

## 2013-12-19 DIAGNOSIS — R531 Weakness: Secondary | ICD-10-CM | POA: Insufficient documentation

## 2013-12-19 DIAGNOSIS — R140 Abdominal distension (gaseous): Secondary | ICD-10-CM | POA: Diagnosis not present

## 2013-12-19 DIAGNOSIS — Z862 Personal history of diseases of the blood and blood-forming organs and certain disorders involving the immune mechanism: Secondary | ICD-10-CM | POA: Insufficient documentation

## 2013-12-19 LAB — COMPREHENSIVE METABOLIC PANEL
ALK PHOS: 124 U/L — AB (ref 39–117)
ALT: 8 U/L (ref 0–53)
AST: 13 U/L (ref 0–37)
Albumin: 3.1 g/dL — ABNORMAL LOW (ref 3.5–5.2)
Anion gap: 12 (ref 5–15)
BUN: 21 mg/dL (ref 6–23)
CALCIUM: 8.9 mg/dL (ref 8.4–10.5)
CHLORIDE: 97 meq/L (ref 96–112)
CO2: 26 meq/L (ref 19–32)
Creatinine, Ser: 1.65 mg/dL — ABNORMAL HIGH (ref 0.50–1.35)
GFR calc Af Amer: 44 mL/min — ABNORMAL LOW (ref >90)
GFR, EST NON AFRICAN AMERICAN: 38 mL/min — AB (ref >90)
Glucose, Bld: 121 mg/dL — ABNORMAL HIGH (ref 70–99)
POTASSIUM: 4.7 meq/L (ref 3.7–5.3)
SODIUM: 135 meq/L — AB (ref 137–147)
Total Bilirubin: 0.3 mg/dL (ref 0.3–1.2)
Total Protein: 6.6 g/dL (ref 6.0–8.3)

## 2013-12-19 LAB — URINALYSIS, ROUTINE W REFLEX MICROSCOPIC
BILIRUBIN URINE: NEGATIVE
GLUCOSE, UA: NEGATIVE mg/dL
Hgb urine dipstick: NEGATIVE
Ketones, ur: NEGATIVE mg/dL
Leukocytes, UA: NEGATIVE
Nitrite: NEGATIVE
PH: 5 (ref 5.0–8.0)
Protein, ur: NEGATIVE mg/dL
Specific Gravity, Urine: 1.018 (ref 1.005–1.030)
Urobilinogen, UA: 0.2 mg/dL (ref 0.0–1.0)

## 2013-12-19 LAB — CBC WITH DIFFERENTIAL/PLATELET
BASOS ABS: 0 10*3/uL (ref 0.0–0.1)
Basophils Relative: 0 % (ref 0–1)
Eosinophils Absolute: 0.2 10*3/uL (ref 0.0–0.7)
Eosinophils Relative: 2 % (ref 0–5)
HCT: 33.4 % — ABNORMAL LOW (ref 39.0–52.0)
Hemoglobin: 10.3 g/dL — ABNORMAL LOW (ref 13.0–17.0)
Lymphocytes Relative: 7 % — ABNORMAL LOW (ref 12–46)
Lymphs Abs: 0.9 10*3/uL (ref 0.7–4.0)
MCH: 28.8 pg (ref 26.0–34.0)
MCHC: 30.8 g/dL (ref 30.0–36.0)
MCV: 93.3 fL (ref 78.0–100.0)
Monocytes Absolute: 1 10*3/uL (ref 0.1–1.0)
Monocytes Relative: 8 % (ref 3–12)
NEUTROS PCT: 83 % — AB (ref 43–77)
Neutro Abs: 9.7 10*3/uL — ABNORMAL HIGH (ref 1.7–7.7)
PLATELETS: 259 10*3/uL (ref 150–400)
RBC: 3.58 MIL/uL — ABNORMAL LOW (ref 4.22–5.81)
RDW: 15.4 % (ref 11.5–15.5)
WBC: 11.7 10*3/uL — AB (ref 4.0–10.5)

## 2013-12-19 LAB — I-STAT TROPONIN, ED: Troponin i, poc: 0.01 ng/mL (ref 0.00–0.08)

## 2013-12-19 MED ORDER — SODIUM CHLORIDE 0.9 % IV BOLUS (SEPSIS)
500.0000 mL | Freq: Once | INTRAVENOUS | Status: AC
  Administered 2013-12-19: 500 mL via INTRAVENOUS

## 2013-12-19 MED ORDER — BISACODYL 10 MG RE SUPP: 10.0000 mg | RECTAL | Status: AC | PRN

## 2013-12-19 MED ORDER — POLYETHYLENE GLYCOL 3350 17 G PO PACK: 17.0000 g | PACK | Freq: Every day | ORAL | Status: AC

## 2013-12-19 NOTE — ED Notes (Signed)
Patient required O2 @ 100% via NRB mask for about 5 mins to produce O2 sat > 92%. Per patient he normally wears 4L at home "he thinks" but states his wife normally manages it. EMS reported that patient is normally on 6L via [place]. Currently patient is on Venti mask @ 40% FiO2.

## 2013-12-19 NOTE — ED Notes (Signed)
Patient here with multiple complaints. Reportedly patient fell twice over the past 3 days. First fall occurred near tub and patient slumped over side injuring abdomen. States that pain was severe but got better until tonight when he slipped and fell again. No LOC during either incident. Primary location of pain is right lateral abdomen, palpation increases pain, ecchymosis present over area. Additionally patient states that he has not had a bowel movement or urinated in 2 days. Reports that he is normally regular.

## 2013-12-19 NOTE — Discharge Instructions (Signed)
-  You do not have any evidence of bleeding or organ injury from your fall. -You are likely constipated due to your pain medications, so we have prescribed you a suppository that you can take when you get home to help you have a bowel movement. -You should also start taking Miralax every day to avoid constipation in the future. -You can continue to take your home pain medications to help with the pain from your fall. -Your CT also showed some changes to your aorta that your PCP will need to monitor in the future.  Please discuss these findings with him at your next appointment.

## 2013-12-19 NOTE — ED Notes (Signed)
MD at bedside. 

## 2013-12-19 NOTE — ED Provider Notes (Signed)
CSN: [id number]     Arrival date & time 12/19/13  6644 History   First MD Initiated Contact with Patient 12/19/13 0710     Chief Complaint  Patient presents with  . Abdominal Pain  . Constipation  . Urinary Retention   HPI Evan [name] is a 80 year old man with history of pulmonary fibrosis, anemia, and rheumatoid arthritis presenting after two falls in the last three days.  He reports being in his bathroom three days ago when he turned and fell over the sink and hit his abdomen.  Since that time, he reports increasing sharp RUQ pain.  He also reports not having any bowel movements and being unable to urinate since that time.  He says that he feels the urge to urinate, but he has been unable to go.  He says that he has not been eating much over the last few days.  He denies nausea, vomiting, fever or chills.  He says that he had another fall last night, but "it was a minor incident."  He denies losing consciousness or hitting his head during either fall.  He took some oxycodone at home, which is controlling his pain.   Past Medical History  Diagnosis Date  . Coronary atherosclerosis of unspecified type of vessel, native or graft 2003  . Rheumatoid arthritis(714.0)   . Other emphysema   . Postinflammatory pulmonary fibrosis   . Acute bronchitis   . Chronic airway obstruction, not elsewhere classified   . Anemia 2010    Iron OK, folate > 20, ferritin   . Hypotension 11/20/2013   Past Surgical History  Procedure Laterality Date  . Coronary angioplasty with stent placement  2003    Stent RCA  . Foot surgery      left  . Vericose vein surgery    . Aortic aneuyrsm thoracic      endovascular brabham  . Cardiac catheterization  2004    LAD OK, CFX 30%, OM2 OK, RCA stent OK, 50% at end of stent, EF 59% med rx   Family History  Problem Relation Age of Onset  . Breast cancer Sister   . Colon cancer Father   . Pulmonary fibrosis Brother   . Heart attack Mother    History   Substance Use Topics  . Smoking status: Former Smoker -- 2.00 packs/day for 40 years    Types: Cigarettes    Quit date: 01/22/1985  . Smokeless tobacco: Never Used  . Alcohol Use: Yes     Comment: rare ETOH use    Review of Systems  Constitutional: Positive for fatigue. Negative for fever, chills, diaphoresis and activity change.  HENT: Negative for congestion, rhinorrhea and sore throat.   Eyes: Negative for visual disturbance.  Respiratory: Negative for chest tightness, shortness of breath and wheezing.   Cardiovascular: Negative for chest pain and palpitations.  Gastrointestinal: Positive for abdominal pain and constipation. Negative for nausea, diarrhea and abdominal distention.  Genitourinary: Positive for decreased urine volume and difficulty urinating. Negative for dysuria and flank pain.  Musculoskeletal: Negative for myalgias, back pain and arthralgias.  Skin: Negative for rash.  Neurological: Positive for weakness (Chronic extermity.). Negative for dizziness, light-headedness and numbness.      Allergies  Review of patient's allergies indicates no known allergies.  Home Medications   Prior to Admission medications   Medication Sig Start Date End Date Taking? Authorizing Provider  ALPRAZolam Prudy Feeler) 0.5 MG tablet Take 0.5 mg by mouth 3 (three) times daily as  needed for anxiety.  11/04/13  Yes Historical Provider, MD  aspirin 81 MG chewable tablet Chew 1 tablet (81 mg total) by mouth daily. 11/21/13  Yes Kathlen Mody, MD  atorvastatin (LIPITOR) 20 MG tablet Take 1 tablet (20 mg total) by mouth at bedtime. 11/21/13  Yes Kathlen Mody, MD  diltiazem (CARDIZEM CD) 120 MG 24 hr capsule Take 1 capsule (120 mg total) by mouth daily. 11/24/13  Yes Lyanne Co, MD  famotidine (PEPCID) 20 MG tablet Take 20 mg by mouth daily. 11/04/13  Yes Historical Provider, MD  feeding supplement, ENSURE COMPLETE, (ENSURE COMPLETE) LIQD Take 237 mLs by mouth 3 (three) times daily between  meals. 11/21/13  Yes Kathlen Mody, MD  losartan (COZAAR) 100 MG tablet Take 1 tablet by mouth daily.   Yes Historical Provider, MD  mirtazapine (REMERON) 30 MG tablet Take 1 tablet by mouth at bedtime.   Yes Historical Provider, MD  oxyCODONE (OXY IR/ROXICODONE) 5 MG immediate release tablet Take 5 mg by mouth every 6 (six) hours as needed (for pain).  11/04/13  Yes Historical Provider, MD  tamsulosin (FLOMAX) 0.4 MG CAPS capsule Take 0.4 mg by mouth daily.  11/04/13  Yes Historical Provider, MD  temazepam (RESTORIL) 15 MG capsule Take 15 mg by mouth at bedtime. Take one tablet daily at bedtime for sleep 10/26/12  Yes Historical Provider, MD  zolpidem (AMBIEN) 5 MG tablet Take 5 mg by mouth at bedtime as needed for sleep.  11/04/13  Yes Historical Provider, MD  bisacodyl (DULCOLAX) 10 MG suppository Place 1 suppository (10 mg total) rectally as needed for moderate constipation. 12/19/13   Luisa Dago, MD  polyethylene glycol (MIRALAX / GLYCOLAX) packet Take 17 g by mouth daily. 12/19/13   Luisa Dago, MD   BP 126/71 mmHg  Pulse 94  Temp(Src) 98.5 F (36.9 C) (Oral)  Resp 21  Ht  (1.88 m)  Wt 175 lb (79.379 kg)  BMI 22.46 kg/m2  SpO2 87% Physical Exam  Constitutional: He is oriented to person, place, and time. No distress.  Cachectic, frail appearing.  HENT:  Head: Normocephalic and atraumatic.  Mouth/Throat: No oropharyngeal exudate.  Eyes: Conjunctivae and EOM are normal. Pupils are equal, round, and reactive to light. No scleral icterus.  Neck: Normal range of motion. Neck supple.  Cardiovascular: Regular rhythm and normal heart sounds.   Tachycardic.  Pulmonary/Chest: Effort normal. No respiratory distress. He has rales (Diffuse.).  Abdominal: Soft. Bowel sounds are normal. He exhibits distension. He exhibits no mass. There is tenderness (RUQ.). There is no rebound and no guarding.  Musculoskeletal: Normal range of motion.  Neurological: He is alert and oriented to  person, place, and time. No cranial nerve deficit. He exhibits normal muscle tone.  Skin: Skin is warm and dry. No rash noted.  Bruising on bilateral forearms. Ecchymosis on RUQ of abdomen.    ED Course  Procedures (including critical care time) Labs Review Labs Reviewed  CBC WITH DIFFERENTIAL - Abnormal; Notable for the following:    WBC 11.7 (*)    RBC 3.58 (*)    Hemoglobin 10.3 (*)    HCT 33.4 (*)    Neutrophils Relative % 83 (*)    Neutro Abs 9.7 (*)    Lymphocytes Relative 7 (*)    All other components within normal limits  COMPREHENSIVE METABOLIC PANEL - Abnormal; Notable for the following:    Sodium 135 (*)    Glucose, Bld 121 (*)    Creatinine, Ser 1.65 (*)  Albumin 3.1 (*)    Alkaline Phosphatase 124 (*)    GFR calc non Af Amer 38 (*)    GFR calc Af Amer 44 (*)    All other components within normal limits  URINALYSIS, ROUTINE W REFLEX MICROSCOPIC - Abnormal; Notable for the following:    APPearance HAZY (*)    All other components within normal limits  I-STAT TROPOININ, ED    Imaging Review Ct Abdomen Pelvis Wo Contrast  12/19/2013   CLINICAL DATA:  Fall, right lateral abdomen pain  EXAM: CT CHEST, ABDOMEN AND PELVIS WITHOUT CONTRAST  TECHNIQUE: Multidetector CT imaging of the chest, abdomen and pelvis was performed following the standard protocol without IV contrast.  COMPARISON:  CT chest dated 09/23/2013. CT abdomen pelvis dated 05/08/2010.  FINDINGS: CT CHEST FINDINGS  No evidence of mediastinal hematoma.  Visualized lungs are notable for pulmonary fibrosis with superimposed moderate to severe emphysema, unchanged. No suspicious pulmonary nodules. No focal consolidation. No pleural effusion or pneumothorax.  Visualized thyroid is unremarkable.  Cardiomegaly. No pericardial effusion. Coronary atherosclerosis. Atherosclerotic calcifications of the aortic arch. Ectasia of the ascending thoracic aorta, measuring 4.3 cm (series 201/image 28).  Small mediastinal lymph  nodes, including a 15 mm short axis right paratracheal node (series 201/ image 25), unchanged.  Mild degenerative changes of the thoracic spine. No fracture is seen.  CT ABDOMEN AND PELVIS FINDINGS  Hepatobiliary: Unenhanced liver is unremarkable.  Gallbladder is grossly unremarkable. No intrahepatic or extrahepatic ductal dilatation.  Pancreas: Within normal limits.  Spleen: Within normal limits.  Adrenals/Urinary Tract: Adrenal glands are unremarkable.  6 mm posterior interpolar hemorrhagic left renal cyst (series 201/image 70).  4 mm nonobstructing interpolar left renal calculus (series 201/image 70). 3 mm nonobstructing right upper pole renal calculus (series 201/ image 69). No hydronephrosis.  No ureteral or bladder calculi.  Bladder is notable for a small left anterolateral bladder diverticulum (series 201/ image 112).  Stomach/Bowel: Some is unremarkable.  No evidence of bowel obstruction.  Sigmoid diverticulosis, without evidence of diverticulitis.  Vascular/Lymphatic: Atherosclerotic calcifications of the abdominal aorta and branch vessels.  Aortobiiliac stents. Bilateral common iliac and left external iliac stents.  Left internal iliac artery embolization. 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  No suspicious abdominopelvic lymphadenopathy.  Reproductive: Prostate is grossly unremarkable.  Other: No abdominopelvic ascites.  No hemoperitoneum or free air.  Musculoskeletal: Degenerative changes of the lumbar spine, most prominent at L5-S1. No fracture is seen.  IMPRESSION: No evidence of traumatic injury to the chest, abdomen, or pelvis.  Pulmonary fibrosis with emphysema.  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 Recommend semi-annual imaging followup by CTA or MRA and referral to cardiothoracic surgery if not already obtained. This recommendation follows 2010 ACCF/AHA/AATS/ACR/ASA/SCA/SCAI/SIR/STS/SVM Guidelines for the Diagnosis and Management of Patients With Thoracic Aortic Disease.  Circulation. 2010; 121: W263-Z858  Prior abdominal aortic aneurysm repair. Additional vascular stents, as above. 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  Numerous additional chronic ancillary findings, as above.   Electronically Signed   By: Charline Bills M.D.   On: 12/19/2013 10:02   Dg Abd 1 View  12/19/2013   CLINICAL DATA:  Abdominal distension, gaseous.  EXAM: ABDOMEN - 1 VIEW  COMPARISON:  02/19/2012  FINDINGS: Single view of the abdomen demonstrates an aortic stent graft. Embolization coils in the region of the left internal iliac artery. There are gas-filled loops of small bowel and colon. Small bowel loops are markedly distended measuring up to 4.4 cm. Moderate stool burden  in the colon. Severe disc space narrowing along the left side of L3-L4.  IMPRESSION: Gas-filled loops of small and large bowel with moderate stool burden. Differential diagnosis includes an ileus versus distal colonic obstruction.   Electronically Signed   By: Richarda Overlie M.D.   On: 12/19/2013 08:22   Ct Chest Wo Contrast  12/19/2013   CLINICAL DATA:  Fall, right lateral abdomen pain  EXAM: CT CHEST, ABDOMEN AND PELVIS WITHOUT CONTRAST  TECHNIQUE: Multidetector CT imaging of the chest, abdomen and pelvis was performed following the standard protocol without IV contrast.  COMPARISON:  CT chest dated 09/23/2013. CT abdomen pelvis dated 05/08/2010.  FINDINGS: CT CHEST FINDINGS  No evidence of mediastinal hematoma.  Visualized lungs are notable for pulmonary fibrosis with superimposed moderate to severe emphysema, unchanged. No suspicious pulmonary nodules. No focal consolidation. No pleural effusion or pneumothorax.  Visualized thyroid is unremarkable.  Cardiomegaly. No pericardial effusion. Coronary atherosclerosis. Atherosclerotic calcifications of the aortic arch. Ectasia of the ascending thoracic aorta, measuring 4.3 cm (series 201/image 28).  Small mediastinal lymph nodes, including a 15 mm short axis right paratracheal node  (series 201/ image 25), unchanged.  Mild degenerative changes of the thoracic spine. No fracture is seen.  CT ABDOMEN AND PELVIS FINDINGS  Hepatobiliary: Unenhanced liver is unremarkable.  Gallbladder is grossly unremarkable. No intrahepatic or extrahepatic ductal dilatation.  Pancreas: Within normal limits.  Spleen: Within normal limits.  Adrenals/Urinary Tract: Adrenal glands are unremarkable.  6 mm posterior interpolar hemorrhagic left renal cyst (series 201/image 70).  4 mm nonobstructing interpolar left renal calculus (series 201/image 70). 3 mm nonobstructing right upper pole renal calculus (series 201/ image 69). No hydronephrosis.  No ureteral or bladder calculi.  Bladder is notable for a small left anterolateral bladder diverticulum (series 201/ image 112).  Stomach/Bowel: Some is unremarkable.  No evidence of bowel obstruction.  Sigmoid diverticulosis, without evidence of diverticulitis.  Vascular/Lymphatic: Atherosclerotic calcifications of the abdominal aorta and branch vessels.  Aortobiiliac stents. Bilateral common iliac and left external iliac stents.  Left internal iliac artery embolization. 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  No suspicious abdominopelvic lymphadenopathy.  Reproductive: Prostate is grossly unremarkable.  Other: No abdominopelvic ascites.  No hemoperitoneum or free air.  Musculoskeletal: Degenerative changes of the lumbar spine, most prominent at L5-S1. No fracture is seen.  IMPRESSION: No evidence of traumatic injury to the chest, abdomen, or pelvis.  Pulmonary fibrosis with emphysema.  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 Recommend semi-annual imaging followup by CTA or MRA and referral to cardiothoracic surgery if not already obtained. This recommendation follows 2010 ACCF/AHA/AATS/ACR/ASA/SCA/SCAI/SIR/STS/SVM Guidelines for the Diagnosis and Management of Patients With Thoracic Aortic Disease. Circulation. 2010; 121: C144-Y185  Prior abdominal aortic  aneurysm repair. Additional vascular stents, as above. 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  Numerous additional chronic ancillary findings, as above.   Electronically Signed   By: Charline Bills M.D.   On: 12/19/2013 10:02   Dg Chest Port 1 View  12/19/2013   CLINICAL DATA:  Abdominal pain and distention.  Urinary retention.  EXAM: PORTABLE CHEST - 1 VIEW  COMPARISON:  11/24/2013  FINDINGS: Again noted are chronic changes of pulmonary fibrosis. Heart size and pulmonary vascularity are within normal limits considering the AP portable technique.  No acute abnormalities.  IMPRESSION: No acute abnormalities.  Chronic changes of pulmonary fibrosis.   Electronically Signed   By: Geanie Cooley M.D.   On: 12/19/2013 08:19     EKG Interpretation  Date/Time:  Saturday December 19 2013 07:00:24 EST Ventricular Rate:  104 PR Interval:  198 QRS Duration: 102 QT Interval:  358 QTC Calculation: 471 R Axis:   -51 Text Interpretation:  Sinus tachycardia Left anterior fascicular block  Abnormal R-wave progression, late transition Borderline T abnormalities,  inferior leads No significant change since last tracing Confirmed by  DOCHERTY  MD, MEGAN ([id number]) on 12/19/2013 7:17:02 AM      MDM   Final diagnoses:  Abdominal distension (gaseous)  Fall   Concern for large organ injury with blunt trauma to abdomen and increasing pain with some distension on exam.  Also, concern for urinary retention and/or bowel obstruction with no bowel movement or urination for 3 days reportedly.  No evidence of head trauma.  Will check CMP, CBC, and get bladder scan.  Also x-rays of abdomen and chest with possible CT afterwards.  9:14 am: Abdominal film concerning for obstruction, chronic pulmonary fibrosis on chest x-ray.  Ordered CT CAP.  AKI on CMP, so will do non-contrast.  11:11 am:  ~300 ml out after placing Foley, unlikely to be retaining urine, so will remove.  Told to return if unable to void or develops  suprapubic pain.  CT with no evidence of injury.  Ectasia of ascending aorta, which patient says is known, PCP will follow up.  Likely some degree of constipation from his pain medications.  Patient reports feeling well currently.  Will discharge with dulcolax suppository and Miralax daily to prevent constipation.  He can continue to take his home pain meds.  Luisa Dago, MD 12/19/13 1145  Nelia Shi, MD 12/20/13 [phone]

## 2013-12-19 NOTE — ED Notes (Signed)
Patient transported to CT 

## 2013-12-19 NOTE — ED Notes (Signed)
Contacted family

## 2014-01-18 ENCOUNTER — Telehealth (HOSPITAL_COMMUNITY): Payer: Self-pay

## 2014-01-18 NOTE — Telephone Encounter (Signed)
Called and spoke with patient regarding entrance into the pulmonary rehab program.  Patient stated he is currently receiving [facility] services. Requested that patient call back when he has been discharged from [facility] to discuss admission into the pulmonary rehabilitation exercise program.

## 2014-02-15 ENCOUNTER — Telehealth (HOSPITAL_COMMUNITY): Payer: Self-pay

## 2014-02-15 NOTE — Telephone Encounter (Signed)
Called patient in regards to pulmonary rehab. Patient verbalized "he was not interested in the program". Encouraged patient to call pulmonary rehab dept if he changed his mind and to discuss his feelings about pulmonary rehab with his pulmonologist.

## 2014-03-31 ENCOUNTER — Other Ambulatory Visit: Payer: Self-pay | Admitting: Nurse Practitioner

## 2014-03-31 ENCOUNTER — Ambulatory Visit
Admission: RE | Admit: 2014-03-31 | Discharge: 2014-03-31 | Disposition: A | Discharge status: Home / Self Care | Payer: Medicare Other | Source: Ambulatory Visit | Attending: Nurse Practitioner | Admitting: Nurse Practitioner

## 2014-03-31 DIAGNOSIS — K5900 Constipation, unspecified: Secondary | ICD-10-CM

## 2014-05-25 ENCOUNTER — Other Ambulatory Visit: Payer: Self-pay | Admitting: Internal Medicine

## 2014-05-25 DIAGNOSIS — N62 Hypertrophy of breast: Secondary | ICD-10-CM

## 2014-05-26 ENCOUNTER — Other Ambulatory Visit: Payer: Self-pay | Admitting: Internal Medicine

## 2014-05-26 ENCOUNTER — Ambulatory Visit
Admission: RE | Admit: 2014-05-26 | Discharge: 2014-05-26 | Disposition: A | Discharge status: Home / Self Care | Payer: Medicare Other | Source: Ambulatory Visit | Attending: Internal Medicine | Admitting: Internal Medicine

## 2014-05-26 DIAGNOSIS — M542 Cervicalgia: Secondary | ICD-10-CM

## 2014-05-28 ENCOUNTER — Other Ambulatory Visit: Payer: Medicare Other

## 2014-06-02 ENCOUNTER — Ambulatory Visit
Admission: RE | Admit: 2014-06-02 | Discharge: 2014-06-02 | Disposition: A | Discharge status: Home / Self Care | Payer: Medicare Other | Source: Ambulatory Visit | Attending: Internal Medicine | Admitting: Internal Medicine

## 2014-06-02 DIAGNOSIS — N62 Hypertrophy of breast: Secondary | ICD-10-CM

## 2014-08-26 NOTE — Patient Outreach (Signed)
[facility]) Care Management  08/26/2014  Evan [name] 1931-04-18 [id number]   Referral from High Risk List, assigned Donato Schultz, RN to outreach.  [name]. [name] [facility] Care Management [facility] CM Assistant Phone: [phone] Fax: [phone]

## 2014-09-08 ENCOUNTER — Other Ambulatory Visit: Payer: Self-pay

## 2014-09-08 NOTE — Patient Outreach (Signed)
[facility]) Care Management  09/08/2014  Nikodem Leadbetter Fitzsimons 1931-03-14 [id number]  Telephonic Care Management Note  Referral Date:  08/26/14 Referral Source:  MD Referral Issue:  CHF, COPD, Lung Disease with H/O 3 admissions, 3 ED visits in the past 12 months per data.   Triage outreach call #1.  Patient not reached.  No answer following several rings and no voice mail option.  RN CM rescheduled for next outreach attempt.    Donato Schultz, RN, BSN, [facility], CCM  [facility] Management Coordinator [phone] Direct [phone] Cell [phone] Office [phone] Fax

## 2014-09-10 ENCOUNTER — Other Ambulatory Visit: Payer: Medicare Other

## 2014-09-10 NOTE — Patient Outreach (Signed)
[facility]) Care Management  09/10/2014  Evan [name] [birth year] [id number]  Telephonic Care Management Note  Referral Date: 08/26/14 Referral Source: MD Referral Issue: CHF, COPD, Lung Disease with H/O 3 admissions, 3 ED visits in the past 12 months per data.   Triage outreach call #2. Patientreached.   Social:  Lives in his home with his wife.  DME:  Home oxygen.  Medications:  More than 10.  States he never misses any dosages and having no medication issues.  THN: Condition:  CHF, COPD Patient refused [facility] services because he feels his CHF is stable and states his biggest problem is his pulmonary fibrosis.  States his breathing has worsened over the past several months due to the heat.  States he has cancelled several MD appt's over the summer due to patient is unable to tolerate the heat and causes his breathing to be too difficult.  States he knows he needs to see his MD and will schedule appt when the temperature cools down.   RN CM discussed admission history and CHF & COPD which can exacerbate as one or the other condition flares.  Patient states he does not need any Health Coach calls or Care Coordination calls at this time but appreciates the call  Consent for service:  NO - Refused  Plan:   RN CM will notify [facility] - case closed RN CM will notify PCP via case closure letter.   Donato Schultz, RN, BSN, [facility], CCM  [facility] Coordinator [phone] Direct [phone] Cell [phone] Office [phone] Fax

## 2014-09-21 NOTE — Patient Outreach (Signed)
[facility]) Care Management  09/21/2014  Kobie Whidby Alessio April 13, 1931 [id number]   Notification from Donato Schultz, RN to close case due to patient refused [facility] Care Management services.  Thanks, [name]. [name] [facility] Care Management [facility] CM Assistant Phone: [phone] Fax: [phone]

## 2014-11-19 ENCOUNTER — Telehealth: Payer: Self-pay | Admitting: Internal Medicine

## 2014-11-19 NOTE — Telephone Encounter (Signed)
Respiratory Therapist, Ron Parker calling stating that patient was 68% on oxygen (not sure how many liters).   Rosanne Ashing wants to know how low does patient's oxygen need to get for them to cease therapy?    MR - please advise

## 2014-11-19 NOTE — Telephone Encounter (Signed)
We have not seen patient in 1 year esp after he went [place]. HE got lost to fu. What PT is this? How much o2 is he on when he drops to 68% and is he symptomatci. Usually for ILD patients if they are handlnig low pulse ox I let them drop to 75% but will also ensure high flow o2   Give him fu to see TP please

## 2014-11-19 NOTE — Telephone Encounter (Signed)
Spoke with Ron Parker RT at [facility], states that PT was initiated through pt's PCP.  States that he will speak to pt about needing to be seen. lmtcb X1 for pt to schedule appt.

## 2014-11-19 NOTE — Telephone Encounter (Signed)
Left message for Evan [name] to call back.

## 2014-11-22 NOTE — Telephone Encounter (Signed)
lmtcb x2 for pt. 

## 2014-11-23 NOTE — Telephone Encounter (Signed)
lmomtcb x 3 for pt 

## 2014-11-24 NOTE — Telephone Encounter (Signed)
LMTCB and will close per triage protocol 

## 2015-02-03 ENCOUNTER — Ambulatory Visit: Payer: Medicare Other | Admitting: Internal Medicine

## 2015-03-11 ENCOUNTER — Emergency Department (HOSPITAL_COMMUNITY)
Admission: EM | Admit: 2015-03-11 | Discharge: 2015-03-23 | Disposition: E | Payer: Medicare Other | Attending: Emergency Medicine | Admitting: Emergency Medicine

## 2015-03-11 DIAGNOSIS — I469 Cardiac arrest, cause unspecified: Secondary | ICD-10-CM

## 2015-03-11 DIAGNOSIS — Z87891 Personal history of nicotine dependence: Secondary | ICD-10-CM | POA: Insufficient documentation

## 2015-03-11 DIAGNOSIS — Z9861 Coronary angioplasty status: Secondary | ICD-10-CM | POA: Insufficient documentation

## 2015-03-11 DIAGNOSIS — Z79899 Other long term (current) drug therapy: Secondary | ICD-10-CM | POA: Insufficient documentation

## 2015-03-11 DIAGNOSIS — Z7982 Long term (current) use of aspirin: Secondary | ICD-10-CM | POA: Insufficient documentation

## 2015-03-11 DIAGNOSIS — J438 Other emphysema: Secondary | ICD-10-CM | POA: Diagnosis not present

## 2015-03-11 DIAGNOSIS — Z862 Personal history of diseases of the blood and blood-forming organs and certain disorders involving the immune mechanism: Secondary | ICD-10-CM | POA: Insufficient documentation

## 2015-03-11 DIAGNOSIS — R002 Palpitations: Secondary | ICD-10-CM | POA: Diagnosis present

## 2015-03-11 NOTE — Code Documentation (Signed)
Ultrasound of cardiac activity completed by Silverio Lay MD.  No activity noted.

## 2015-03-11 NOTE — Code Documentation (Addendum)
Patient time of death occurred at 65. TOD called by Chaney Malling MD

## 2015-03-11 NOTE — ED Provider Notes (Signed)
CSN: [id number]     Arrival date & time 13-Mar-2015  1508 History   First MD Initiated Contact with Patient 03/13/15 1517     Chief Complaint  Patient presents with  . Cardiac Arrest   Patient is a CPR in progress. Per EMS report the patient was on his way to see his doctor when he collapsed in the driveway. Family called EMS and they arrived 15 minutes after down time. No bystander CPR was initiated at that time. On their arrival they initiated CPR. Patient was placed in the ambulance and intubated on scene. He received a total of 11 rounds of epinephrine. PEA arrest noted. However no ROSC achieved.  History was limited based on acuity of patient's status. History was obtained from EMS.  (Consider location/radiation/quality/duration/timing/severity/associated sxs/prior Treatment) The history is provided by the EMS personnel. The history is limited by the condition of the patient.    Past Medical History  Diagnosis Date  . Coronary atherosclerosis of unspecified type of vessel, native or graft 2003  . Rheumatoid arthritis(714.0)   . Other emphysema   . Postinflammatory pulmonary fibrosis   . Acute bronchitis   . Chronic airway obstruction, not elsewhere classified   . Anemia 2010    Iron OK, folate > 20, ferritin   . Hypotension 11/20/2013   Past Surgical History  Procedure Laterality Date  . Coronary angioplasty with stent placement  2003    Stent RCA  . Foot surgery      left  . Vericose vein surgery    . Aortic aneuyrsm thoracic      endovascular brabham  . Cardiac catheterization  2004    LAD OK, CFX 30%, OM2 OK, RCA stent OK, 50% at end of stent, EF 59% med rx   Family History  Problem Relation Age of Onset  . Breast cancer Sister   . Colon cancer Father   . Pulmonary fibrosis Brother   . Heart attack Mother    Social History  Substance Use Topics  . Smoking status: Former Smoker -- 2.00 packs/day for 40 years    Types: Cigarettes    Quit date: 01/22/1985  .  Smokeless tobacco: Never Used  . Alcohol Use: Yes     Comment: rare ETOH use    Review of Systems  Unable to perform ROS: Acuity of condition      Allergies  Review of patient's allergies indicates no known allergies.  Home Medications   Prior to Admission medications   Medication Sig Start Date End Date Taking? Authorizing Provider  ALPRAZolam Prudy Feeler) 0.5 MG tablet Take 0.5 mg by mouth 3 (three) times daily as needed for anxiety.  11/04/13   Historical Provider, MD  aspirin 81 MG chewable tablet Chew 1 tablet (81 mg total) by mouth daily. 11/21/13   Kathlen Mody, MD  atorvastatin (LIPITOR) 20 MG tablet Take 1 tablet (20 mg total) by mouth at bedtime. 11/21/13   Kathlen Mody, MD  bisacodyl (DULCOLAX) 10 MG suppository Place 1 suppository (10 mg total) rectally as needed for moderate constipation. 12/19/13   Donavan Foil, MD  diltiazem (CARDIZEM CD) 120 MG 24 hr capsule Take 1 capsule (120 mg total) by mouth daily. 11/24/13   Azalia Bilis, MD  famotidine (PEPCID) 20 MG tablet Take 20 mg by mouth daily. 11/04/13   Historical Provider, MD  feeding supplement, ENSURE COMPLETE, (ENSURE COMPLETE) LIQD Take 237 mLs by mouth 3 (three) times daily between meals. 11/21/13   Kathlen Mody, MD  losartan (  COZAAR) 100 MG tablet Take 1 tablet by mouth daily.    Historical Provider, MD  mirtazapine (REMERON) 30 MG tablet Take 1 tablet by mouth at bedtime.    Historical Provider, MD  oxyCODONE (OXY IR/ROXICODONE) 5 MG immediate release tablet Take 5 mg by mouth every 6 (six) hours as needed (for pain).  11/04/13   Historical Provider, MD  polyethylene glycol (MIRALAX / GLYCOLAX) packet Take 17 g by mouth daily. 12/19/13   Donavan Foil, MD  tamsulosin (FLOMAX) 0.4 MG CAPS capsule Take 0.4 mg by mouth daily.  11/04/13   Historical Provider, MD  temazepam (RESTORIL) 15 MG capsule Take 15 mg by mouth at bedtime. Take one tablet daily at bedtime for sleep 10/26/12   Historical Provider, MD  zolpidem  (AMBIEN) 5 MG tablet Take 5 mg by mouth at bedtime as needed for sleep.  11/04/13   Historical Provider, MD   Wt 79.379 kg Physical Exam  Constitutional: He appears well-developed and well-nourished. He is intubated.  HENT:  Head: Normocephalic and atraumatic.  Right Ear: External ear normal.  Left Ear: External ear normal.  Eyes:  Pupils 3 mm equal and fixed  Cardiovascular:  Pulseless  Pulmonary/Chest: He is intubated.  Patient intubated. Equal bilateral breath sounds noted on exam.   Abdominal: He exhibits no distension.  Neurological: He is unresponsive. GCS eye subscore is 1. GCS verbal subscore is 1. GCS motor subscore is 1.  Skin: Skin is dry.    ED Course  Procedures (including critical care time) Labs Review Labs Reviewed - No data to display  Imaging Review No results found. I have personally reviewed and evaluated these images and lab results as part of my medical decision-making.   EKG Interpretation None      MDM   Cardiac arrest. CPR in progress for approximately 45 minutes prior to arrival. On EMS arrival patient was in the PEA. Patient was given 11 rounds of epinephrine without ROSC.   On arrival patient still in PEA. Down time is been approximately one hour. Bedside ultrasound revealed no evidence of pericardial effusion or RV dilatation. No cardiac activity noted. Patient was pronounced dead at 1510. Family notified. Chaplain and consulted for assistance.  Patient seen in conjunction with Dr. Silverio Lay.   Final diagnoses:  Cardiac arrest [facility])        Drema Pry, MD 04-05-15 1649  Richardean Canal, MD 2015/04/05 [phone]

## 2015-03-11 NOTE — ED Notes (Signed)
Pts belongings put in a bag and given to family

## 2015-03-11 NOTE — ED Notes (Signed)
Pt witnessed arrest by family.  CPR initiated by fire upon arrival.  Total downtime aprox. 1hr and total CPR time of .  11epi given PTA- pt remained PEA.

## 2015-03-23 DEATH — deceased

## 2016-06-22 IMAGING — CR DG CHEST 1V PORT
1 series · 1 of 1 positions shown · non-contrast
Comparison: [DATE].

CLINICAL DATA: Shortness of breath.  Tachycardia .

EXAM:
PORTABLE CHEST - 1 VIEW

[AP]
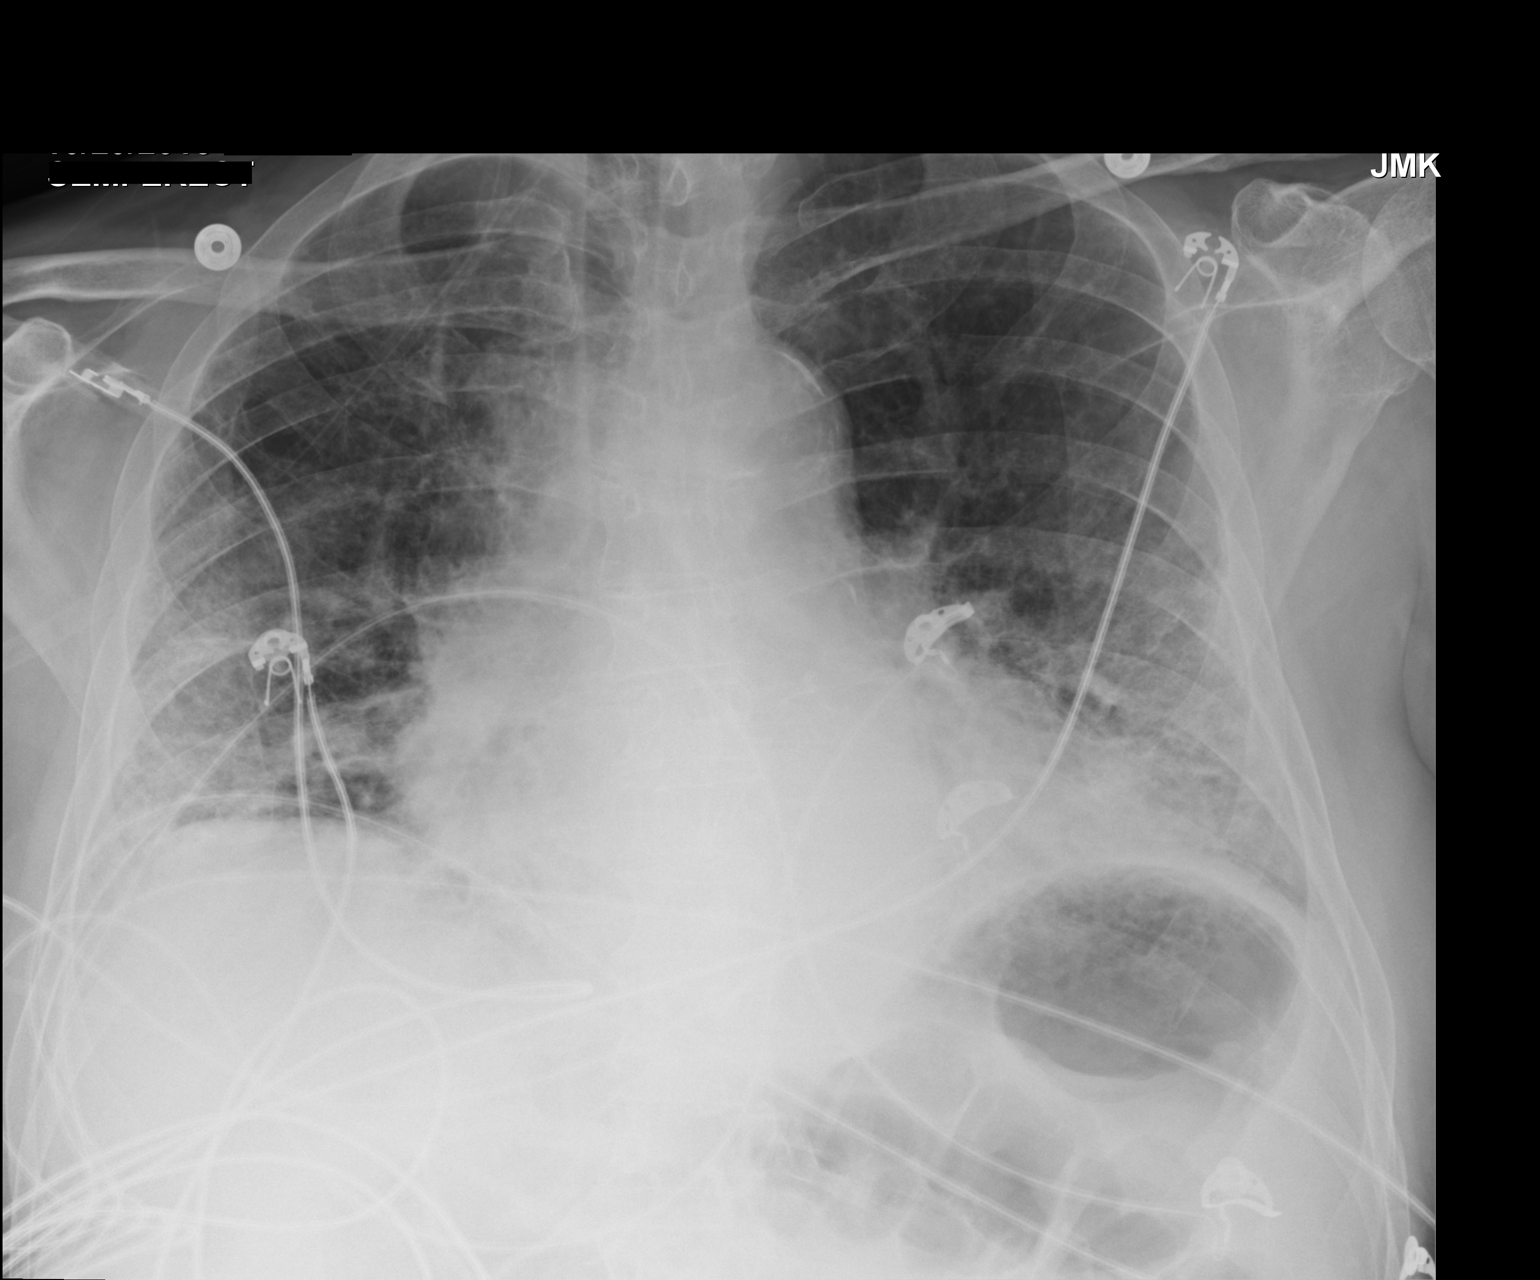

[1 of 1 positions shown; findings below may reference images not displayed]

FINDINGS: Mediastinal structures normal. Cardiomegaly with normal pulmonary
vascularity. Again noted are chronic interstitial changes consistent
with chronic interstitial lung disease. Underlying pneumonitis or
pulmonary interstitial edema cannot be completely excluded . No
prominent pleural effusion. No pneumothorax. No acute bony
abnormality.
IMPRESSION: 1. Stable cardiomegaly.  No pulmonary venous congestion.
2. Again noted are changes of chronic interstitial lung disease.
Underlying pneumonitis or pulmonary interstitial edema cannot be
completely excluded.
